# Patient Record
Sex: Female | Born: 2000
Health system: Southern US, Community
[De-identification: ages and names within clinical notes are randomized; demographics above are authoritative.]

## PROBLEM LIST (undated history)

## (undated) DIAGNOSIS — F419 Anxiety disorder, unspecified: Secondary | ICD-10-CM

## (undated) HISTORY — PX: FOOT SURGERY: SHX648

## (undated) HISTORY — PX: ADENOIDECTOMY: SHX5191

---
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
## 2021-01-30 ENCOUNTER — Ambulatory Visit (INDEPENDENT_AMBULATORY_CARE_PROVIDER_SITE_OTHER): Payer: BC Managed Care – PPO | Admitting: Psychology

## 2021-01-30 DIAGNOSIS — F4322 Adjustment disorder with anxiety: Secondary | ICD-10-CM | POA: Diagnosis not present

## 2021-02-24 ENCOUNTER — Ambulatory Visit: Payer: Self-pay | Admitting: Psychology

## 2021-03-08 ENCOUNTER — Ambulatory Visit (INDEPENDENT_AMBULATORY_CARE_PROVIDER_SITE_OTHER): Payer: BC Managed Care – PPO | Admitting: Psychology

## 2021-03-08 DIAGNOSIS — F4322 Adjustment disorder with anxiety: Secondary | ICD-10-CM | POA: Diagnosis not present

## 2021-04-05 ENCOUNTER — Ambulatory Visit (INDEPENDENT_AMBULATORY_CARE_PROVIDER_SITE_OTHER): Payer: BC Managed Care – PPO | Admitting: Psychology

## 2021-04-05 DIAGNOSIS — F4322 Adjustment disorder with anxiety: Secondary | ICD-10-CM | POA: Diagnosis not present

## 2021-05-03 ENCOUNTER — Ambulatory Visit (INDEPENDENT_AMBULATORY_CARE_PROVIDER_SITE_OTHER): Payer: BC Managed Care – PPO | Admitting: Psychology

## 2021-05-03 DIAGNOSIS — F4322 Adjustment disorder with anxiety: Secondary | ICD-10-CM | POA: Diagnosis not present

## 2021-05-31 ENCOUNTER — Ambulatory Visit (INDEPENDENT_AMBULATORY_CARE_PROVIDER_SITE_OTHER): Payer: BC Managed Care – PPO | Admitting: Psychology

## 2021-05-31 DIAGNOSIS — F4322 Adjustment disorder with anxiety: Secondary | ICD-10-CM

## 2021-05-31 NOTE — Progress Notes (Signed)
[facility] Counselor/Therapist Progress Note  Patient ID: Alyssa Lopez, MRN: [id number],    Date: 05/31/2021  Time Spent: 10:00am-10:45am   45 minutes   Treatment Type: Individual Therapy  Reported Symptoms: anxiety  Mental Status Exam: Appearance:  Casual     Behavior: Appropriate  Motor: Normal  Speech/Language:  Normal Rate  Affect: Appropriate  Mood: normal  Thought process: normal  Thought content:   WNL  Sensory/Perceptual disturbances:   WNL  Orientation: oriented to person, place, time/date, and situation  Attention: Good  Concentration: Good  Memory: WNL  Fund of knowledge:  Good  Insight:   Good  Judgment:  Good  Impulse Control: Good   Risk Assessment: Danger to Self:  No Self-injurious Behavior: No Danger to Others: No Duty to Warn:no Physical Aggression / Violence:No  Access to Firearms a concern: No  Gang Involvement:No   Subjective: Pt present for face-to-face individual therapy via Webex.   Pt consented to telehealth video session due to COVID 19 pandemic. Location of pt: home. Location of therapist: home office. Pt talked about school.  She is done for the semester.  Pt is happy with her grades which are all A's.  She has a month off for Christmas break.     Pt plans to relax and start going to the gym during break.   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   Pt talked about a social event where she had social anxiety.  She was with a different friends and went out to a club.   Addressed what pt experienced and helped with calming strategies.  Worked with pt on additional thought reframing and self care strategies.    Pt talked about being intentional about noticing the progress she is making.  She writes down her successes to remind herself of when she has times that she feels anxious.   Pt talked about her family's Christmas plans to visit family in [place].  Pt's father gets anxious about being with his parents.   It can be a stressful time.  Addressed how pt copes with the stress.     Provided supportive counseling.     Interventions: Cognitive Behavioral Therapy and Insight-Oriented  Diagnosis: F43.22  Plan: See pt's Treatment Plan for anxiety in Therapy Charts.  (Treatment Plan Target Date: 03/08/2022) Pt is progressing with treatment goals.   Plan to continue to see pt monthly.     , LCSW

## 2021-07-05 ENCOUNTER — Ambulatory Visit (INDEPENDENT_AMBULATORY_CARE_PROVIDER_SITE_OTHER): Payer: BC Managed Care – PPO | Admitting: Psychology

## 2021-07-05 DIAGNOSIS — F4322 Adjustment disorder with anxiety: Secondary | ICD-10-CM
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
## 2021-08-09 ENCOUNTER — Ambulatory Visit (INDEPENDENT_AMBULATORY_CARE_PROVIDER_SITE_OTHER): Payer: BC Managed Care – PPO | Admitting: Psychology

## 2021-08-09 DIAGNOSIS — F4322 Adjustment disorder with anxiety: Secondary | ICD-10-CM | POA: Diagnosis not present

## 2021-08-09 NOTE — Progress Notes (Signed)
[facility] Counselor/Therapist Progress Note  Patient ID: ACHSAH MCQUADE, MRN: [id number],    Date: 08/09/2021  Time Spent: 10:00am-10:45am   45 minutes   Treatment Type: Individual Therapy  Reported Symptoms: anxiety  Mental Status Exam: Appearance:  Casual     Behavior: Appropriate  Motor: Normal  Speech/Language:  Normal Rate  Affect: Appropriate  Mood: normal  Thought process: normal  Thought content:   WNL  Sensory/Perceptual disturbances:   WNL  Orientation: oriented to person, place, time/date, and situation  Attention: Good  Concentration: Good  Memory: WNL  Fund of knowledge:  Good  Insight:   Good  Judgment:  Good  Impulse Control: Good   Risk Assessment: Danger to Self:  No Self-injurious Behavior: No Danger to Others: No Duty to Warn:no Physical Aggression / Violence:No  Access to Firearms a concern: No  Gang Involvement:No   Subjective: Pt present for face-to-face individual therapy via Webex.   Pt consented to telehealth video session due to COVID 19 pandemic. Location of pt: home. Location of therapist: home office. Pt states she has been doing well.   School and work are going well.   Pt talked about feeling stressed bc she was chosen for jury duty.  She has felt anxious but is trying to not think about it to reduce the anticipatory anxiety.  Addressed pt's anxiety and worked on Regulatory affairs officer.   Pt talked about having her first doctor's appointment at her new doctor tomorrow and will go by herself so she is a little nervous about that.   Pt is use to her mother going with her when she went to the pediatrician and her mother would talk for her.  Addressed how pt can prepare for her appointment by writing things down ahead of time.  Pt talked about "adulting" and how she does not like it.   Worked with pt on thought reframing and self care strategies.    Provided supportive counseling.     Interventions: Cognitive Behavioral Therapy  and Insight-Oriented  Diagnosis: F43.22  Plan: See pt's Treatment Plan for anxiety in Therapy Charts.  (Treatment Plan Target Date: 03/08/2022) Pt is progressing toward treatment goals.   Plan to continue to see pt monthly.     , LCSW
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
## 2021-09-06 ENCOUNTER — Ambulatory Visit (INDEPENDENT_AMBULATORY_CARE_PROVIDER_SITE_OTHER): Payer: BC Managed Care – PPO | Admitting: Psychology

## 2021-09-06 DIAGNOSIS — F4322 Adjustment disorder with anxiety: Secondary | ICD-10-CM | POA: Diagnosis not present

## 2021-09-06 NOTE — Progress Notes (Signed)
[facility] Counselor/Therapist Progress Note ? ?Patient ID: Alyssa Lopez, MRN: [id number],   ? ?Date: 09/06/2021 ? ?Time Spent: 10:00am-10:45am   45 minutes  ? ?Treatment Type: Individual Therapy ? ?Reported Symptoms: anxiety ? ?Mental Status Exam: ?Appearance:  Casual     ?Behavior: Appropriate  ?Motor: Normal  ?Speech/Language:  Normal Rate  ?Affect: Appropriate  ?Mood: normal  ?Thought process: normal  ?Thought content:   WNL  ?Sensory/Perceptual disturbances:   WNL  ?Orientation: oriented to person, place, time/date, and situation  ?Attention: Good  ?Concentration: Good  ?Memory: WNL  ?Fund of knowledge:  Good  ?Insight:   Good  ?Judgment:  Good  ?Impulse Control: Good  ? ?Risk Assessment: ?Danger to Self:  No ?Self-injurious Behavior: No ?Danger to Others: No ?Duty to Warn:no ?Physical Aggression / Violence:No  ?Access to Firearms a concern: No  ?Gang Involvement:No  ? ?Subjective: Pt present for face-to-face individual therapy via Webex.   Pt consented to telehealth video session due to COVID 19 pandemic. ?Location of pt: home. ?Location of therapist: home office. ?Pt talked about going to the mountains with her friends.   She had a nice time and is being more social. ?Pt talked about work and school.   Both are going well.   ?Pt has felt anxious about trying to figure out her plans for her life.  She is not sure what she wants her major to be.  She knows she wants to work in the medical field but is not sure what area.   Pt may be interested in physical therapy or nursing.  Addressed pt's anxiety about figuring out what she wants to do.  Addressed that she is comparing herself to others and being too hard on herself.   ?Worked on calming strategies.   ?Worked with pt on thought reframing and self care strategies.    ?Provided supportive counseling.    ? ?Interventions: Cognitive Behavioral Therapy and Insight-Oriented ? ?Diagnosis: F43.22 ? ?Plan: See pt's Treatment Plan for anxiety in  Therapy Charts.  (Treatment Plan Target Date: 03/08/2022) ?Pt is progressing toward treatment goals.   ?Plan to continue to see pt monthly.   ? ? , LCSW ? ? ? ?

## 2021-10-04 ENCOUNTER — Ambulatory Visit (INDEPENDENT_AMBULATORY_CARE_PROVIDER_SITE_OTHER): Payer: BC Managed Care – PPO | Admitting: Psychology

## 2021-10-04 DIAGNOSIS — F4322 Adjustment disorder with anxiety: Secondary | ICD-10-CM | POA: Diagnosis not present

## 2021-10-04 NOTE — Progress Notes (Signed)
[facility] Counselor/Therapist Progress Note ? ?Patient ID: Alyssa Lopez, MRN: [id number],   ? ?Date: 10/04/2021 ? ?Time Spent: 10:00am-10:45am   45 minutes  ? ?Treatment Type: Individual Therapy ? ?Reported Symptoms: anxiety ? ?Mental Status Exam: ?Appearance:  Casual     ?Behavior: Appropriate  ?Motor: Normal  ?Speech/Language:  Normal Rate  ?Affect: Appropriate  ?Mood: normal  ?Thought process: normal  ?Thought content:   WNL  ?Sensory/Perceptual disturbances:   WNL  ?Orientation: oriented to person, place, time/date, and situation  ?Attention: Good  ?Concentration: Good  ?Memory: WNL  ?Fund of knowledge:  Good  ?Insight:   Good  ?Judgment:  Good  ?Impulse Control: Good  ? ?Risk Assessment: ?Danger to Self:  No ?Self-injurious Behavior: No ?Danger to Others: No ?Duty to Warn:no ?Physical Aggression / Violence:No  ?Access to Firearms a concern: No  ?Gang Involvement:No  ? ?Subjective: Pt present for face-to-face individual therapy via Webex.   Pt consented to telehealth video session due to COVID 19 pandemic. ?Location of pt: home. ?Location of therapist: home office. 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   They will work around her college schedule so she can still take classes.   ?Pt will work 7-3:30 4 days a week and then 7-12 one day a week.   Pt feels some anxiety about the new schedule.   Addressed pt's anxiety.    ?Worked on calming strategies.   ?Pt talked about having some periods of feeling "down".  It tends to happen when she has too much time on her own.  Addressed the triggers and how pt tends to be hard on herself.   ?Worked with pt on thought reframing and self care strategies.    ?Provided supportive counseling.    ? ?Interventions: Cognitive Behavioral Therapy and Insight-Oriented ? ?Diagnosis: F43.22 ? ?Plan: See pt's Treatment Plan for anxiety in Therapy Charts.  (Treatment Plan Target Date: 03/08/2022) ?Pt is progressing toward treatment  goals.   ?Plan to continue to see pt monthly.   ? ? , LCSW ? ? ? ?

## 2021-11-01 ENCOUNTER — Ambulatory Visit (INDEPENDENT_AMBULATORY_CARE_PROVIDER_SITE_OTHER): Payer: BC Managed Care – PPO | Admitting: Psychology

## 2021-11-01 DIAGNOSIS — F4322 Adjustment disorder with anxiety: Secondary | ICD-10-CM | POA: Diagnosis not present

## 2021-11-01 NOTE — Progress Notes (Signed)
[facility] Counselor/Therapist Progress Note ? ?Patient ID: Alyssa Lopez, MRN: [id number],   ? ?Date: 11/01/2021 ? ?Time Spent: 10:00am-10:45am   45 minutes  ? ?Treatment Type: Individual Therapy ? ?Reported Symptoms: anxiety ? ?Mental Status Exam: ?Appearance:  Casual     ?Behavior: Appropriate  ?Motor: Normal  ?Speech/Language:  Normal Rate  ?Affect: Appropriate  ?Mood: normal  ?Thought process: normal  ?Thought content:   WNL  ?Sensory/Perceptual disturbances:   WNL  ?Orientation: oriented to person, place, time/date, and situation  ?Attention: Good  ?Concentration: Good  ?Memory: WNL  ?Fund of knowledge:  Good  ?Insight:   Good  ?Judgment:  Good  ?Impulse Control: Good  ? ?Risk Assessment: ?Danger to Self:  No ?Self-injurious Behavior: No ?Danger to Others: No ?Duty to Warn:no ?Physical Aggression / Violence:No  ?Access to Firearms a concern: No  ?Gang Involvement:No  ? ?Subjective: Pt present for face-to-face individual therapy via Webex.   Pt consented to telehealth video session due to COVID 19 pandemic. ?Location of pt: home. ?Location of therapist: home office. ?Pt talked about her spring semester of college being over.  She did well and got all A's.   She starts summer classes next week.  She will take 2 classes.   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    Addressed pt's anxiety.   Worked on calming strategies.  Worked on thought reframing to address the anticipatory anxiety.   ?Pt is excited about summer.  She has a family trip to [place] in July.   Pt is excited to hang out with her friends at her pool this summer as well.   ?Pt talked about her relationship with her friends.  She hangs out with her sister and friends at times and pt feels like they just see her as "the little sister."   She is trying to establish her own friendships with them.    ?Pt talked about leaving her job at [employer].   She will miss the kids.  They are very attached to her and she has done a good job and is very valued there.  Addressed how pt will say goodbye to them.   ?Worked with pt on self care strategies.    ?Provided supportive counseling.    ? ?Interventions: Cognitive Behavioral Therapy and Insight-Oriented ? ?Diagnosis: F43.22 ? ?Plan: See pt's Treatment Plan for anxiety in Therapy Charts.  (Treatment Plan Target Date: 03/08/2022) ?Pt is progressing toward treatment goals.   ?Plan to continue to see pt monthly.   ? ? , LCSW ? ? ?

## 2021-11-29 ENCOUNTER — Ambulatory Visit: Payer: BC Managed Care – PPO | Admitting: Psychology

## 2021-12-11 ENCOUNTER — Ambulatory Visit (INDEPENDENT_AMBULATORY_CARE_PROVIDER_SITE_OTHER): Payer: BC Managed Care – PPO | Admitting: Psychology

## 2021-12-11 DIAGNOSIS — F4322 Adjustment disorder with anxiety: Secondary | ICD-10-CM | POA: Diagnosis not present

## 2022-01-08 ENCOUNTER — Ambulatory Visit (INDEPENDENT_AMBULATORY_CARE_PROVIDER_SITE_OTHER): Payer: BC Managed Care – PPO | Admitting: Psychology

## 2022-01-08 DIAGNOSIS — F4322 Adjustment disorder with anxiety: Secondary | ICD-10-CM

## 2022-01-08 NOTE — Progress Notes (Signed)
[facility] Counselor/Therapist Progress Note  Patient ID: Alyssa Lopez, MRN: [id number],    Date: 01/08/2022  Time Spent: 4:00pm-4:45pm     45 minutes   Treatment Type: Individual Therapy  Reported Symptoms: anxiety  Mental Status Exam: Appearance:  Casual     Behavior: Appropriate  Motor: Normal  Speech/Language:  Normal Rate  Affect: Appropriate  Mood: normal  Thought process: normal  Thought content:   WNL  Sensory/Perceptual disturbances:   WNL  Orientation: oriented to person, place, time/date, and situation  Attention: Good  Concentration: Good  Memory: WNL  Fund of knowledge:  Good  Insight:   Good  Judgment:  Good  Impulse Control: Good   Risk Assessment: Danger to Self:  No Self-injurious Behavior: No Danger to Others: No Duty to Warn:no Physical Aggression / Violence:No  Access to Firearms a concern: No  Gang Involvement:No   Subjective: Pt present for face-to-face individual therapy via Webex.   Pt consented to telehealth video session due to COVID 19 pandemic. Location of pt: home. Location of therapist: home office. Pt talked about work.  In general work has been going well. Pt has written her graduation speech and is glad that she did not avoid doing it bc of anxiety.   Pt is having a friend/coworker help with reviewing  editing her speech.  Pt still has some anticipatory anxiety about having to give the speech and get up in front of people and speak.   Worked on calming strategies for pt's anticipatory anxiety.   Pt has finished her summer classes and won't start fall classes until August.   Worked with pt on self care strategies.    Provided supportive counseling.     Interventions: Cognitive Behavioral Therapy and Insight-Oriented  Diagnosis: F43.22  Plan: See pt's Treatment Plan for anxiety in Therapy Charts.  (Treatment Plan Target Date: 03/08/2022) Pt is progressing toward treatment goals.   Plan to continue to see pt  monthly.     , LCSW

## 2022-01-15 ENCOUNTER — Encounter (HOSPITAL_BASED_OUTPATIENT_CLINIC_OR_DEPARTMENT_OTHER): Payer: Self-pay

## 2022-01-15 DIAGNOSIS — R1031 Right lower quadrant pain: Secondary | ICD-10-CM | POA: Insufficient documentation

## 2022-01-15 DIAGNOSIS — D72829 Elevated white blood cell count, unspecified: Secondary | ICD-10-CM | POA: Diagnosis not present

## 2022-01-15 LAB — COMPREHENSIVE METABOLIC PANEL
ALT: 18 U/L (ref 0–44)
AST: 17 U/L (ref 15–41)
Albumin: 4.4 g/dL (ref 3.5–5.0)
Alkaline Phosphatase: 100 U/L (ref 38–126)
Anion gap: 12 (ref 5–15)
BUN: 10 mg/dL (ref 6–20)
CO2: 24 mmol/L (ref 22–32)
Calcium: 9.5 mg/dL (ref 8.9–10.3)
Chloride: 101 mmol/L (ref 98–111)
Creatinine, Ser: 0.69 mg/dL (ref 0.44–1.00)
GFR, Estimated: >60 mL/min (ref >60)
Glucose, Bld: 93 mg/dL (ref 70–99)
Potassium: 3.6 mmol/L (ref 3.5–5.1)
Sodium: 137 mmol/L (ref 135–145)
Total Bilirubin: 0.8 mg/dL (ref 0.3–1.2)
Total Protein: 6.9 g/dL (ref 6.5–8.1)

## 2022-01-15 LAB — CBC
HCT: 35 % — ABNORMAL LOW (ref 36.0–46.0)
Hemoglobin: 11.4 g/dL — ABNORMAL LOW (ref 12.0–15.0)
MCH: 27.9 pg (ref 26.0–34.0)
MCHC: 32.6 g/dL (ref 30.0–36.0)
MCV: 85.6 fL (ref 80.0–100.0)
Platelets: 291 10*3/uL (ref 150–400)
RBC: 4.09 MIL/uL (ref 3.87–5.11)
RDW: 14.2 % (ref 11.5–15.5)
WBC: 14.7 10*3/uL — ABNORMAL HIGH (ref 4.0–10.5)
nRBC: 0 % (ref 0.0–0.2)

## 2022-01-15 LAB — LIPASE, BLOOD: Lipase: <10 U/L — ABNORMAL LOW (ref 11–51)

## 2022-01-15 NOTE — ED Triage Notes (Signed)
Pt reports RLQ pain x3 days. Reports developing a fever today. Denies diarrhea and emesis. Reports some nausea. Pt A&Ox4 at time of triage.

## 2022-01-16 ENCOUNTER — Emergency Department (HOSPITAL_BASED_OUTPATIENT_CLINIC_OR_DEPARTMENT_OTHER): Payer: BC Managed Care – PPO

## 2022-01-16 ENCOUNTER — Emergency Department (HOSPITAL_BASED_OUTPATIENT_CLINIC_OR_DEPARTMENT_OTHER)
Admission: EM | Admit: 2022-01-16 | Discharge: 2022-01-16 | Disposition: A | Discharge status: Home / Self Care | Payer: BC Managed Care – PPO | Attending: Emergency Medicine | Admitting: Emergency Medicine

## 2022-01-16 ENCOUNTER — Encounter (HOSPITAL_BASED_OUTPATIENT_CLINIC_OR_DEPARTMENT_OTHER): Payer: Self-pay | Admitting: Radiology

## 2022-01-16 DIAGNOSIS — N10 Acute tubulo-interstitial nephritis: Secondary | ICD-10-CM

## 2022-01-16 LAB — URINALYSIS, ROUTINE W REFLEX MICROSCOPIC
Bilirubin Urine: NEGATIVE
Glucose, UA: NEGATIVE mg/dL
Ketones, ur: 40 mg/dL — AB
Nitrite: POSITIVE — AB
Protein, ur: 100 mg/dL — AB
RBC / HPF: >50 RBC/hpf — ABNORMAL HIGH (ref 0–5)
Specific Gravity, Urine: 1.017 (ref 1.005–1.030)
WBC, UA: >50 WBC/hpf — ABNORMAL HIGH (ref 0–5)
pH: 5.5 (ref 5.0–8.0)

## 2022-01-16 LAB — PREGNANCY, URINE: Preg Test, Ur: NEGATIVE

## 2022-01-16 MED ORDER — ONDANSETRON HCL 4 MG/2ML IJ SOLN
4.0000 mg | Freq: Once | INTRAMUSCULAR | Status: AC
  Administered 2022-01-16: 4 mg via INTRAVENOUS
  Filled 2022-01-16: qty 2

## 2022-01-16 MED ORDER — CEPHALEXIN 500 MG PO CAPS: 500.0000 mg | ORAL_CAPSULE | Freq: Three times a day (TID) | ORAL | 0 refills | Status: DC

## 2022-01-16 MED ORDER — SODIUM CHLORIDE 0.9 % IV SOLN
1.0000 g | Freq: Once | INTRAVENOUS | Status: AC
  Administered 2022-01-16: 1 g via INTRAVENOUS
  Filled 2022-01-16: qty 10

## 2022-01-16 MED ORDER — SODIUM CHLORIDE 0.9 % IV BOLUS
1000.0000 mL | Freq: Once | INTRAVENOUS | Status: AC
  Administered 2022-01-16: 1000 mL via INTRAVENOUS

## 2022-01-16 MED ORDER — MORPHINE SULFATE (PF) 4 MG/ML IV SOLN
4.0000 mg | Freq: Once | INTRAVENOUS | Status: AC
  Administered 2022-01-16: 4 mg via INTRAVENOUS
  Filled 2022-01-16: qty 1

## 2022-01-16 MED ORDER — IOHEXOL 300 MG/ML  SOLN
100.0000 mL | Freq: Once | INTRAMUSCULAR | Status: AC | PRN
  Administered 2022-01-16: 80 mL via INTRAVENOUS

## 2022-01-16 NOTE — ED Provider Notes (Signed)
[facility] EMERGENCY DEPT Provider Note   CSN: [id number] Arrival date & time: 01/15/22  1926     History  Chief Complaint  Patient presents with   Abdominal Pain    Alyssa Lopez is a 21 y.o. female.  Patient is a 21 year old female with no significant past medical history.  Patient presenting today with complaints of right lower quadrant pain.  This started 2 days ago in the absence of any injury or trauma.  Pain has been constant and worsening.  It has improved somewhat with ibuprofen.  Earlier today, she started with a fever.  She denies any bowel or bladder complaints.  Last menstrual period was somewhat heavier than normal, but 2 weeks ago.  The history is provided by the patient.       Home Medications Prior to Admission medications   Medication Sig Start Date End Date Taking? Authorizing Provider  Clindamycin-Benzoyl Per, Refr, gel SMARTSIG:Sparingly Topical Every Morning 12/14/19   [provider]  escitalopram (LEXAPRO) 20 MG tablet Take 20 mg by mouth daily. 01/02/20   [provider]  hydrOXYzine (ATARAX/VISTARIL) 10 MG tablet Take 10 mg by mouth daily as needed. 10/14/19   [provider]  ibuprofen (ADVIL,MOTRIN) 100 MG chewable tablet Chew by mouth every 8 (eight) hours as needed.    [provider]  methocarbamol (ROBAXIN) 500 MG tablet Take 1 tablet (500 mg total) by mouth 2 (two) times daily. 04/12/20   Moshe Cipro, NP  SPRINTEC 28 0.25-35 MG-MCG tablet Take 1 tablet by mouth daily. 11/13/19   [provider]  tretinoin (RETIN-A) 0.05 % cream Apply 1 application topically at bedtime. 01/11/20   [provider]      Allergies    Patient has no known allergies.    Review of Systems   Review of Systems  All other systems reviewed and are negative.   Physical Exam Updated Vital Signs BP 113/78   Pulse 100   Temp 99.2 F (37.3 C)   Resp 18   Ht 5\' 2"  (1.575 m)   Wt 81.6 kg   LMP  01/09/2022   SpO2 100%   BMI 32.92 kg/m  Physical Exam Vitals and nursing note reviewed.  Constitutional:      General: She is not in acute distress.    Appearance: She is well-developed. She is not diaphoretic.  HENT:     Head: Normocephalic and atraumatic.  Cardiovascular:     Rate and Rhythm: Normal rate and regular rhythm.     Heart sounds: No murmur heard.    No friction rub. No gallop.  Pulmonary:     Effort: Pulmonary effort is normal. No respiratory distress.     Breath sounds: Normal breath sounds. No wheezing.  Abdominal:     General: Bowel sounds are normal. There is no distension.     Palpations: Abdomen is soft.     Tenderness: There is abdominal tenderness in the right lower quadrant. There is no right CVA tenderness, left CVA tenderness, guarding or rebound.  Musculoskeletal:        General: Normal range of motion.     Cervical back: Normal range of motion and neck supple.  Skin:    General: Skin is warm and dry.  Neurological:     General: No focal deficit present.     Mental Status: She is alert and oriented to person, place, and time.     ED Results / Procedures / Treatments   Labs (all  labs ordered are listed, but only abnormal results are displayed) Labs Reviewed  LIPASE, BLOOD - Abnormal; Notable for the following components:      Result Value   Lipase <10 (*)    All other components within normal limits  CBC - Abnormal; Notable for the following components:   WBC 14.7 (*)    Hemoglobin 11.4 (*)    HCT 35.0 (*)    All other components within normal limits  COMPREHENSIVE METABOLIC PANEL  URINALYSIS, ROUTINE W REFLEX MICROSCOPIC  PREGNANCY, URINE    EKG None  Radiology No results found.  Procedures Procedures    Medications Ordered in ED Medications  sodium chloride 0.9 % bolus 1,000 mL (has no administration in time range)  ondansetron (ZOFRAN) injection 4 mg (has no administration in time range)  morphine (PF) 4 MG/ML injection 4  mg (has no administration in time range)    ED Course/ Medical Decision Making/ A&P  This patient presents to the ED for concern of right lower quadrant pain, this involves an extensive number of treatment options, and is a complaint that carries with it a high risk of complications and morbidity.  The differential diagnosis includes acute appendicitis, tubo-ovarian abscess, ovarian torsion, urinary tract infection, ovarian cyst   Co morbidities that complicate the patient evaluation  None   Additional history obtained:  No additional history or external records needed   Lab Tests:  I Ordered, and personally interpreted labs.  The pertinent results include: Leukocytosis with white cell count of 15,000, but unremarkable metabolic panel.  Urinalysis is consistent with UTI.   Imaging Studies ordered:  I ordered imaging studies including CT scan of the abdomen and pelvis I independently visualized and interpreted imaging which showed no evidence for acute appendicitis, but does show evidence of inflammation around the right kidney suggestive of pyelonephritis I agree with the radiologist interpretation   Cardiac Monitoring: / EKG:  None performed   Consultations Obtained:  No consultations indicated   Problem List / ED Course / Critical interventions / Medication management  Patient presenting with right lower quadrant pain as described in the HPI.  Work-up reveals a leukocytosis, but CT is negative for appendicitis.  It does show evidence consistent with pyelonephritis and urinalysis consistent with UTI.  Patient to be treated with Rocephin and discharged with Keflex.  She is well-appearing and nontoxic and I believe can safely be discharged. I ordered medication including Rocephin for UTI and morphine and Zofran for pain and nausea Reevaluation of the patient after these medicines showed that the patient improved I have reviewed the patients home medicines and have made  adjustments as needed   Social Determinants of Health:  None   Test / Admission - Considered:  Patient to be discharged with Keflex and as needed return.  Final Clinical Impression(s) / ED Diagnoses Final diagnoses:  None    Rx / DC Orders ED Discharge Orders     None         Geoffery Lyons, MD 01/16/22 [phone]

## 2022-01-16 NOTE — ED Notes (Signed)
Pt verbalizes understanding of discharge instructions. Opportunity for questioning and answers were provided. Pt discharged from ED to home with mother.   ? ?

## 2022-01-16 NOTE — Discharge Instructions (Signed)
Begin taking Keflex as prescribed.  Drink plenty of fluids and get plenty of rest.  Return to the emergency department if you develop severe abdominal pain, high fevers, or for other new and concerning symptoms.

## 2022-02-05 ENCOUNTER — Ambulatory Visit (INDEPENDENT_AMBULATORY_CARE_PROVIDER_SITE_OTHER): Payer: BC Managed Care – PPO | Admitting: Psychology

## 2022-02-05 DIAGNOSIS — F4322 Adjustment disorder with anxiety: Secondary | ICD-10-CM

## 2022-02-05 NOTE — Progress Notes (Signed)
[facility] Counselor/Therapist Progress Note  Patient ID: REHMAT MURTAGH, MRN: [id number],    Date: 02/05/2022  Time Spent: 5:00pm-5:45pm     45 minutes   Treatment Type: Individual Therapy  Reported Symptoms: anxiety  Mental Status Exam: Appearance:  Casual     Behavior: Appropriate  Motor: Normal  Speech/Language:  Normal Rate  Affect: Appropriate  Mood: normal  Thought process: normal  Thought content:   WNL  Sensory/Perceptual disturbances:   WNL  Orientation: oriented to person, place, time/date, and situation  Attention: Good  Concentration: Good  Memory: WNL  Fund of knowledge:  Good  Insight:   Good  Judgment:  Good  Impulse Control: Good   Risk Assessment: Danger to Self:  No Self-injurious Behavior: No Danger to Others: No Duty to Warn:no Physical Aggression / Violence:No  Access to Firearms a concern: No  Gang Involvement:No   Subjective: Pt present for face-to-face individual therapy via Webex.   Pt consented to telehealth video session due to COVID 19 pandemic. Location of pt: home. Location of therapist: home office. Pt talked about work.  She had today off bc she worked the weekend.  Pt states work is going well.   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  Pt feels minimal anxiety about the test bc she tends to test well.   Pt started the fall semester.  Pt is taking English, Abnormal Psych, and Biology.  Pt feels like it will be a good semester.   Pt talked about giving her speech last week at graduation.   Pt did not have much anticipatory anxiety until the day of the speech.  This is good progress for pt.  She was very nervous the hour before her speech.  Pt did her speech and she got good feedback about it even though she did not feel like she did well.   Pt's parents were very proud of her.  Pt is proud of herself for doing the speech and not backing out.  Addressed how pt coped with the anxiety.  She coped by  doing deep breathing, adjusting her self talk, and reaching out to her mother for support.   Worked with pt on self care strategies.    Provided supportive counseling.     Interventions: Cognitive Behavioral Therapy and Insight-Oriented  Diagnosis: F43.22  Plan: See pt's Treatment Plan for anxiety in Therapy Charts.  (Treatment Plan Target Date: 03/08/2022) Pt is progressing toward treatment goals.   Plan to continue to see pt monthly.     , LCSW

## 2022-03-12 ENCOUNTER — Ambulatory Visit (INDEPENDENT_AMBULATORY_CARE_PROVIDER_SITE_OTHER): Payer: BC Managed Care – PPO | Admitting: Psychology

## 2022-03-12 DIAGNOSIS — F4322 Adjustment disorder with anxiety: Secondary | ICD-10-CM | POA: Diagnosis not present

## 2022-03-12 NOTE — Progress Notes (Signed)
[facility] Counselor Initial Adult Exam  Name: Alyssa Lopez Date: 03/12/2022 MRN: [id number] DOB: [birth year] PCP: Nelda Marseille, MD  Time spent: 5:00pm - 5:50pm   50 minutes  Guardian/Payee:  n/a    Paperwork requested: No   Reason for Visit /Presenting Problem: Pt present for face-to-face initial assessment update via video Webex.  Pt consents to telehealth video session due to COVID 19 pandemic. Location of pt: home Location of therapist: home office.  Pt has made progress managing her anxiety but still needs therapy to addressed issues as they come up and continue to learn additional coping skills.  Pt continues to have issues at times with anxiety and self esteem.   She is aware of her anxiety being triggered by doing things for the first time and doing new things. She experiences anticipatory anxiety.  Pt does not like being around new groups of people.   Reviewed pt's treatment plan for annual update.  Updated treatment plan and IA.  Plan to meet monthly.   Mental Status Exam: Appearance:   Casual     Behavior:  Appropriate  Motor:  Normal  Speech/Language:   Normal Rate  Affect:  Appropriate  Mood:  normal  Thought process:  normal  Thought content:    WNL  Sensory/Perceptual disturbances:    WNL  Orientation:  oriented to person, place, time/date, and situation  Attention:  Good  Concentration:  Good  Memory:  WNL  Fund of knowledge:   Good  Insight:    Good  Judgment:   Good  Impulse Control:  Good     Reported Symptoms:  stress  Risk Assessment: Danger to Self:  No Self-injurious Behavior: No Danger to Others: No Duty to Warn:no Physical Aggression / Violence:No  Access to Firearms a concern: No  Gang Involvement:No  Patient / guardian was educated about steps to take if suicide or homicide risk level increases between visits: n/a While future psychiatric events cannot be accurately predicted, the patient does not currently require  acute inpatient psychiatric care and does not currently meet [facility] involuntary commitment criteria.  Substance Abuse History: Current substance abuse: No     Past Psychiatric History:   No previous psychological problems have been observed Outpatient Providers:tpt has only worked with this provider.  History of Psych Hospitalization: No  Psychological Testing:  n/a    Abuse History:  Victim of: No.,  n/a    Report needed: No. Victim of Neglect:No. Perpetrator of  n/a   Witness / Exposure to Domestic Violence: No   Protective Services Involvement: No  Witness to [employer] Violence:  No   Family History:  Family History  Problem Relation Age of Onset   Cancer Maternal Grandmother    Cancer Maternal Grandfather     Living situation: the patient lives with their family  Family history of mental health issues: Pt's father's sister is bipolar.  Paternal grandparents have had anxiety.   No substance abuse or child abuse.    Sexual Orientation: Straight  Relationship Status: single  Name of spouse / other:n/a If a parent, number of children / ages:n/a  Support Systems: friends parents  Financial Stress:  No   Income/Employment/Disability: Employment and Supported by [employer] and [employer]  Financial planner: No   Educational History: Education: some college  Religion/Sprituality/World View: Protestant  Any cultural differences that may affect / interfere with treatment:  not applicable   Recreation/Hobbies: reading  Stressors: Occupational concerns    Strengths: Supportive Relationships,  Family, Friends, Hopefulness, Conservator, museum/gallery, and Able to Communicate Effectively  Barriers:  none   Legal History: Pending legal issue / charges: The patient has no significant history of legal issues. History of legal issue / charges:  n/a  Medical History/Surgical History: reviewed No past medical history on file.  Past Surgical History:  Procedure Laterality Date    ADENOIDECTOMY     FOOT SURGERY      Medications: Current Outpatient Medications  Medication Sig Dispense Refill   cephALEXin (KEFLEX) 500 MG capsule Take 1 capsule (500 mg total) by mouth 3 (three) times daily. 21 capsule 0   Clindamycin-Benzoyl Per, Refr, gel SMARTSIG:Sparingly Topical Every Morning     escitalopram (LEXAPRO) 20 MG tablet Take 20 mg by mouth daily.     hydrOXYzine (ATARAX/VISTARIL) 10 MG tablet Take 10 mg by mouth daily as needed.     ibuprofen (ADVIL,MOTRIN) 100 MG chewable tablet Chew by mouth every 8 (eight) hours as needed.     methocarbamol (ROBAXIN) 500 MG tablet Take 1 tablet (500 mg total) by mouth 2 (two) times daily. 20 tablet 0   SPRINTEC 28 0.25-35 MG-MCG tablet Take 1 tablet by mouth daily.     tretinoin (RETIN-A) 0.05 % cream Apply 1 application topically at bedtime.     No current facility-administered medications for this visit.    No Known Allergies  Diagnoses:  F43.22  Plan of Care: Recommend ongoing therapy.  Pt participated in setting treatment goals.  Plan to meet monthly.   Treatment Plan (treatment plan target date:  03/13/2023) Client Abilities/Strengths  Pt is bright, engaging and motivated for therapy.  Client Treatment Preferences  Individual therapy.  Client Statement of Needs  Improve coping skills.  Symptoms  Autonomic hyperactivity (e.g., palpitations, shortness of breath, dry mouth, trouble swallowing, nausea, diarrhea). Excessive and/or unrealistic worry that is difficult to control occurring more days than not for at least 6 months about a number of events or activities. Hypervigilance (e.g., feeling constantly on edge, experiencing concentration difficulties, having trouble falling or staying asleep, exhibiting a general state of irritability). Motor tension (e.g., restlessness, tiredness, shakiness, muscle tension). Problems Addressed  Anxiety Goals 1. Enhance ability to effectively cope with the full variety of life's  worries and anxieties. 2. Learn and implement coping skills that result in a reduction of anxiety and worry, and improved daily functioning. Objective Learn to accept limitations in life and commit to tolerating, rather than avoiding, unpleasant emotions while accomplishing meaningful goals. Target Date: 2023-03-13  Frequency: Monthly Progress: 50 Modality: individual Related Interventions 1. Use techniques from Acceptance and Commitment Therapy to help client accept uncomfortable realities such as lack of complete control, imperfections, and uncertainty and tolerate unpleasant emotions and thoughts in order to accomplish value-consistent goals. Objective Learn and implement problem-solving strategies for realistically addressing worries. Target Date: 2023-03-13  Frequency: Monthly Progress: 50 Modality: individual Related Interventions 1. Assign the client a homework exercise in which he/she problem-solves a current problem.  review, reinforce success, and provide corrective feedback toward improvement. 2. Teach the client problem-solving strategies involving specifically defining a problem, generating options for addressing it, evaluating the pros and cons of each option, selecting and implementing an optional action, and reevaluating and refining the action. Objective Learn and implement calming skills to reduce overall anxiety and manage anxiety symptoms. Target Date: 2023-03-13  Frequency: Monthly Progress: 50 Modality: individual Related Interventions 1. Assign the client to read about progressive muscle relaxation and other calming strategies in relevant books or treatment  manuals (e.g., Progressive Relaxation Training by Twana First; Mastery of Your Anxiety and Worry: Workbook by Earlie Counts). 2. Assign the client homework each session in which he/she practices relaxation exercises daily, gradually applying them progressively from non-anxiety-provoking to  anxiety-provoking situations; review and reinforce success while providing corrective feedback toward improvement. 3. Teach the client calming/relaxation skills (e.g., applied relaxation, progressive muscle relaxation, cue controlled relaxation; mindful breathing; biofeedback) and how to discriminate better between relaxation and tension; teach the client how to apply these skills to his/her daily life. 3. Reduce overall frequency, intensity, and duration of the anxiety so that daily functioning is not impaired. 4. Resolve the core conflict that is the source of anxiety. 5. Stabilize anxiety level while increasing ability to function on a daily basis. Diagnosis :    F43.22  Conditions For Discharge Achievement of treatment goals and objectives.       , LCSW

## 2022-03-13 ENCOUNTER — Ambulatory Visit: Payer: BC Managed Care – PPO | Admitting: Psychology

## 2022-03-13 DIAGNOSIS — F4322 Adjustment disorder with anxiety: Secondary | ICD-10-CM

## 2022-03-13 NOTE — Progress Notes (Signed)
[place] Counselor Initial Adult Exam  Name: Alyssa Lopez Date: 03/13/2022 MRN: [id number] DOB: 01-09-2001 PCP: Einar Gip, MD  Time spent: 5:00pm - 5:45pm   45 minutes  Guardian/Payee:  n/a    Paperwork requested: No   Reason for Visit /Presenting Problem: Pt present for face-to-face initial assessment update via video Webex.  Pt consents to telehealth video session due to COVID 19 pandemic. Location of pt: home Location of therapist: home office.  Pt continues to have issues with anxiety and self esteem.   She is aware of her anxiety being triggered by doing things for the first time and doing new things. She experiences anticipatory anxiety.  Pt does not like being around new groups of people.   Reviewed pt's treatment plan for annual update.   Updated treatment plan and IA.   Pt participated in setting treatment goals.   Plan to meet monthly.    Mental Status Exam: Appearance:   Casual     Behavior:  Appropriate  Motor:  Normal  Speech/Language:   Normal Rate  Affect:  Appropriate  Mood:  normal  Thought process:  normal  Thought content:    WNL  Sensory/Perceptual disturbances:    WNL  Orientation:  oriented to person, place, time/date, and situation  Attention:  Good  Concentration:  Good  Memory:  WNL  Fund of knowledge:   Good  Insight:    Good  Judgment:   Good  Impulse Control:  Good     Reported Symptoms:  anxiety  Risk Assessment: Danger to Self:  No Self-injurious Behavior: No Danger to Others: No Duty to Warn:no Physical Aggression / Violence:No  Access to Firearms a concern: No  Gang Involvement:No  Patient / guardian was educated about steps to take if suicide or homicide risk level increases between visits: n/a While future psychiatric events cannot be accurately predicted, the patient does not currently require acute inpatient psychiatric care and does not currently meet [facility] involuntary commitment  criteria.  Substance Abuse History: Current substance abuse: No     Past Psychiatric History:   Previous psychological history is significant for anxiety Outpatient Providers: , LCSW History of Psych Hospitalization: No  Psychological Testing:  n/a    Abuse History:  Victim of: No.,  n/a    Report needed: No. Victim of Neglect:No. Perpetrator of  n/a   Witness / Exposure to Domestic Violence: No   Protective Services Involvement: No  Witness to [employer] Violence:  No   Family History:  Family History  Problem Relation Age of Onset   Cancer Maternal Grandmother    Cancer Maternal Grandfather     Living situation: the patient lives with her family  Family history of mental health issues: Pt's father's sister is bipolar.  Paternal grandparents have had anxiety.   No substance abuse or child abuse.    Sexual Orientation: Straight  Relationship Status: single  Name of spouse / other:n/a If a parent, number of children / ages:n/a  Support Systems: friends parents  Financial Stress:  No   Income/Employment/Disability: Employment and Supported by [employer] and [employer]  Armed forces logistics/support/administrative officer: No   Educational History: Education: some college  Religion/Sprituality/World View: Protestant  Any cultural differences that may affect / interfere with treatment:  not applicable   Recreation/Hobbies: reading  Stressors: Other: anxiety    Strengths: Supportive Relationships, Family, Friends, Conservator, museum/gallery, and Able to Communicate Effectively  Barriers:  none   Legal History: Pending legal issue / charges:  The patient has no significant history of legal issues. History of legal issue / charges:  n/a  Medical History/Surgical History: reviewed No past medical history on file.  Past Surgical History:  Procedure Laterality Date   ADENOIDECTOMY     FOOT SURGERY      Medications: Current Outpatient Medications  Medication Sig Dispense Refill   cephALEXin  (KEFLEX) 500 MG capsule Take 1 capsule (500 mg total) by mouth 3 (three) times daily. 21 capsule 0   Clindamycin-Benzoyl Per, Refr, gel SMARTSIG:Sparingly Topical Every Morning     escitalopram (LEXAPRO) 20 MG tablet Take 20 mg by mouth daily.     hydrOXYzine (ATARAX/VISTARIL) 10 MG tablet Take 10 mg by mouth daily as needed.     ibuprofen (ADVIL,MOTRIN) 100 MG chewable tablet Chew by mouth every 8 (eight) hours as needed.     methocarbamol (ROBAXIN) 500 MG tablet Take 1 tablet (500 mg total) by mouth 2 (two) times daily. 20 tablet 0   SPRINTEC 28 0.25-35 MG-MCG tablet Take 1 tablet by mouth daily.     tretinoin (RETIN-A) 0.05 % cream Apply 1 application topically at bedtime.     No current facility-administered medications for this visit.    No Known Allergies  Diagnoses:  F43.22  Plan of Care: Recommended ongoing therapy.  Pt participated in setting treatment goals.   Plan to meet monthly.  Treatment Plan (Treatment Plan target date:  03/13/2023) Client Abilities/Strengths  Pt is bright, engaging and motivated for therapy.  Client Treatment Preferences  Individual therapy.  Client Statement of Needs  Improve coping skills.  Symptoms  Autonomic hyperactivity (e.g., palpitations, shortness of breath, dry mouth, trouble swallowing, nausea, diarrhea). Excessive and/or unrealistic worry that is difficult to control occurring more days than not for at least 6 months about a number of events or activities. Hypervigilance (e.g., feeling constantly on edge, experiencing concentration difficulties, having trouble falling or staying asleep, exhibiting a general state of irritability). Motor tension (e.g., restlessness, tiredness, shakiness, muscle tension). Problems Addressed  Anxiety Goals 1. Enhance ability to effectively cope with the full variety of life's worries and anxieties. 2. Learn and implement coping skills that result in a reduction of anxiety and worry, and improved daily  functioning. Objective Learn to accept limitations in life and commit to tolerating, rather than avoiding, unpleasant emotions while accomplishing meaningful goals. Target Date: 2023-03-13  Frequency: Monthly Progress: 50 Modality: individual Related Interventions 1. Use techniques from Acceptance and Commitment Therapy to help client accept uncomfortable realities such as lack of complete control, imperfections, and uncertainty and tolerate unpleasant emotions and thoughts in order to accomplish value-consistent goals. Objective Learn and implement problem-solving strategies for realistically addressing worries. Target Date: 2023-03-13  Frequency: Monthly Progress: 50 Modality: individual Related Interventions 1. Assign the client a homework exercise in which he/she problem-solves a current problem.  review, reinforce success, and provide corrective feedback toward improvement. 2. Teach the client problem-solving strategies involving specifically defining a problem, generating options for addressing it, evaluating the pros and cons of each option, selecting and implementing an optional action, and reevaluating and refining the action. Objective Learn and implement calming skills to reduce overall anxiety and manage anxiety symptoms. Target Date: 2023-03-13  Frequency: Monthly Progress: 50 Modality: individual Related Interventions 1. Assign the client to read about progressive muscle relaxation and other calming strategies in relevant books or treatment manuals (e.g., Progressive Relaxation Training by Gwynneth Aliment and Dani Gobble; Mastery of Your Anxiety and Worry: Workbook by Beckie Busing). 2. Assign the  client homework each session in which he/she practices relaxation exercises daily, gradually applying them progressively from non-anxiety-provoking to anxiety-provoking situations; review and reinforce success while providing corrective feedback toward improvement. 3. Teach the client  calming/relaxation skills (e.g., applied relaxation, progressive muscle relaxation, cue controlled relaxation; mindful breathing; biofeedback) and how to discriminate better between relaxation and tension; teach the client how to apply these skills to his/her daily life. 3. Reduce overall frequency, intensity, and duration of the anxiety so that daily functioning is not impaired. 4. Resolve the core conflict that is the source of anxiety. 5. Stabilize anxiety level while increasing ability to function on a daily basis. Diagnosis :    F43.22  Conditions For Discharge Achievement of treatment goals and objectives.       , LCSW

## 2022-04-11 ENCOUNTER — Ambulatory Visit (INDEPENDENT_AMBULATORY_CARE_PROVIDER_SITE_OTHER): Payer: BC Managed Care – PPO | Admitting: Psychology

## 2022-04-11 DIAGNOSIS — F4322 Adjustment disorder with anxiety: Secondary | ICD-10-CM | POA: Diagnosis not present

## 2022-04-11 NOTE — Progress Notes (Signed)
[facility] Counselor/Therapist Progress Note  Patient ID: Alyssa Lopez, MRN: [id number],    Date: 04/11/2022  Time Spent: 5:00pm - 5:45pm   45 minutes   Treatment Type: Individual Therapy  Reported Symptoms: stress  Mental Status Exam: Appearance:  Casual     Behavior: Appropriate  Motor: Normal  Speech/Language:  Normal Rate  Affect: Appropriate  Mood: normal  Thought process: normal  Thought content:   WNL  Sensory/Perceptual disturbances:   WNL  Orientation: oriented to person, place, time/date, and situation  Attention: Good  Concentration: Good  Memory: WNL  Fund of knowledge:  Good  Insight:   Good  Judgment:  Good  Impulse Control: Good   Risk Assessment: Danger to Self:  No Self-injurious Behavior: No Danger to Others: No Duty to Warn:no Physical Aggression / Violence:No  Access to Firearms a concern: No  Gang Involvement:No   Subjective: Pt present for face-to-face individual therapy via video Webex.  Pt consents to telehealth video session due to COVID 19 pandemic. Location of pt: home Location of therapist: home office.  Pt talked about being tired from work.   She has started to go to the gym and exercise so she can get healthier and relieve stress.   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  Pt is proud of herself and is relieved to have that stress off of her.   Pt was anxious anticipating he exam but she managed with using her coping skills.   Encouraged pt to acknowledge herself for the progress she is making.   Provided supportive therapy.      Interventions: Cognitive Behavioral Therapy and Insight-Oriented  Diagnosis:  F43.22  Plan of Care: Recommend ongoing therapy.  Pt participated in setting treatment goals.  Plan to meet monthly.   Treatment Plan (treatment plan target date:  03/13/2023) Client Abilities/Strengths  Pt is bright, engaging and motivated for therapy.  Client Treatment  Preferences  Individual therapy.  Client Statement of Needs  Improve coping skills.  Symptoms  Autonomic hyperactivity (e.g., palpitations, shortness of breath, dry mouth, trouble swallowing, nausea, diarrhea). Excessive and/or unrealistic worry that is difficult to control occurring more days than not for at least 6 months about a number of events or activities. Hypervigilance (e.g., feeling constantly on edge, experiencing concentration difficulties, having trouble falling or staying asleep, exhibiting a general state of irritability). Motor tension (e.g., restlessness, tiredness, shakiness, muscle tension). Problems Addressed  Anxiety Goals 1. Enhance ability to effectively cope with the full variety of life's worries and anxieties. 2. Learn and implement coping skills that result in a reduction of anxiety and worry, and improved daily functioning. Objective Learn to accept limitations in life and commit to tolerating, rather than avoiding, unpleasant emotions while accomplishing meaningful goals. Target Date: 2023-03-13  Frequency: Monthly Progress: 50 Modality: individual Related Interventions 1. Use techniques from Acceptance and Commitment Therapy to help client accept uncomfortable realities such as lack of complete control, imperfections, and uncertainty and tolerate unpleasant emotions and thoughts in order to accomplish value-consistent goals. Objective Learn and implement problem-solving strategies for realistically addressing worries. Target Date: 2023-03-13  Frequency: Monthly Progress: 50 Modality: individual Related Interventions 1. Assign the client a homework exercise in which he/she problem-solves a current problem.  review, reinforce success, and provide corrective feedback toward improvement. 2. Teach the client problem-solving strategies involving specifically defining a problem, generating options for addressing it, evaluating the pros and cons of each option, selecting  and implementing an optional action, and reevaluating and refining the action. Objective Learn and implement calming skills to reduce overall anxiety and manage anxiety symptoms. Target Date: 2023-03-13  Frequency: Monthly Progress: 50 Modality: individual Related Interventions 1. Assign the client to read about progressive muscle relaxation and other calming strategies in relevant books or treatment manuals (e.g., Progressive Relaxation Training by Gwynneth Aliment and Dani Gobble; Mastery of Your Anxiety and Worry: Workbook by Beckie Busing). 2. Assign the client homework each session in which he/she practices relaxation exercises daily, gradually applying them progressively from non-anxiety-provoking to anxiety-provoking situations; review and reinforce success while providing corrective feedback toward improvement. 3. Teach the client calming/relaxation skills (e.g., applied relaxation, progressive muscle relaxation, cue controlled relaxation; mindful breathing; biofeedback) and how to discriminate better between relaxation and tension; teach the client how to apply these skills to his/her daily life. 3. Reduce overall frequency, intensity, and duration of the anxiety so that daily functioning is not impaired. 4. Resolve the core conflict that is the source of anxiety. 5. Stabilize anxiety level while increasing ability to function on a daily basis. Diagnosis :    F43.22  Conditions For Discharge Achievement of treatment goals and objectives.   , LCSW

## 2022-05-08 ENCOUNTER — Ambulatory Visit (INDEPENDENT_AMBULATORY_CARE_PROVIDER_SITE_OTHER): Payer: BC Managed Care – PPO | Admitting: Psychology

## 2022-05-08 DIAGNOSIS — F4322 Adjustment disorder with anxiety: Secondary | ICD-10-CM | POA: Diagnosis not present

## 2022-05-08 NOTE — Progress Notes (Signed)
[place] Counselor/Therapist Progress Note  Patient ID: Alyssa Lopez, MRN: [id number],    Date: 05/08/2022  Time Spent: 5:00pm - 5:45pm   45 minutes   Treatment Type: Individual Therapy  Reported Symptoms: stress  Mental Status Exam: Appearance:  Casual     Behavior: Appropriate  Motor: Normal  Speech/Language:  Normal Rate  Affect: Appropriate  Mood: normal  Thought process: normal  Thought content:   WNL  Sensory/Perceptual disturbances:   WNL  Orientation: oriented to person, place, time/date, and situation  Attention: Good  Concentration: Good  Memory: WNL  Fund of knowledge:  Good  Insight:   Good  Judgment:  Good  Impulse Control: Good   Risk Assessment: Danger to Self:  No Self-injurious Behavior: No Danger to Others: No Duty to Warn:no Physical Aggression / Violence:No  Access to Firearms a concern: No  Gang Involvement:No   Subjective: Pt present for face-to-face individual therapy via video Webex.  Pt consents to telehealth video session due to COVID 19 pandemic. Location of pt: home Location of therapist: home office.  Pt talked about her plans for the holidays.   She will be with friends and family.   Pt states she is relieved that this semester of school is almost over.  She has been getting good grades.  Pt states work has been going well.  Pt is feeling very comfortable there and she is not holding herself back in situations.  Pt feels a lot more comfortable socially.   Pt has not had any major anxiety lately.   Pt attributes this partly to her efforts to not being as negative toward herself as she use to be.  Encouraged pt to acknowledge herself for the progress she is making.   Provided supportive therapy.      Interventions: Cognitive Behavioral Therapy and Insight-Oriented  Diagnosis:  F43.22  Plan of Care: Recommend ongoing therapy.  Pt participated in setting treatment goals.  Plan to meet monthly.   Treatment Plan  (treatment plan target date:  03/13/2023) Client Abilities/Strengths  Pt is bright, engaging and motivated for therapy.  Client Treatment Preferences  Individual therapy.  Client Statement of Needs  Improve coping skills.  Symptoms  Autonomic hyperactivity (e.g., palpitations, shortness of breath, dry mouth, trouble swallowing, nausea, diarrhea). Excessive and/or unrealistic worry that is difficult to control occurring more days than not for at least 6 months about a number of events or activities. Hypervigilance (e.g., feeling constantly on edge, experiencing concentration difficulties, having trouble falling or staying asleep, exhibiting a general state of irritability). Motor tension (e.g., restlessness, tiredness, shakiness, muscle tension). Problems Addressed  Anxiety Goals 1. Enhance ability to effectively cope with the full variety of life's worries and anxieties. 2. Learn and implement coping skills that result in a reduction of anxiety and worry, and improved daily functioning. Objective Learn to accept limitations in life and commit to tolerating, rather than avoiding, unpleasant emotions while accomplishing meaningful goals. Target Date: 2023-03-13  Frequency: Monthly Progress: 50 Modality: individual Related Interventions 1. Use techniques from Acceptance and Commitment Therapy to help client accept uncomfortable realities such as lack of complete control, imperfections, and uncertainty and tolerate unpleasant emotions and thoughts in order to accomplish value-consistent goals. Objective Learn and implement problem-solving strategies for realistically addressing worries. Target Date: 2023-03-13  Frequency: Monthly Progress: 50 Modality: individual Related Interventions 1. Assign the client a homework exercise in which he/she problem-solves a current problem.  review, reinforce success, and provide corrective feedback toward improvement.  2. Teach the client problem-solving  strategies involving specifically defining a problem, generating options for addressing it, evaluating the pros and cons of each option, selecting and implementing an optional action, and reevaluating and refining the action. Objective Learn and implement calming skills to reduce overall anxiety and manage anxiety symptoms. Target Date: 2023-03-13  Frequency: Monthly Progress: 50 Modality: individual Related Interventions 1. Assign the client to read about progressive muscle relaxation and other calming strategies in relevant books or treatment manuals (e.g., Progressive Relaxation Training by Robb Matar and Alen Blew; Mastery of Your Anxiety and Worry: Workbook by Earlie Counts). 2. Assign the client homework each session in which he/she practices relaxation exercises daily, gradually applying them progressively from non-anxiety-provoking to anxiety-provoking situations; review and reinforce success while providing corrective feedback toward improvement. 3. Teach the client calming/relaxation skills (e.g., applied relaxation, progressive muscle relaxation, cue controlled relaxation; mindful breathing; biofeedback) and how to discriminate better between relaxation and tension; teach the client how to apply these skills to his/her daily life. 3. Reduce overall frequency, intensity, and duration of the anxiety so that daily functioning is not impaired. 4. Resolve the core conflict that is the source of anxiety. 5. Stabilize anxiety level while increasing ability to function on a daily basis. Diagnosis :    F43.22  Conditions For Discharge Achievement of treatment goals and objectives.   , LCSW

## 2022-05-31 ENCOUNTER — Inpatient Hospital Stay (HOSPITAL_COMMUNITY)
Admission: EM | Admit: 2022-05-31 | Discharge: 2022-06-22 | DRG: 956 | Disposition: A | Discharge status: Rehabilitation Facility | Payer: BC Managed Care – PPO | Attending: Surgery | Admitting: Surgery

## 2022-05-31 ENCOUNTER — Inpatient Hospital Stay (HOSPITAL_COMMUNITY): Payer: BC Managed Care – PPO

## 2022-05-31 ENCOUNTER — Encounter (HOSPITAL_COMMUNITY): Admission: EM | Disposition: A | Discharge status: Rehabilitation Facility | Payer: Self-pay | Source: Home / Self Care

## 2022-05-31 ENCOUNTER — Inpatient Hospital Stay (HOSPITAL_COMMUNITY): Payer: BC Managed Care – PPO | Admitting: Certified Registered"

## 2022-05-31 ENCOUNTER — Inpatient Hospital Stay: Payer: Self-pay

## 2022-05-31 ENCOUNTER — Emergency Department (HOSPITAL_COMMUNITY): Payer: BC Managed Care – PPO | Admitting: Anesthesiology

## 2022-05-31 ENCOUNTER — Emergency Department (HOSPITAL_COMMUNITY): Payer: BC Managed Care – PPO

## 2022-05-31 DIAGNOSIS — M23301 Other meniscus derangements, unspecified lateral meniscus, left knee: Secondary | ICD-10-CM | POA: Diagnosis present

## 2022-05-31 DIAGNOSIS — Z79899 Other long term (current) drug therapy: Secondary | ICD-10-CM

## 2022-05-31 DIAGNOSIS — D62 Acute posthemorrhagic anemia: Secondary | ICD-10-CM | POA: Diagnosis present

## 2022-05-31 DIAGNOSIS — E872 Acidosis, unspecified: Secondary | ICD-10-CM | POA: Diagnosis present

## 2022-05-31 DIAGNOSIS — J9601 Acute respiratory failure with hypoxia: Secondary | ICD-10-CM | POA: Diagnosis present

## 2022-05-31 DIAGNOSIS — S32811A Multiple fractures of pelvis with unstable disruption of pelvic ring, initial encounter for closed fracture: Secondary | ICD-10-CM | POA: Diagnosis present

## 2022-05-31 DIAGNOSIS — S32059A Unspecified fracture of fifth lumbar vertebra, initial encounter for closed fracture: Secondary | ICD-10-CM | POA: Diagnosis present

## 2022-05-31 DIAGNOSIS — K5901 Slow transit constipation: Secondary | ICD-10-CM | POA: Diagnosis not present

## 2022-05-31 DIAGNOSIS — S32049A Unspecified fracture of fourth lumbar vertebra, initial encounter for closed fracture: Secondary | ICD-10-CM | POA: Diagnosis present

## 2022-05-31 DIAGNOSIS — I959 Hypotension, unspecified: Secondary | ICD-10-CM | POA: Diagnosis present

## 2022-05-31 DIAGNOSIS — I420 Dilated cardiomyopathy: Secondary | ICD-10-CM | POA: Diagnosis not present

## 2022-05-31 DIAGNOSIS — E870 Hyperosmolality and hypernatremia: Secondary | ICD-10-CM | POA: Diagnosis not present

## 2022-05-31 DIAGNOSIS — K449 Diaphragmatic hernia without obstruction or gangrene: Secondary | ICD-10-CM | POA: Diagnosis present

## 2022-05-31 DIAGNOSIS — S82001C Unspecified fracture of right patella, initial encounter for open fracture type IIIA, IIIB, or IIIC: Secondary | ICD-10-CM | POA: Diagnosis present

## 2022-05-31 DIAGNOSIS — S8002XA Contusion of left knee, initial encounter: Secondary | ICD-10-CM | POA: Diagnosis present

## 2022-05-31 DIAGNOSIS — T07XXXA Unspecified multiple injuries, initial encounter: Secondary | ICD-10-CM | POA: Diagnosis not present

## 2022-05-31 DIAGNOSIS — S3210XA Unspecified fracture of sacrum, initial encounter for closed fracture: Secondary | ICD-10-CM | POA: Diagnosis present

## 2022-05-31 DIAGNOSIS — S72352C Displaced comminuted fracture of shaft of left femur, initial encounter for open fracture type IIIA, IIIB, or IIIC: Secondary | ICD-10-CM | POA: Diagnosis present

## 2022-05-31 DIAGNOSIS — Z23 Encounter for immunization: Secondary | ICD-10-CM | POA: Diagnosis not present

## 2022-05-31 DIAGNOSIS — Z6841 Body Mass Index (BMI) 40.0 and over, adult: Secondary | ICD-10-CM | POA: Diagnosis not present

## 2022-05-31 DIAGNOSIS — S52611A Displaced fracture of right ulna styloid process, initial encounter for closed fracture: Secondary | ICD-10-CM | POA: Diagnosis present

## 2022-05-31 DIAGNOSIS — G8911 Acute pain due to trauma: Secondary | ICD-10-CM | POA: Diagnosis present

## 2022-05-31 DIAGNOSIS — M545 Low back pain, unspecified: Secondary | ICD-10-CM | POA: Diagnosis not present

## 2022-05-31 DIAGNOSIS — S32029A Unspecified fracture of second lumbar vertebra, initial encounter for closed fracture: Secondary | ICD-10-CM | POA: Diagnosis present

## 2022-05-31 DIAGNOSIS — S32039A Unspecified fracture of third lumbar vertebra, initial encounter for closed fracture: Secondary | ICD-10-CM | POA: Diagnosis present

## 2022-05-31 DIAGNOSIS — E876 Hypokalemia: Secondary | ICD-10-CM | POA: Diagnosis not present

## 2022-05-31 DIAGNOSIS — Y92410 Unspecified street and highway as the place of occurrence of the external cause: Secondary | ICD-10-CM

## 2022-05-31 DIAGNOSIS — S52022A Displaced fracture of olecranon process without intraarticular extension of left ulna, initial encounter for closed fracture: Secondary | ICD-10-CM | POA: Diagnosis present

## 2022-05-31 DIAGNOSIS — M79671 Pain in right foot: Secondary | ICD-10-CM | POA: Diagnosis not present

## 2022-05-31 DIAGNOSIS — R197 Diarrhea, unspecified: Secondary | ICD-10-CM | POA: Diagnosis not present

## 2022-05-31 DIAGNOSIS — S32019A Unspecified fracture of first lumbar vertebra, initial encounter for closed fracture: Secondary | ICD-10-CM | POA: Diagnosis present

## 2022-05-31 DIAGNOSIS — R52 Pain, unspecified: Secondary | ICD-10-CM | POA: Diagnosis not present

## 2022-05-31 DIAGNOSIS — F411 Generalized anxiety disorder: Secondary | ICD-10-CM | POA: Diagnosis not present

## 2022-05-31 DIAGNOSIS — S52571A Other intraarticular fracture of lower end of right radius, initial encounter for closed fracture: Secondary | ICD-10-CM | POA: Diagnosis present

## 2022-05-31 DIAGNOSIS — M79672 Pain in left foot: Secondary | ICD-10-CM | POA: Diagnosis not present

## 2022-05-31 DIAGNOSIS — S12600A Unspecified displaced fracture of seventh cervical vertebra, initial encounter for closed fracture: Secondary | ICD-10-CM | POA: Diagnosis present

## 2022-05-31 DIAGNOSIS — R7401 Elevation of levels of liver transaminase levels: Secondary | ICD-10-CM | POA: Diagnosis not present

## 2022-05-31 DIAGNOSIS — S27808A Other injury of diaphragm, initial encounter: Secondary | ICD-10-CM | POA: Diagnosis present

## 2022-05-31 DIAGNOSIS — R609 Edema, unspecified: Secondary | ICD-10-CM | POA: Diagnosis not present

## 2022-05-31 DIAGNOSIS — S92301A Fracture of unspecified metatarsal bone(s), right foot, initial encounter for closed fracture: Secondary | ICD-10-CM | POA: Diagnosis present

## 2022-05-31 DIAGNOSIS — M25562 Pain in left knee: Secondary | ICD-10-CM | POA: Diagnosis present

## 2022-05-31 DIAGNOSIS — Z975 Presence of (intrauterine) contraceptive device: Secondary | ICD-10-CM

## 2022-05-31 DIAGNOSIS — E669 Obesity, unspecified: Secondary | ICD-10-CM | POA: Diagnosis present

## 2022-05-31 DIAGNOSIS — J189 Pneumonia, unspecified organism: Secondary | ICD-10-CM | POA: Diagnosis not present

## 2022-05-31 DIAGNOSIS — S92301D Fracture of unspecified metatarsal bone(s), right foot, subsequent encounter for fracture with routine healing: Secondary | ICD-10-CM | POA: Diagnosis not present

## 2022-05-31 DIAGNOSIS — S7400XS Injury of sciatic nerve at hip and thigh level, unspecified leg, sequela: Secondary | ICD-10-CM | POA: Diagnosis not present

## 2022-05-31 HISTORY — PX: SACRO-ILIAC PINNING: SHX5050

## 2022-05-31 HISTORY — PX: LAPAROTOMY: SHX154

## 2022-05-31 HISTORY — PX: ORIF PATELLA: SHX5033

## 2022-05-31 HISTORY — PX: FEMUR IM NAIL: SHX1597

## 2022-05-31 LAB — CBC
HCT: 38.1 % (ref 36.0–46.0)
HCT: 35.7 % — ABNORMAL LOW (ref 36.0–46.0)
HCT: 32.6 % — ABNORMAL LOW (ref 36.0–46.0)
Hemoglobin: 12.1 g/dL (ref 12.0–15.0)
Hemoglobin: 12.1 g/dL (ref 12.0–15.0)
Hemoglobin: 10.6 g/dL — ABNORMAL LOW (ref 12.0–15.0)
MCH: 27.6 pg (ref 26.0–34.0)
MCH: 28.7 pg (ref 26.0–34.0)
MCH: 28 pg (ref 26.0–34.0)
MCHC: 31.8 g/dL (ref 30.0–36.0)
MCHC: 33.9 g/dL (ref 30.0–36.0)
MCHC: 32.5 g/dL (ref 30.0–36.0)
MCV: 87 fL (ref 80.0–100.0)
MCV: 84.8 fL (ref 80.0–100.0)
MCV: 86 fL (ref 80.0–100.0)
Platelets: 488 10*3/uL — ABNORMAL HIGH (ref 150–400)
Platelets: 277 10*3/uL (ref 150–400)
Platelets: 224 10*3/uL (ref 150–400)
RBC: 4.38 MIL/uL (ref 3.87–5.11)
RBC: 4.21 MIL/uL (ref 3.87–5.11)
RBC: 3.79 MIL/uL — ABNORMAL LOW (ref 3.87–5.11)
RDW: 13.7 % (ref 11.5–15.5)
RDW: 14.6 % (ref 11.5–15.5)
RDW: 14.8 % (ref 11.5–15.5)
WBC: 32.7 10*3/uL — ABNORMAL HIGH (ref 4.0–10.5)
WBC: 22.6 10*3/uL — ABNORMAL HIGH (ref 4.0–10.5)
WBC: 15.3 10*3/uL — ABNORMAL HIGH (ref 4.0–10.5)
nRBC: 0 % (ref 0.0–0.2)
nRBC: 0 % (ref 0.0–0.2)
nRBC: 0 % (ref 0.0–0.2)

## 2022-05-31 LAB — POCT I-STAT 7, (LYTES, BLD GAS, ICA,H+H)
Acid-base deficit: 8 mmol/L — ABNORMAL HIGH (ref 0.0–2.0)
Acid-base deficit: 5 mmol/L — ABNORMAL HIGH (ref 0.0–2.0)
Acid-base deficit: 6 mmol/L — ABNORMAL HIGH (ref 0.0–2.0)
Acid-base deficit: 5 mmol/L — ABNORMAL HIGH (ref 0.0–2.0)
Acid-base deficit: 5 mmol/L — ABNORMAL HIGH (ref 0.0–2.0)
Acid-base deficit: 4 mmol/L — ABNORMAL HIGH (ref 0.0–2.0)
Acid-base deficit: 4 mmol/L — ABNORMAL HIGH (ref 0.0–2.0)
Bicarbonate: 20.5 mmol/L (ref 20.0–28.0)
Bicarbonate: 18.7 mmol/L — ABNORMAL LOW (ref 20.0–28.0)
Bicarbonate: 21.2 mmol/L (ref 20.0–28.0)
Bicarbonate: 22 mmol/L (ref 20.0–28.0)
Bicarbonate: 22 mmol/L (ref 20.0–28.0)
Bicarbonate: 21.4 mmol/L (ref 20.0–28.0)
Bicarbonate: 21.1 mmol/L (ref 20.0–28.0)
Calcium, Ion: 1.18 mmol/L (ref 1.15–1.40)
Calcium, Ion: 1.03 mmol/L — ABNORMAL LOW (ref 1.15–1.40)
Calcium, Ion: 1.13 mmol/L — ABNORMAL LOW (ref 1.15–1.40)
Calcium, Ion: 1.14 mmol/L — ABNORMAL LOW (ref 1.15–1.40)
Calcium, Ion: 1.14 mmol/L — ABNORMAL LOW (ref 1.15–1.40)
Calcium, Ion: 1.11 mmol/L — ABNORMAL LOW (ref 1.15–1.40)
Calcium, Ion: 1.12 mmol/L — ABNORMAL LOW (ref 1.15–1.40)
HCT: 35 % — ABNORMAL LOW (ref 36.0–46.0)
HCT: 34 % — ABNORMAL LOW (ref 36.0–46.0)
HCT: 36 % (ref 36.0–46.0)
HCT: 32 % — ABNORMAL LOW (ref 36.0–46.0)
HCT: 32 % — ABNORMAL LOW (ref 36.0–46.0)
HCT: 27 % — ABNORMAL LOW (ref 36.0–46.0)
HCT: 24 % — ABNORMAL LOW (ref 36.0–46.0)
Hemoglobin: 11.9 g/dL — ABNORMAL LOW (ref 12.0–15.0)
Hemoglobin: 11.6 g/dL — ABNORMAL LOW (ref 12.0–15.0)
Hemoglobin: 12.2 g/dL (ref 12.0–15.0)
Hemoglobin: 10.9 g/dL — ABNORMAL LOW (ref 12.0–15.0)
Hemoglobin: 10.9 g/dL — ABNORMAL LOW (ref 12.0–15.0)
Hemoglobin: 9.2 g/dL — ABNORMAL LOW (ref 12.0–15.0)
Hemoglobin: 8.2 g/dL — ABNORMAL LOW (ref 12.0–15.0)
O2 Saturation: 97 %
O2 Saturation: 100 %
O2 Saturation: 100 %
O2 Saturation: 99 %
O2 Saturation: 99 %
O2 Saturation: 100 %
O2 Saturation: 100 %
Patient temperature: 36.3
Patient temperature: 37
Patient temperature: 36.8
Patient temperature: 36.8
Potassium: 3.1 mmol/L — ABNORMAL LOW (ref 3.5–5.1)
Potassium: 3.5 mmol/L (ref 3.5–5.1)
Potassium: 3.7 mmol/L (ref 3.5–5.1)
Potassium: 4.8 mmol/L (ref 3.5–5.1)
Potassium: 4.8 mmol/L (ref 3.5–5.1)
Potassium: 4.3 mmol/L (ref 3.5–5.1)
Potassium: 4.3 mmol/L (ref 3.5–5.1)
Sodium: 140 mmol/L (ref 135–145)
Sodium: 140 mmol/L (ref 135–145)
Sodium: 142 mmol/L (ref 135–145)
Sodium: 140 mmol/L (ref 135–145)
Sodium: 140 mmol/L (ref 135–145)
Sodium: 140 mmol/L (ref 135–145)
Sodium: 140 mmol/L (ref 135–145)
TCO2: 22 mmol/L (ref 22–32)
TCO2: 20 mmol/L — ABNORMAL LOW (ref 22–32)
TCO2: 23 mmol/L (ref 22–32)
TCO2: 23 mmol/L (ref 22–32)
TCO2: 23 mmol/L (ref 22–32)
TCO2: 23 mmol/L (ref 22–32)
TCO2: 22 mmol/L (ref 22–32)
pCO2 arterial: 52.2 mmHg — ABNORMAL HIGH (ref 32–48)
pCO2 arterial: 29.6 mmHg — ABNORMAL LOW (ref 32–48)
pCO2 arterial: 48.3 mmHg — ABNORMAL HIGH (ref 32–48)
pCO2 arterial: 46.5 mmHg (ref 32–48)
pCO2 arterial: 46.5 mmHg (ref 32–48)
pCO2 arterial: 40.1 mmHg (ref 32–48)
pCO2 arterial: 38.5 mmHg (ref 32–48)
pH, Arterial: 7.203 — ABNORMAL LOW (ref 7.35–7.45)
pH, Arterial: 7.405 (ref 7.35–7.45)
pH, Arterial: 7.249 — ABNORMAL LOW (ref 7.35–7.45)
pH, Arterial: 7.283 — ABNORMAL LOW (ref 7.35–7.45)
pH, Arterial: 7.283 — ABNORMAL LOW (ref 7.35–7.45)
pH, Arterial: 7.336 — ABNORMAL LOW (ref 7.35–7.45)
pH, Arterial: 7.346 — ABNORMAL LOW (ref 7.35–7.45)
pO2, Arterial: 108 mmHg (ref 83–108)
pO2, Arterial: 204 mmHg — ABNORMAL HIGH (ref 83–108)
pO2, Arterial: 246 mmHg — ABNORMAL HIGH (ref 83–108)
pO2, Arterial: 175 mmHg — ABNORMAL HIGH (ref 83–108)
pO2, Arterial: 175 mmHg — ABNORMAL HIGH (ref 83–108)
pO2, Arterial: 190 mmHg — ABNORMAL HIGH (ref 83–108)
pO2, Arterial: 177 mmHg — ABNORMAL HIGH (ref 83–108)

## 2022-05-31 LAB — URINALYSIS, ROUTINE W REFLEX MICROSCOPIC
Bacteria, UA: NONE SEEN
Bilirubin Urine: NEGATIVE
Glucose, UA: NEGATIVE mg/dL
Ketones, ur: NEGATIVE mg/dL
Leukocytes,Ua: NEGATIVE
Nitrite: NEGATIVE
Protein, ur: NEGATIVE mg/dL
Specific Gravity, Urine: >1.046 — ABNORMAL HIGH (ref 1.005–1.030)
pH: 5 (ref 5.0–8.0)

## 2022-05-31 LAB — GLUCOSE, CAPILLARY
Glucose-Capillary: 136 mg/dL — ABNORMAL HIGH (ref 70–99)
Glucose-Capillary: 136 mg/dL — ABNORMAL HIGH (ref 70–99)
Glucose-Capillary: 126 mg/dL — ABNORMAL HIGH (ref 70–99)

## 2022-05-31 LAB — COMPREHENSIVE METABOLIC PANEL
ALT: 41 U/L (ref 0–44)
AST: 69 U/L — ABNORMAL HIGH (ref 15–41)
Albumin: 3.3 g/dL — ABNORMAL LOW (ref 3.5–5.0)
Alkaline Phosphatase: 112 U/L (ref 38–126)
Anion gap: 12 (ref 5–15)
BUN: 15 mg/dL (ref 6–20)
CO2: 22 mmol/L (ref 22–32)
Calcium: 8.7 mg/dL — ABNORMAL LOW (ref 8.9–10.3)
Chloride: 106 mmol/L (ref 98–111)
Creatinine, Ser: 1.13 mg/dL — ABNORMAL HIGH (ref 0.44–1.00)
GFR, Estimated: >60 mL/min (ref >60)
Glucose, Bld: 149 mg/dL — ABNORMAL HIGH (ref 70–99)
Potassium: 3.7 mmol/L (ref 3.5–5.1)
Sodium: 140 mmol/L (ref 135–145)
Total Bilirubin: 0.4 mg/dL (ref 0.3–1.2)
Total Protein: 6.1 g/dL — ABNORMAL LOW (ref 6.5–8.1)

## 2022-05-31 LAB — I-STAT CHEM 8, ED
BUN: 17 mg/dL (ref 6–20)
Calcium, Ion: 1.15 mmol/L (ref 1.15–1.40)
Chloride: 103 mmol/L (ref 98–111)
Creatinine, Ser: 1.1 mg/dL — ABNORMAL HIGH (ref 0.44–1.00)
Glucose, Bld: 150 mg/dL — ABNORMAL HIGH (ref 70–99)
HCT: 38 % (ref 36.0–46.0)
Hemoglobin: 12.9 g/dL (ref 12.0–15.0)
Potassium: 3.4 mmol/L — ABNORMAL LOW (ref 3.5–5.1)
Sodium: 139 mmol/L (ref 135–145)
TCO2: 24 mmol/L (ref 22–32)

## 2022-05-31 LAB — PHOSPHORUS: Phosphorus: 3.1 mg/dL (ref 2.5–4.6)

## 2022-05-31 LAB — ETHANOL: Alcohol, Ethyl (B): <10 mg/dL (ref <10)

## 2022-05-31 LAB — BASIC METABOLIC PANEL
Anion gap: 10 (ref 5–15)
BUN: 15 mg/dL (ref 6–20)
CO2: 20 mmol/L — ABNORMAL LOW (ref 22–32)
Calcium: 7.5 mg/dL — ABNORMAL LOW (ref 8.9–10.3)
Chloride: 110 mmol/L (ref 98–111)
Creatinine, Ser: 0.78 mg/dL (ref 0.44–1.00)
GFR, Estimated: >60 mL/min (ref >60)
Glucose, Bld: 139 mg/dL — ABNORMAL HIGH (ref 70–99)
Potassium: 3.5 mmol/L (ref 3.5–5.1)
Sodium: 140 mmol/L (ref 135–145)

## 2022-05-31 LAB — I-STAT BETA HCG BLOOD, ED (MC, WL, AP ONLY): I-stat hCG, quantitative: <5 m[IU]/mL (ref <5)

## 2022-05-31 LAB — BLOOD PRODUCT ORDER (VERBAL) VERIFICATION

## 2022-05-31 LAB — PROTIME-INR
INR: 1.2 (ref 0.8–1.2)
Prothrombin Time: 14.8 seconds (ref 11.4–15.2)

## 2022-05-31 LAB — ABO/RH: ABO/RH(D): O POS

## 2022-05-31 LAB — HIV ANTIBODY (ROUTINE TESTING W REFLEX): HIV Screen 4th Generation wRfx: NONREACTIVE

## 2022-05-31 LAB — MAGNESIUM: Magnesium: 1.5 mg/dL — ABNORMAL LOW (ref 1.7–2.4)

## 2022-05-31 LAB — PREPARE RBC (CROSSMATCH)

## 2022-05-31 LAB — LACTIC ACID, PLASMA: Lactic Acid, Venous: 3.9 mmol/L (ref 0.5–1.9)

## 2022-05-31 LAB — MRSA NEXT GEN BY PCR, NASAL: MRSA by PCR Next Gen: NOT DETECTED

## 2022-05-31 SURGERY — LAPAROTOMY, EXPLORATORY
Anesthesia: General | Site: Abdomen

## 2022-05-31 SURGERY — INSERTION, INTRAMEDULLARY ROD, FEMUR
Anesthesia: General | Site: Pelvis | Laterality: Right

## 2022-05-31 MED ORDER — SODIUM CHLORIDE 0.9 % IV SOLN: 10.0000 mL/h | Freq: Once | INTRAVENOUS | Status: AC

## 2022-05-31 MED ORDER — SODIUM CHLORIDE 0.9 % IV SOLN: 250.0000 mL | INTRAVENOUS | Status: DC

## 2022-05-31 MED ORDER — 0.9 % SODIUM CHLORIDE (POUR BTL) OPTIME
TOPICAL | Status: DC | PRN
  Administered 2022-05-31: 1000 mL

## 2022-05-31 MED ORDER — FENTANYL BOLUS VIA INFUSION
50.0000 ug | INTRAVENOUS | Status: DC | PRN
  Administered 2022-05-31: 50 ug via INTRAVENOUS
  Administered 2022-06-06: 100 ug via INTRAVENOUS
  Administered 2022-06-07 – 2022-06-08 (×3): 50 ug via INTRAVENOUS

## 2022-05-31 MED ORDER — LACTATED RINGERS IV BOLUS
1000.0000 mL | Freq: Once | INTRAVENOUS | Status: AC
  Administered 2022-05-31: 1000 mL via INTRAVENOUS

## 2022-05-31 MED ORDER — POLYETHYLENE GLYCOL 3350 17 G PO PACK
17.0000 g | PACK | Freq: Every day | ORAL | Status: DC
  Administered 2022-06-01 – 2022-06-14 (×9): 17 g
  Filled 2022-05-31 (×12): qty 1

## 2022-05-31 MED ORDER — FENTANYL CITRATE (PF) 250 MCG/5ML IJ SOLN
INTRAMUSCULAR | Status: AC
  Filled 2022-05-31: qty 5

## 2022-05-31 MED ORDER — METHOCARBAMOL 500 MG PO TABS
1000.0000 mg | ORAL_TABLET | Freq: Three times a day (TID) | ORAL | Status: DC
  Administered 2022-05-31 – 2022-06-14 (×39): 1000 mg
  Filled 2022-05-31 (×39): qty 2

## 2022-05-31 MED ORDER — SODIUM CHLORIDE 0.9 % IV SOLN: INTRAVENOUS | Status: DC | PRN

## 2022-05-31 MED ORDER — MIDAZOLAM HCL 2 MG/2ML IJ SOLN
INTRAMUSCULAR | Status: AC
  Filled 2022-05-31: qty 2

## 2022-05-31 MED ORDER — TETANUS-DIPHTH-ACELL PERTUSSIS 5-2.5-18.5 LF-MCG/0.5 IM SUSY
0.5000 mL | PREFILLED_SYRINGE | Freq: Once | INTRAMUSCULAR | Status: AC
  Administered 2022-05-31: 0.5 mL via INTRAMUSCULAR
  Filled 2022-05-31: qty 0.5

## 2022-05-31 MED ORDER — FENTANYL 2500MCG IN NS 250ML (10MCG/ML) PREMIX INFUSION
0.0000 ug/h | INTRAVENOUS | Status: DC
  Administered 2022-05-31: 50 ug/h via INTRAVENOUS
  Administered 2022-06-01 – 2022-06-02 (×3): 150 ug/h via INTRAVENOUS
  Administered 2022-06-03: 300 ug/h via INTRAVENOUS
  Administered 2022-06-03: 400 ug/h via INTRAVENOUS
  Administered 2022-06-03 – 2022-06-04 (×2): 300 ug/h via INTRAVENOUS
  Administered 2022-06-05: 250 ug/h via INTRAVENOUS
  Administered 2022-06-05: 300 ug/h via INTRAVENOUS
  Administered 2022-06-06: 175 ug/h via INTRAVENOUS
  Filled 2022-05-31 (×14): qty 250

## 2022-05-31 MED ORDER — PHENYLEPHRINE HCL-NACL 20-0.9 MG/250ML-% IV SOLN
INTRAVENOUS | Status: DC | PRN
  Administered 2022-05-31: 50 ug/min via INTRAVENOUS

## 2022-05-31 MED ORDER — LACTATED RINGERS IV SOLN: INTRAVENOUS | Status: DC

## 2022-05-31 MED ORDER — NOREPINEPHRINE 4 MG/250ML-% IV SOLN: 0.0000 ug/min | INTRAVENOUS | Status: DC

## 2022-05-31 MED ORDER — PROPOFOL 1000 MG/100ML IV EMUL
0.0000 ug/kg/min | INTRAVENOUS | Status: DC
  Administered 2022-05-31: 30 ug/kg/min via INTRAVENOUS
  Administered 2022-05-31: 50 ug/kg/min via INTRAVENOUS
  Administered 2022-05-31: 20 ug/kg/min via INTRAVENOUS
  Administered 2022-06-01: 40 ug/kg/min via INTRAVENOUS
  Administered 2022-06-01: 50 ug/kg/min via INTRAVENOUS
  Administered 2022-06-01 (×2): 30 ug/kg/min via INTRAVENOUS
  Administered 2022-06-02: 45 ug/kg/min via INTRAVENOUS
  Administered 2022-06-02: 5 ug/kg/min via INTRAVENOUS
  Filled 2022-05-31 (×12): qty 100

## 2022-05-31 MED ORDER — ETOMIDATE 2 MG/ML IV SOLN
INTRAVENOUS | Status: DC | PRN
  Administered 2022-05-31: 20 mg via INTRAVENOUS

## 2022-05-31 MED ORDER — FENTANYL CITRATE (PF) 250 MCG/5ML IJ SOLN
INTRAMUSCULAR | Status: DC | PRN
  Administered 2022-05-31: 100 ug via INTRAVENOUS

## 2022-05-31 MED ORDER — PHENYLEPHRINE HCL-NACL 20-0.9 MG/250ML-% IV SOLN: 0.0000 ug/min | INTRAVENOUS | Status: DC

## 2022-05-31 MED ORDER — ORAL CARE MOUTH RINSE
15.0000 mL | OROMUCOSAL | Status: DC | PRN
  Administered 2022-05-31: 15 mL via OROMUCOSAL

## 2022-05-31 MED ORDER — LIDOCAINE HCL (CARDIAC) PF 100 MG/5ML IV SOSY
PREFILLED_SYRINGE | INTRAVENOUS | Status: DC | PRN
  Administered 2022-05-31: 40 mg via INTRAVENOUS

## 2022-05-31 MED ORDER — ALBUMIN HUMAN 5 % IV SOLN: INTRAVENOUS | Status: DC | PRN

## 2022-05-31 MED ORDER — VECURONIUM BROMIDE 10 MG IV SOLR
INTRAVENOUS | Status: DC | PRN
  Administered 2022-05-31 (×2): 5 mg via INTRAVENOUS

## 2022-05-31 MED ORDER — NOREPINEPHRINE 4 MG/250ML-% IV SOLN
2.0000 ug/min | INTRAVENOUS | Status: DC
  Administered 2022-05-31: 2 ug/min via INTRAVENOUS
  Filled 2022-05-31: qty 250

## 2022-05-31 MED ORDER — 0.9 % SODIUM CHLORIDE (POUR BTL) OPTIME
TOPICAL | Status: DC | PRN
  Administered 2022-05-31: 9000 mL
  Administered 2022-05-31: 1000 mL

## 2022-05-31 MED ORDER — PROPOFOL 1000 MG/100ML IV EMUL
INTRAVENOUS | Status: AC
  Filled 2022-05-31: qty 100

## 2022-05-31 MED ORDER — SODIUM CHLORIDE 0.9 % IV SOLN
2.0000 g | INTRAVENOUS | Status: DC
  Administered 2022-05-31 – 2022-06-01 (×2): 2 g via INTRAVENOUS
  Filled 2022-05-31 (×2): qty 20

## 2022-05-31 MED ORDER — FENTANYL CITRATE (PF) 250 MCG/5ML IJ SOLN
INTRAMUSCULAR | Status: DC | PRN
  Administered 2022-05-31: 50 ug via INTRAVENOUS
  Administered 2022-05-31: 150 ug via INTRAVENOUS
  Administered 2022-05-31: 100 ug via INTRAVENOUS
  Administered 2022-05-31: 50 ug via INTRAVENOUS
  Administered 2022-05-31: 100 ug via INTRAVENOUS
  Administered 2022-05-31: 50 ug via INTRAVENOUS

## 2022-05-31 MED ORDER — PROPOFOL 500 MG/50ML IV EMUL
INTRAVENOUS | Status: DC | PRN
  Administered 2022-05-31: 50 ug/kg/min via INTRAVENOUS

## 2022-05-31 MED ORDER — PHENYLEPHRINE 80 MCG/ML (10ML) SYRINGE FOR IV PUSH (FOR BLOOD PRESSURE SUPPORT)
PREFILLED_SYRINGE | INTRAVENOUS | Status: DC | PRN
  Administered 2022-05-31 (×3): 80 ug via INTRAVENOUS

## 2022-05-31 MED ORDER — PIVOT 1.5 CAL PO LIQD
1000.0000 mL | ORAL | Status: DC
  Administered 2022-05-31 – 2022-06-02 (×2): 1000 mL
  Filled 2022-05-31 (×2): qty 1000

## 2022-05-31 MED ORDER — PROSOURCE TF20 ENFIT COMPATIBL EN LIQD
60.0000 mL | Freq: Four times a day (QID) | ENTERAL | Status: DC
  Administered 2022-05-31 – 2022-06-01 (×3): 60 mL
  Filled 2022-05-31 (×3): qty 60

## 2022-05-31 MED ORDER — DOCUSATE SODIUM 50 MG/5ML PO LIQD
100.0000 mg | Freq: Two times a day (BID) | ORAL | Status: DC
  Administered 2022-05-31 – 2022-06-14 (×19): 100 mg
  Filled 2022-05-31 (×22): qty 10

## 2022-05-31 MED ORDER — OXYCODONE HCL 5 MG PO TABS
5.0000 mg | ORAL_TABLET | ORAL | Status: DC | PRN
  Administered 2022-06-01 – 2022-06-07 (×12): 10 mg
  Administered 2022-06-07 (×2): 5 mg
  Administered 2022-06-08 – 2022-06-09 (×6): 10 mg
  Filled 2022-05-31 (×2): qty 2
  Filled 2022-05-31 (×2): qty 1
  Filled 2022-05-31 (×16): qty 2

## 2022-05-31 MED ORDER — ORAL CARE MOUTH RINSE
15.0000 mL | OROMUCOSAL | Status: DC
  Administered 2022-05-31 – 2022-06-08 (×96): 15 mL via OROMUCOSAL

## 2022-05-31 MED ORDER — FENTANYL CITRATE (PF) 100 MCG/2ML IJ SOLN: 50.0000 ug | Freq: Once | INTRAMUSCULAR | Status: DC

## 2022-05-31 MED ORDER — PHENYLEPHRINE HCL (PRESSORS) 10 MG/ML IV SOLN
INTRAVENOUS | Status: DC | PRN
  Administered 2022-05-31: 240 ug via INTRAVENOUS

## 2022-05-31 MED ORDER — MAGNESIUM SULFATE 4 GM/100ML IV SOLN
4.0000 g | Freq: Once | INTRAVENOUS | Status: AC
  Administered 2022-06-01: 4 g via INTRAVENOUS
  Filled 2022-05-31: qty 100

## 2022-05-31 MED ORDER — 0.9 % SODIUM CHLORIDE (POUR BTL) OPTIME
TOPICAL | Status: DC | PRN
  Administered 2022-05-31: 4000 mL

## 2022-05-31 MED ORDER — MIDAZOLAM HCL 5 MG/5ML IJ SOLN
INTRAMUSCULAR | Status: DC | PRN
  Administered 2022-05-31: 2 mg via INTRAVENOUS

## 2022-05-31 MED ORDER — ROCURONIUM 10MG/ML (10ML) SYRINGE FOR MEDFUSION PUMP - OPTIME
INTRAVENOUS | Status: DC | PRN
  Administered 2022-05-31 (×2): 50 mg via INTRAVENOUS

## 2022-05-31 MED ORDER — DEXAMETHASONE SODIUM PHOSPHATE 4 MG/ML IJ SOLN
INTRAMUSCULAR | Status: DC | PRN
  Administered 2022-05-31: 10 mg via INTRAVENOUS

## 2022-05-31 MED ORDER — ROCURONIUM BROMIDE 100 MG/10ML IV SOLN
INTRAVENOUS | Status: DC | PRN
  Administered 2022-05-31: 60 mg via INTRAVENOUS
  Administered 2022-05-31: 40 mg via INTRAVENOUS

## 2022-05-31 MED ORDER — SUCCINYLCHOLINE 20MG/ML (10ML) SYRINGE FOR MEDFUSION PUMP - OPTIME
INTRAMUSCULAR | Status: DC | PRN
  Administered 2022-05-31: 140 mg via INTRAVENOUS

## 2022-05-31 MED ORDER — IOHEXOL 350 MG/ML SOLN
160.0000 mL | Freq: Once | INTRAVENOUS | Status: AC | PRN
  Administered 2022-05-31: 160 mL via INTRAVENOUS

## 2022-05-31 MED ORDER — LACTATED RINGERS IV SOLN: INTRAVENOUS | Status: DC | PRN

## 2022-05-31 MED ORDER — SODIUM ZIRCONIUM CYCLOSILICATE 10 G PO PACK: 10.0000 g | PACK | Freq: Once | ORAL | Status: DC

## 2022-05-31 MED ORDER — ACETAMINOPHEN 500 MG PO TABS
1000.0000 mg | ORAL_TABLET | Freq: Four times a day (QID) | ORAL | Status: DC
  Administered 2022-05-31 – 2022-06-09 (×35): 1000 mg
  Filled 2022-05-31 (×39): qty 2

## 2022-05-31 MED ORDER — ACETAMINOPHEN 10 MG/ML IV SOLN
1000.0000 mg | Freq: Four times a day (QID) | INTRAVENOUS | Status: DC
  Filled 2022-05-31: qty 100

## 2022-05-31 MED ORDER — SODIUM CHLORIDE 0.9% IV SOLUTION: Freq: Once | INTRAVENOUS | Status: DC

## 2022-05-31 MED ORDER — SODIUM BICARBONATE 8.4 % IV SOLN
INTRAVENOUS | Status: DC | PRN
  Administered 2022-05-31: 50 meq via INTRAVENOUS

## 2022-05-31 MED ORDER — CHLORHEXIDINE GLUCONATE CLOTH 2 % EX PADS
6.0000 | MEDICATED_PAD | Freq: Every day | CUTANEOUS | Status: DC
  Administered 2022-05-31 – 2022-06-11 (×18): 6 via TOPICAL

## 2022-05-31 SURGICAL SUPPLY — 71 items
BAG COUNTER SPONGE SURGICOUNT (BAG) ×4 IMPLANT
BAG SPNG CNTER NS LX DISP (BAG) ×3
BIT DRILL 110X30X2XCALB (BIT) IMPLANT
BIT DRILL 4.8X300 (BIT) IMPLANT
BIT DRILL QC 2.0X100 (BIT) ×3
BIT DRL 110X30X2XCALB (BIT) ×3
BNDG COHESIVE 6X5 TAN STRL LF (GAUZE/BANDAGES/DRESSINGS) IMPLANT
BNDG ELASTIC 4X5.8 VLCR STR LF (GAUZE/BANDAGES/DRESSINGS) IMPLANT
BRUSH SCRUB EZ PLAIN DRY (MISCELLANEOUS) ×8 IMPLANT
COVER PERINEAL POST (MISCELLANEOUS) ×4 IMPLANT
COVER SURGICAL LIGHT HANDLE (MISCELLANEOUS) ×8 IMPLANT
DRAPE C-ARMOR (DRAPES) ×4 IMPLANT
DRAPE HALF SHEET 40X57 (DRAPES) IMPLANT
DRAPE ORTHO SPLIT 77X108 STRL (DRAPES) ×6
DRAPE SURG ORHT 6 SPLT 77X108 (DRAPES) ×8 IMPLANT
DRAPE U-SHAPE 47X51 STRL (DRAPES) ×4 IMPLANT
DRESSING MEPILEX FLEX 4X4 (GAUZE/BANDAGES/DRESSINGS) IMPLANT
DRSG MEPILEX BORDER 4X4 (GAUZE/BANDAGES/DRESSINGS) ×4 IMPLANT
DRSG MEPILEX BORDER 4X8 (GAUZE/BANDAGES/DRESSINGS) ×4 IMPLANT
DRSG MEPILEX FLEX 4X4 (GAUZE/BANDAGES/DRESSINGS) ×12
ELECT REM PT RETURN 9FT ADLT (ELECTROSURGICAL) ×3
ELECTRODE REM PT RTRN 9FT ADLT (ELECTROSURGICAL) ×4 IMPLANT
GAUZE PAD ABD 7.5X8 STRL (GAUZE/BANDAGES/DRESSINGS) IMPLANT
GAUZE SPONGE 4X4 12PLY STRL (GAUZE/BANDAGES/DRESSINGS) IMPLANT
GLOVE BIO SURGEON STRL SZ7.5 (GLOVE) ×4 IMPLANT
GLOVE BIO SURGEON STRL SZ8 (GLOVE) ×4 IMPLANT
GLOVE BIOGEL PI IND STRL 7.5 (GLOVE) ×4 IMPLANT
GLOVE BIOGEL PI IND STRL 8 (GLOVE) ×4 IMPLANT
GLOVE SURG ORTHO LTX SZ7.5 (GLOVE) ×8 IMPLANT
GOWN STRL REUS W/ TWL LRG LVL3 (GOWN DISPOSABLE) ×8 IMPLANT
GOWN STRL REUS W/ TWL XL LVL3 (GOWN DISPOSABLE) ×4 IMPLANT
GOWN STRL REUS W/TWL LRG LVL3 (GOWN DISPOSABLE) ×6
GOWN STRL REUS W/TWL XL LVL3 (GOWN DISPOSABLE) ×3
KIT BASIN OR (CUSTOM PROCEDURE TRAY) ×4 IMPLANT
KIT TURNOVER KIT B (KITS) ×4 IMPLANT
MANIFOLD NEPTUNE II (INSTRUMENTS) ×4 IMPLANT
NS IRRIG 1000ML POUR BTL (IV SOLUTION) ×4 IMPLANT
PACK GENERAL/GYN (CUSTOM PROCEDURE TRAY) ×4 IMPLANT
PAD ARMBOARD 7.5X6 YLW CONV (MISCELLANEOUS) ×8 IMPLANT
PADDING CAST ABS COTTON 4X4 ST (CAST SUPPLIES) IMPLANT
PIN GUIDE DRIL TIP 2.8X300 STE (PIN) IMPLANT
PLATE LOCK VA 6H SM STER 2.7 (Plate) IMPLANT
SCREW CANN 8.0X150X40 (Screw) IMPLANT
SCREW CANN 8.0X70 16 THRD (Screw) IMPLANT
SCREW CANNULATED 8.0*85 HIP (Screw) IMPLANT
SCREW CANNULATED 8.0X70MM (Screw) ×3 IMPLANT
SCREW CORTEX 2.7X14 (Screw) IMPLANT
SCREW LOCK VA ST 2.7X12 (Screw) IMPLANT
SCREW LOCK VA ST 2.7X18 (Screw) IMPLANT
SCREW LOCKING VA 2.7X42 (Screw) IMPLANT
SCREW METAPHYSCAL 34MM (Screw) IMPLANT
STAPLER VISISTAT 35W (STAPLE) ×4 IMPLANT
STOCKINETTE IMPERVIOUS 9X36 MD (GAUZE/BANDAGES/DRESSINGS) IMPLANT
STOCKINETTE IMPERVIOUS LG (DRAPES) IMPLANT
SUCTION FRAZIER HANDLE 10FR (MISCELLANEOUS) ×3
SUCTION TUBE FRAZIER 10FR DISP (MISCELLANEOUS) IMPLANT
SUT ETHILON 2 0 FS 18 (SUTURE) ×4 IMPLANT
SUT ETHILON 2 0 PSLX (SUTURE) IMPLANT
SUT PDS AB 1 CT  36 (SUTURE) ×6
SUT PDS AB 1 CT 36 (SUTURE) IMPLANT
SUT PDS AB 2-0 CT1 27 (SUTURE) IMPLANT
SUT VIC AB 0 CT1 27 (SUTURE) ×3
SUT VIC AB 0 CT1 27XBRD ANBCTR (SUTURE) ×4 IMPLANT
SUT VIC AB 1 CT1 27 (SUTURE) ×3
SUT VIC AB 1 CT1 27XBRD ANBCTR (SUTURE) ×4 IMPLANT
SUT VIC AB 2-0 CT1 27 (SUTURE) ×3
SUT VIC AB 2-0 CT1 TAPERPNT 27 (SUTURE) ×4 IMPLANT
TOWEL GREEN STERILE (TOWEL DISPOSABLE) ×8 IMPLANT
TOWEL GREEN STERILE FF (TOWEL DISPOSABLE) ×4 IMPLANT
WASHER 8.0 (Washer) IMPLANT
WATER STERILE IRR 1000ML POUR (IV SOLUTION) ×4 IMPLANT

## 2022-05-31 SURGICAL SUPPLY — 51 items
APL PRP STRL LF DISP 70% ISPRP (MISCELLANEOUS)
BAG COUNTER SPONGE SURGICOUNT (BAG) ×2 IMPLANT
BAG SPNG CNTER NS LX DISP (BAG) ×1
BLADE CLIPPER SURG (BLADE) IMPLANT
BNDG CMPR MED 10X6 ELC LF (GAUZE/BANDAGES/DRESSINGS) ×1
BNDG ELASTIC 6X10 VLCR STRL LF (GAUZE/BANDAGES/DRESSINGS) IMPLANT
BNDG GAUZE DERMACEA FLUFF 4 (GAUZE/BANDAGES/DRESSINGS) IMPLANT
BNDG GZE DERMACEA 4 6PLY (GAUZE/BANDAGES/DRESSINGS) ×2
CANISTER SUCT 3000ML PPV (MISCELLANEOUS) ×2 IMPLANT
CHLORAPREP W/TINT 26 (MISCELLANEOUS) ×2 IMPLANT
COVER SURGICAL LIGHT HANDLE (MISCELLANEOUS) ×2 IMPLANT
DRAPE LAPAROSCOPIC ABDOMINAL (DRAPES) ×2 IMPLANT
DRAPE WARM FLUID 44X44 (DRAPES) ×2 IMPLANT
DRSG OPSITE POSTOP 4X10 (GAUZE/BANDAGES/DRESSINGS) IMPLANT
ELECT BLADE 6.5 EXT (BLADE) IMPLANT
ELECT REM PT RETURN 9FT ADLT (ELECTROSURGICAL) ×1
ELECTRODE REM PT RTRN 9FT ADLT (ELECTROSURGICAL) ×2 IMPLANT
GAUZE PAD ABD 8X10 STRL (GAUZE/BANDAGES/DRESSINGS) IMPLANT
GAUZE SPONGE 4X4 12PLY STRL (GAUZE/BANDAGES/DRESSINGS) IMPLANT
GAUZE XEROFORM 5X9 LF (GAUZE/BANDAGES/DRESSINGS) IMPLANT
GLOVE BIO SURGEON STRL SZ7.5 (GLOVE) ×2 IMPLANT
GLOVE INDICATOR 8.0 STRL GRN (GLOVE) ×2 IMPLANT
GOWN STRL REUS W/ TWL LRG LVL3 (GOWN DISPOSABLE) ×2 IMPLANT
GOWN STRL REUS W/ TWL XL LVL3 (GOWN DISPOSABLE) ×2 IMPLANT
GOWN STRL REUS W/TWL LRG LVL3 (GOWN DISPOSABLE) ×1
GOWN STRL REUS W/TWL XL LVL3 (GOWN DISPOSABLE) ×1
HANDLE SUCTION POOLE (INSTRUMENTS) ×2 IMPLANT
KIT BASIN OR (CUSTOM PROCEDURE TRAY) ×2 IMPLANT
KIT TURNOVER KIT B (KITS) ×2 IMPLANT
LIGASURE IMPACT 36 18CM CVD LR (INSTRUMENTS) IMPLANT
NS IRRIG 1000ML POUR BTL (IV SOLUTION) ×4 IMPLANT
PACK GENERAL/GYN (CUSTOM PROCEDURE TRAY) ×2 IMPLANT
PAD ARMBOARD 7.5X6 YLW CONV (MISCELLANEOUS) ×2 IMPLANT
PENCIL SMOKE EVACUATOR (MISCELLANEOUS) ×2 IMPLANT
SPECIMEN JAR LARGE (MISCELLANEOUS) IMPLANT
SPONGE T-LAP 18X18 ~~LOC~~+RFID (SPONGE) IMPLANT
STAPLER VISISTAT 35W (STAPLE) ×2 IMPLANT
SUCTION POOLE HANDLE (INSTRUMENTS) ×1
SUT ETHIBOND NAB CT1 #1 30IN (SUTURE) IMPLANT
SUT PDS AB 1 CTX 36 (SUTURE) IMPLANT
SUT PDS AB 1 TP1 96 (SUTURE) ×4 IMPLANT
SUT SILK 2 0 PERMA HAND 18 BK (SUTURE) IMPLANT
SUT SILK 2 0 SH CR/8 (SUTURE) ×2 IMPLANT
SUT SILK 2 0 TIES 10X30 (SUTURE) ×2 IMPLANT
SUT SILK 3 0 SH CR/8 (SUTURE) ×2 IMPLANT
SUT SILK 3 0 TIES 10X30 (SUTURE) ×2 IMPLANT
SYSTEM SAHARA CHEST DRAIN ATS (WOUND CARE) IMPLANT
TOWEL GREEN STERILE (TOWEL DISPOSABLE) ×2 IMPLANT
TRAY FOLEY MTR SLVR 16FR STAT (SET/KITS/TRAYS/PACK) ×2 IMPLANT
TRAY WAYNE PNEUMOTHORAX 14X18 (TRAY / TRAY PROCEDURE) IMPLANT
YANKAUER SUCT BULB TIP NO VENT (SUCTIONS) IMPLANT

## 2022-05-31 NOTE — ED Notes (Signed)
Critical findings in CT, Pt transported by [employer] and this RN to OR 1.

## 2022-05-31 NOTE — Progress Notes (Signed)
Patient discussed with EDP.  I was informed that there is concern for a L femur fx and a R knee laceration, but unfortunately the only imaging available is a chest XR and a single XR view of the pelvis.  No imaging of L femur or R knee is available, and I was told that such imaging could not be obtained at this time as patient headed emergently to OR with general surgery trauma team and any further imaging would not be obtained until after OR.  Unfortunately I do not feel comfortable making operative decisions in the absence of any imaging, and therefore stressed the importance of obtaining dedicated imaging of affected anatomy as soon as possible.  I personally placed orders for relevant imaging myself.  In the absence of any guiding imaging to support further orthopaedic recommendations, the following recommendations are made:  NWB BLE If femur fx is subsequently confirmed on XR, apply Bucks traction 20 lbs to affected extremity Keep NPO pending possible return to OR  Full consult to follow, with additional recommendations pending additional imaging.  Ernestina Columbia M.D. Orthopaedic Surgery [facility]

## 2022-05-31 NOTE — Transfer of Care (Signed)
Immediate Anesthesia Transfer of Care Note  Patient: Alyssa Lopez  Procedure(s) Performed: EXPLORATORY LAPAROTOMY repair of left diaphragmtic hernia, insertion of left chest tube (Abdomen)  Patient Location: SICU  Anesthesia Type:General  Level of Consciousness: Patient remains intubated per anesthesia plan  Airway & Oxygen Therapy: Patient placed on Ventilator (see vital sign flow sheet for setting)  Post-op Assessment: Report given to RN and Post -op Vital signs reviewed and stable  Post vital signs: Reviewed and stable  Last Vitals:  Vitals Value Taken Time  BP 127/56 (a-line) 05/31/22 0509  Temp    Pulse 101 05/31/22 0517  Resp 17 05/31/22 0517  SpO2 100 % 05/31/22 0517  Vitals shown include unvalidated device data.  Last Pain:  Vitals:   05/31/22 0138  PainSc: 7          Complications: No notable events documented.

## 2022-05-31 NOTE — Progress Notes (Signed)
Orthopedic Tech Progress Note Patient Details:  Alyssa Lopez 2000/07/09 [id number]  Musculoskeletal Traction Type of Traction: Bucks Skin Traction Traction Location: LLE Traction Weight: 20 lbs   Post Interventions Patient Tolerated: Well  Darleen Crocker 05/31/2022, 7:10 AM

## 2022-05-31 NOTE — Progress Notes (Signed)
Assessed both upper arms for PICC insertion.Unable to find any suitable veins.Unable to locate L cephalic and basilic vein; brachial vein PICC will occupy 65% of the vein.R upper is so swollen and unable to locate cephalic,basilic and brachial vein.May consider other type of cental line and interventional radiology.
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
## 2022-05-31 NOTE — Anesthesia Preprocedure Evaluation (Addendum)
Anesthesia Evaluation  Patient identified by MRN, date of birth, ID band Patient unresponsive    Reviewed: Allergy & Precautions, Patient's Chart, lab work & pertinent test results, Unable to perform ROS - Chart review onlyPreop documentation limited or incomplete due to emergent nature of procedure.  Airway Mallampati: Intubated       Dental  (+) Teeth Intact   Pulmonary neg pulmonary ROS    + decreased breath sounds      Cardiovascular negative cardio ROS  Rhythm:Regular Rate:Tachycardia     Neuro/Psych  negative psych ROS   GI/Hepatic negative GI ROS, Neg liver ROS,,,  Endo/Other  negative endocrine ROS    Renal/GU negative Renal ROS     Musculoskeletal negative musculoskeletal ROS (+)    Abdominal   Peds  Hematology negative hematology ROS (+)   Anesthesia Other Findings   Reproductive/Obstetrics                              Anesthesia Physical Anesthesia Plan  ASA: 3  Anesthesia Plan: General   Post-op Pain Management:    Induction: Inhalational  PONV Risk Score and Plan: 4 or greater and Ondansetron, Dexamethasone and Midazolam  Airway Management Planned: Oral ETT  Additional Equipment:   Intra-op Plan:   Post-operative Plan: Post-operative intubation/ventilation  Informed Consent: I have reviewed the patients History and Physical, chart, labs and discussed the procedure including the risks, benefits and alternatives for the proposed anesthesia with the patient or authorized representative who has indicated his/her understanding and acceptance.     History available from chart only and Only emergency history available  Plan Discussed with: CRNA  Anesthesia Plan Comments: (ETT and arterial line in place. )        Anesthesia Quick Evaluation
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
## 2022-05-31 NOTE — Progress Notes (Signed)
Orthopedic Tech Progress Note Patient Details:  Alyssa Lopez [birth year] [id number]  Patient ID: [name], female   DOB: 05-14-2001, 21 y.o.   MRN: [id number] Patient is currently in the OR with MD White, traction will be applied once she arrives to her room. Darleen Crocker 05/31/2022, 3:55 AM

## 2022-05-31 NOTE — ED Notes (Addendum)
Patient transported to CT, further xray studies to be obtained after CT.
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
## 2022-05-31 NOTE — Anesthesia Postprocedure Evaluation (Signed)
Anesthesia Post Note  Patient: ARFA LAMARCA  Procedure(s) Performed: IRRIGATION AND DEBRIDEMENT LEFT THIGH WOUND (Left) SACRO-ILIAC PINNING (Left: Pelvis) OPEN REDUCTION INTERNAL FIXATION (ORIF) PATELLA (Right: Knee)     Patient location during evaluation: SICU Anesthesia Type: General Level of consciousness: sedated Pain management: pain level controlled Vital Signs Assessment: post-procedure vital signs reviewed and stable Respiratory status: patient remains intubated per anesthesia plan Cardiovascular status: stable Postop Assessment: no apparent nausea or vomiting Anesthetic complications: no  No notable events documented.  Last Vitals:  Vitals:   05/31/22 1941 05/31/22 2020  BP:    Pulse:  90  Resp:  12  Temp:  (!) 36.2 C  SpO2: 100% 100%    Last Pain:  Vitals:   05/31/22 1851  TempSrc: Esophageal  PainSc:                  , DANIEL

## 2022-05-31 NOTE — Progress Notes (Signed)
Patient evaluated while in OR for ex lap with general surgery team.  Additionally, CT images now visible, including scout film which demonstrates segmental L femur fracture including subtrochanteric component.  R knee evaluated beneath drapes to the extent possible without compromising ex lap field.  10+ cm laceration over anterior knee.  Discussed possibility of exposing legs for washing/closing lac and placing traction pin in L proximal tibia, but Dr. Cliffton Asters preferred not to do this at this time as this may compromise the current operative field and setup.  Additionally given degree of trauma to LLE visible on CT scout, would prefer to verify with imaging that proximal tibia intact prior to placing a traction pin there; therefore, will plan for Bucks traction to LLE at the conclusion of general surgery intervention.  Order for 20 lbs Bucks traction placed.  Given complexity of L femur fracture on preliminary imaging anticipate return to OR with ortho trauma.  Keep NPO.
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
## 2022-05-31 NOTE — Anesthesia Procedure Notes (Addendum)
Procedure Name: Intubation Date/Time: 05/31/2022 2:34 AM  Performed by: Molli Hazard, CRNAPre-anesthesia Checklist: Patient identified, Emergency Drugs available, Suction available and Patient being monitored Patient Re-evaluated:Patient Re-evaluated prior to induction Oxygen Delivery Method: Circle system utilized Preoxygenation: Pre-oxygenation with 100% oxygen Induction Type: IV induction, Rapid sequence and Cricoid Pressure applied Laryngoscope Size: Glidescope and 3 Grade View: Grade I Tube type: Oral Tube size: 7.5 mm Number of attempts: 1 Airway Equipment and Method: Stylet Placement Confirmation: ETT inserted through vocal cords under direct vision, positive ETCO2 and breath sounds checked- equal and bilateral Secured at: 23 cm Tube secured with: Tape Dental Injury: Teeth and Oropharynx as per pre-operative assessment  Comments: Cervical collar remained on throughout induction/intubation
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
## 2022-05-31 NOTE — Progress Notes (Signed)
Trauma Event Note    Rounds with Dr. Bedelia Person this am- orders placed, primary RN [place] also at bedside. No questions. Spoke with parents at bedside.     Last imported Vital Signs BP (!) 110/58   Pulse (!) 126   Temp 98.6 F (37 C)   Resp 12   Ht 5\' 2"  (1.575 m)   Wt 211 lb 3.2 oz (95.8 kg)   LMP  (LMP Unknown) Comment: trauma  SpO2 100%   BMI 38.63 kg/m   Trending CBC Recent Labs    05/31/22 0140 05/31/22 0142 05/31/22 0511 05/31/22 0806 05/31/22 1142 05/31/22 1157  WBC 32.7*  --  22.6*  --  15.3*  --   HGB 12.1   < > 12.1 12.2 10.6* 10.9*  10.9*  HCT 38.1   < > 35.7* 36.0 32.6* 32.0*  32.0*  PLT 488*  --  277  --  224  --    < > = values in this interval not displayed.    Trending Coag's Recent Labs    05/31/22 0140  INR 1.2    Trending BMET Recent Labs    05/31/22 0140 05/31/22 0142 05/31/22 0302 05/31/22 0511 05/31/22 0806 05/31/22 1157  NA 140 139   < > 140 142 140  140  K 3.7 3.4*   < > 3.5 3.7 4.8  4.8  CL 106 103  --  110  --   --   CO2 22  --   --  20*  --   --   BUN 15 17  --  15  --   --   CREATININE 1.13* 1.10*  --  0.78  --   --   GLUCOSE 149* 150*  --  139*  --   --    < > = values in this interval not displayed.      06/02/22   Trauma Response RN  Please call TRN at [phone] for further assistance.

## 2022-05-31 NOTE — Progress Notes (Addendum)
Trauma Event Note    Dr. Sheliah Hatch notified of critical MAG 1.5.  I have attempted to reach on call sports med x2 since 2130 and have not heard back. I just paged ortho trauma office in attempt to clarify traction orders. Orders for 20LBS however pt returned from OR in 30LBS traction.       O   Trauma Response RN  Please call TRN at [phone] for further assistance.
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
## 2022-05-31 NOTE — Op Note (Signed)
05/31/2022  4:51 AM  PATIENT:  Alyssa Lopez  21 y.o. female  No care team member to display  PRE-OPERATIVE DIAGNOSIS:  Traumatic diaphragmatic hernia, left  POST-OPERATIVE DIAGNOSIS:  Same  PROCEDURE:   Exploratory laparotomy with repair of 15 cm traumatic diaphragmatic hernia Placement of left chest tube  SURGEON:  Stephanie Coup. , MD  ASSISTANT: OR staff  ANESTHESIA:   general  COUNTS:  Sponge, needle and instrument counts were reported correct x2 at the conclusion of the operation.  EBL: 20 mL  DRAINS: Left chest tube placed at conclusion  SPECIMEN: None  COMPLICATIONS: none  FINDINGS: Large traumatic hernia of the left hemidiaphragm containing liver stomach and colon.  Tear in the diaphragm measured approximately 15 cm in length.  There is no hollow viscus or solid organ injury evident.  The diaphragm was repaired primarily.  A left-sided 28 French chest tube was placed at completion.  DISPOSITION: ICU in critical but stable condition  DESCRIPTION: The patient was brought emergently to the OR from the CT scanner.  She was placed on the operating room table. SCDs were placed. General endotracheal anesthesia was induced without difficulty.  A Foley catheter was placed by nursing.  She was then prepped and draped in the usual sterile fashion. A surgical timeout was performed indicating the correct patient, procedure, positioning and need for preoperative antibiotics.   A midline laparotomy was created.  The subcutaneous tissue divided electrocautery.  The fascia incised the midline.  The peritoneal cavity is cautiously entered bluntly.  The abdomen is surveyed.  There is no significant volume of hemoperitoneum.  There is an evident left hemidiaphragm traumatic injury.  A Bookwalter retractor was placed.  The small bowel was eviscerated and run from the ligament of Treitz to the ileocecal valve and there is no evident injury.  There is no associated mesenteric injury or  contusion.  The colon is inspected from the cecum to the ascending, transverse, descending colon.  These were all normal in appearance without any evident injury.  The sigmoid is also normal in appearance.  There is a partial tear of Glisson's capsule on the left Hemi liver but the liver is otherwise normal and the small tear is hemostatic.  The gallbladder is normal.  The stomach is normal.  The spleen is without injury.  There is no blood in the left upper quadrant.  Small bowel and stomach as well as colon and liver are reduced from her hernia.  The hernia measures approximately 15 cm in length.  A Bookwalter retractor was then placed.  We then used Babcock clamps to grasp both edges of the hemidiaphragm that had been torn.  The long visible through this tear is contracted but without any apparent injury or lacerations.  The edge of the diaphragm were then brought together using a Babcock clamps and the hernia defect completely repaired using #1 Ethibond sutures.  The repair was inspected noted to be intact without any significant gaps or tearing.  The abdomen is irrigated.  Hemostasis is again verified.  There is no retroperitoneal hematomas including in zones 1 2 or 3.  The midline fascia is then closed using 2 running #1 looped PDS sutures, tied centrally.  The closure was palpated and noted to be complete without any gaps.  All sponge, needle, and instrument counts were reported correct.  The skin is closed using staples.  Attention is then directed at placing a left-sided chest tube.  The left chest is prepped and draped.  Given her diaphragmatic injury and habitus, we opted to proceed with a 28 French chest tube placement.  The skin incision is made in the anterior axillary line approximately over the fifth rib space which is in line with the inframammary fold.  Subcutaneous tissue was bluntly spread.  The fifth interspace is palpated.  A blunt tipped Tresa Endo is used to enter the left chest.  No then  able to palpate her left lung and diaphragm through this hole.  The 28 French chest tube was then placed directing it posteriorly and apically.  This is then placed to a Pleur-evac.  This is secured using a 0 silk suture.  The chest tube was then taped to secure it in place.  Her lower extremity injuries were washed and dried.  4 x 4's and Kerlix were placed over these.  She was then transferred to a stretcher for transport to the intensive care unit in stable but critical condition.  Of note, Dr. Sherilyn Dacosta came into the room during the beginning of the operation and had indicated that he would plan to have her other orthopedic injuries addressed by the orthopedic trauma service later this morning. He was able to assess her open fracture of the knee and asked that we rinse it with saline and dress with gauze.

## 2022-05-31 NOTE — Anesthesia Procedure Notes (Signed)
Arterial Line Insertion Start/End12/14/2023 2:44 AM, 05/31/2022 2:50 AM Performed by: Jairo Ben, MD, anesthesiologist  Patient location: Pre-op. Preanesthetic checklist: patient identified, IV checked, monitors and equipment checked, pre-op evaluation, timeout performed and anesthesia consent Emergency situation Lidocaine 1% used for infiltration Left, radial was placed Catheter size: 20 G Hand hygiene performed  and maximum sterile barriers used   Attempts: 2 Procedure performed using ultrasound guided technique. Ultrasound Notes:anatomy identified, needle tip was noted to be adjacent to the nerve/plexus identified, no ultrasound evidence of intravascular and/or intraneural injection and image(s) printed for medical record Following insertion, dressing applied. Post procedure assessment: normal and unchanged  Patient tolerated the procedure well with no immediate complications.

## 2022-05-31 NOTE — Anesthesia Procedure Notes (Signed)
Date/Time: 05/31/2022 1:16 PM  Performed by: Marena Chancy, CRNAPre-anesthesia Checklist: Patient identified, Emergency Drugs available, Suction available, Patient being monitored and Timeout performed Patient Re-evaluated:Patient Re-evaluated prior to induction Oxygen Delivery Method: Circle system utilized Preoxygenation: Pre-oxygenation with 100% oxygen Induction Type: Inhalational induction with existing ETT
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
## 2022-05-31 NOTE — Progress Notes (Signed)
Orthopedic Tech Progress Note Patient Details:  Alyssa Lopez [birth year] [id number]  Patient ID: [name], female   DOB: Dec 25, 2000, 21 y.o.   MRN: [id number] Level I; assisted with leg stabilization during CT. No splinting currently needed as the patient is going to the OR.  [place] A  05/31/2022, 2:30 AM

## 2022-05-31 NOTE — TOC CAGE-AID Note (Signed)
Transition of Care [facility]) - CAGE-AID Screening   Patient Details  Name: Alyssa Lopez MRN: [id number] Date of Birth: 12/04/2000     Hewitt Shorts, RN Trauma Response Nurse Phone Number: [phone] 05/31/2022, 4:58 PM       CAGE-AID Screening: Substance Abuse Screening unable to be completed due to: : Patient unable to participate (Pt is intubated at this time)  Have You Ever Felt You Ought to Cut Down on Your Drinking or Drug Use?: No Have People Annoyed You By Critizing Your Drinking Or Drug Use?: No Have You Felt Bad Or Guilty About Your Drinking Or Drug Use?: No Have You Ever Had a Drink or Used Drugs First Thing In The Morning to Steady Your Nerves or to Get Rid of a Hangover?: No CAGE-AID Score: 0

## 2022-05-31 NOTE — ED Notes (Addendum)
Right AC IV infiltrated during CT. Warm compress and compression applied, pt re-scanned using Left AC IV.

## 2022-05-31 NOTE — Progress Notes (Signed)
Orthopedic Tech Progress Note Patient Details:  Alyssa Lopez 2000-07-15 [id number]  Patient ID: [name], female   DOB: November 25, 2000, 21 y.o.   MRN: [id number] X-ray arrived at the same time I did to place traction and requested that I wait until she is finished before I place the traction, she said it would be about 15-20 minutes. I notified the RN and requested she call the ortho tech phone once the patient is ready to be placed in traction.  Darleen Crocker 05/31/2022, 5:54 AM

## 2022-05-31 NOTE — Transfer of Care (Signed)
Immediate Anesthesia Transfer of Care Note  Patient: Alyssa Lopez  Procedure(s) Performed: IRRIGATION AND DEBRIDEMENT LEFT THIGH WOUND (Left) SACRO-ILIAC PINNING (Left: Pelvis) OPEN REDUCTION INTERNAL FIXATION (ORIF) PATELLA (Right: Knee)  Patient Location: ICU  Anesthesia Type:General  Level of Consciousness: Patient remains intubated per anesthesia plan  Airway & Oxygen Therapy: Patient remains intubated per anesthesia plan and Patient placed on Ventilator (see vital sign flow sheet for setting)  Post-op Assessment: Report given to RN and Post -op Vital signs reviewed and stable  Post vital signs: Reviewed and stable  Last Vitals:  Vitals Value Taken Time  BP    Temp    Pulse    Resp    SpO2      Last Pain:  Vitals:   05/31/22 0800  TempSrc: Esophageal  PainSc:          Complications: No notable events documented.
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
## 2022-06-01 ENCOUNTER — Encounter (HOSPITAL_COMMUNITY): Admission: EM | Disposition: A | Discharge status: Rehabilitation Facility | Payer: Self-pay | Source: Home / Self Care

## 2022-06-01 ENCOUNTER — Inpatient Hospital Stay (HOSPITAL_COMMUNITY): Payer: BC Managed Care – PPO | Admitting: Certified Registered Nurse Anesthetist

## 2022-06-01 ENCOUNTER — Inpatient Hospital Stay (HOSPITAL_COMMUNITY): Payer: BC Managed Care – PPO

## 2022-06-01 ENCOUNTER — Encounter (HOSPITAL_COMMUNITY): Payer: Self-pay

## 2022-06-01 HISTORY — PX: FEMUR IM NAIL: SHX1597

## 2022-06-01 HISTORY — PX: OPEN REDUCTION INTERNAL FIXATION (ORIF) DISTAL RADIAL FRACTURE: SHX5989

## 2022-06-01 LAB — CBC
HCT: 28.2 % — ABNORMAL LOW (ref 36.0–46.0)
Hemoglobin: 9.6 g/dL — ABNORMAL LOW (ref 12.0–15.0)
MCH: 29.7 pg (ref 26.0–34.0)
MCHC: 34 g/dL (ref 30.0–36.0)
MCV: 87.3 fL (ref 80.0–100.0)
Platelets: 145 10*3/uL — ABNORMAL LOW (ref 150–400)
RBC: 3.23 MIL/uL — ABNORMAL LOW (ref 3.87–5.11)
RDW: 14.8 % (ref 11.5–15.5)
WBC: 12.3 10*3/uL — ABNORMAL HIGH (ref 4.0–10.5)
nRBC: 0 % (ref 0.0–0.2)

## 2022-06-01 LAB — POCT I-STAT 7, (LYTES, BLD GAS, ICA,H+H)
Acid-Base Excess: 0 mmol/L (ref 0.0–2.0)
Acid-Base Excess: 2 mmol/L (ref 0.0–2.0)
Acid-base deficit: 2 mmol/L (ref 0.0–2.0)
Acid-base deficit: 1 mmol/L (ref 0.0–2.0)
Bicarbonate: 23.7 mmol/L (ref 20.0–28.0)
Bicarbonate: 24.7 mmol/L (ref 20.0–28.0)
Bicarbonate: 24.3 mmol/L (ref 20.0–28.0)
Bicarbonate: 26.6 mmol/L (ref 20.0–28.0)
Calcium, Ion: 1.22 mmol/L (ref 1.15–1.40)
Calcium, Ion: 1.22 mmol/L (ref 1.15–1.40)
Calcium, Ion: 1.11 mmol/L — ABNORMAL LOW (ref 1.15–1.40)
Calcium, Ion: 1.15 mmol/L (ref 1.15–1.40)
HCT: 21 % — ABNORMAL LOW (ref 36.0–46.0)
HCT: 21 % — ABNORMAL LOW (ref 36.0–46.0)
HCT: 25 % — ABNORMAL LOW (ref 36.0–46.0)
HCT: 26 % — ABNORMAL LOW (ref 36.0–46.0)
Hemoglobin: 7.1 g/dL — ABNORMAL LOW (ref 12.0–15.0)
Hemoglobin: 7.1 g/dL — ABNORMAL LOW (ref 12.0–15.0)
Hemoglobin: 8.5 g/dL — ABNORMAL LOW (ref 12.0–15.0)
Hemoglobin: 8.8 g/dL — ABNORMAL LOW (ref 12.0–15.0)
O2 Saturation: 100 %
O2 Saturation: 99 %
O2 Saturation: 100 %
O2 Saturation: 99 %
Potassium: 4 mmol/L (ref 3.5–5.1)
Potassium: 4.1 mmol/L (ref 3.5–5.1)
Potassium: 4.1 mmol/L (ref 3.5–5.1)
Potassium: 4 mmol/L (ref 3.5–5.1)
Sodium: 139 mmol/L (ref 135–145)
Sodium: 141 mmol/L (ref 135–145)
Sodium: 140 mmol/L (ref 135–145)
Sodium: 141 mmol/L (ref 135–145)
TCO2: 25 mmol/L (ref 22–32)
TCO2: 26 mmol/L (ref 22–32)
TCO2: 26 mmol/L (ref 22–32)
TCO2: 28 mmol/L (ref 22–32)
pCO2 arterial: 40.5 mmHg (ref 32–48)
pCO2 arterial: 41 mmHg (ref 32–48)
pCO2 arterial: 41.7 mmHg (ref 32–48)
pCO2 arterial: 39.3 mmHg (ref 32–48)
pH, Arterial: 7.369 (ref 7.35–7.45)
pH, Arterial: 7.393 (ref 7.35–7.45)
pH, Arterial: 7.373 (ref 7.35–7.45)
pH, Arterial: 7.438 (ref 7.35–7.45)
pO2, Arterial: 135 mmHg — ABNORMAL HIGH (ref 83–108)
pO2, Arterial: 191 mmHg — ABNORMAL HIGH (ref 83–108)
pO2, Arterial: 172 mmHg — ABNORMAL HIGH (ref 83–108)
pO2, Arterial: 119 mmHg — ABNORMAL HIGH (ref 83–108)

## 2022-06-01 LAB — GLUCOSE, CAPILLARY
Glucose-Capillary: 102 mg/dL — ABNORMAL HIGH (ref 70–99)
Glucose-Capillary: 107 mg/dL — ABNORMAL HIGH (ref 70–99)
Glucose-Capillary: 108 mg/dL — ABNORMAL HIGH (ref 70–99)
Glucose-Capillary: 124 mg/dL — ABNORMAL HIGH (ref 70–99)
Glucose-Capillary: 136 mg/dL — ABNORMAL HIGH (ref 70–99)
Glucose-Capillary: 146 mg/dL — ABNORMAL HIGH (ref 70–99)

## 2022-06-01 LAB — PREPARE RBC (CROSSMATCH)

## 2022-06-01 LAB — TRIGLYCERIDES: Triglycerides: 124 mg/dL (ref <150)

## 2022-06-01 LAB — MAGNESIUM: Magnesium: 2.6 mg/dL — ABNORMAL HIGH (ref 1.7–2.4)

## 2022-06-01 LAB — PHOSPHORUS: Phosphorus: 2.9 mg/dL (ref 2.5–4.6)

## 2022-06-01 SURGERY — OPEN REDUCTION INTERNAL FIXATION (ORIF) DISTAL RADIUS FRACTURE
Anesthesia: General | Laterality: Right

## 2022-06-01 MED ORDER — DEXMEDETOMIDINE HCL IN NACL 400 MCG/100ML IV SOLN
INTRAVENOUS | Status: AC
  Administered 2022-06-01: 0.4 ug/kg/h via INTRAVENOUS
  Filled 2022-06-01: qty 100

## 2022-06-01 MED ORDER — ONDANSETRON HCL 4 MG/2ML IJ SOLN
INTRAMUSCULAR | Status: DC | PRN
  Administered 2022-06-01: 4 mg via INTRAVENOUS

## 2022-06-01 MED ORDER — FENTANYL CITRATE (PF) 250 MCG/5ML IJ SOLN
INTRAMUSCULAR | Status: AC
  Filled 2022-06-01: qty 5

## 2022-06-01 MED ORDER — ROCURONIUM BROMIDE 10 MG/ML (PF) SYRINGE
PREFILLED_SYRINGE | INTRAVENOUS | Status: DC | PRN
  Administered 2022-06-01 (×4): 50 mg via INTRAVENOUS

## 2022-06-01 MED ORDER — FENTANYL CITRATE (PF) 250 MCG/5ML IJ SOLN
INTRAMUSCULAR | Status: DC | PRN
  Administered 2022-06-01: 50 ug via INTRAVENOUS
  Administered 2022-06-01: 100 ug via INTRAVENOUS
  Administered 2022-06-01: 50 ug via INTRAVENOUS
  Administered 2022-06-01: 100 ug via INTRAVENOUS
  Administered 2022-06-01 (×6): 50 ug via INTRAVENOUS

## 2022-06-01 MED ORDER — VANCOMYCIN HCL 1000 MG IV SOLR
INTRAVENOUS | Status: AC
  Filled 2022-06-01: qty 20

## 2022-06-01 MED ORDER — SODIUM CHLORIDE 0.9 % IV SOLN
2.0000 g | INTRAVENOUS | Status: DC
  Administered 2022-06-02: 2 g via INTRAVENOUS
  Filled 2022-06-01: qty 20

## 2022-06-01 MED ORDER — PROSOURCE TF20 ENFIT COMPATIBL EN LIQD
60.0000 mL | ENTERAL | Status: DC
  Administered 2022-06-01 – 2022-06-03 (×10): 60 mL
  Filled 2022-06-01 (×11): qty 60

## 2022-06-01 MED ORDER — PROPOFOL 10 MG/ML IV BOLUS
INTRAVENOUS | Status: DC | PRN
  Administered 2022-06-01 (×2): 50 mg via INTRAVENOUS

## 2022-06-01 MED ORDER — LACTATED RINGERS IV SOLN: INTRAVENOUS | Status: DC | PRN

## 2022-06-01 MED ORDER — DEXMEDETOMIDINE HCL IN NACL 80 MCG/20ML IV SOLN
INTRAVENOUS | Status: DC | PRN
  Administered 2022-06-01: 12 ug via BUCCAL

## 2022-06-01 MED ORDER — MIDAZOLAM HCL 5 MG/5ML IJ SOLN
INTRAMUSCULAR | Status: DC | PRN
  Administered 2022-06-01: 2 mg via INTRAVENOUS

## 2022-06-01 MED ORDER — VANCOMYCIN HCL 1000 MG IV SOLR
INTRAVENOUS | Status: DC | PRN
  Administered 2022-06-01: 1000 mg

## 2022-06-01 MED ORDER — 0.9 % SODIUM CHLORIDE (POUR BTL) OPTIME
TOPICAL | Status: DC | PRN
  Administered 2022-06-01: 1000 mL

## 2022-06-01 MED ORDER — SODIUM CHLORIDE 0.9% IV SOLUTION: Freq: Once | INTRAVENOUS | Status: DC

## 2022-06-01 MED ORDER — MIDAZOLAM HCL 2 MG/2ML IJ SOLN
INTRAMUSCULAR | Status: AC
  Filled 2022-06-01: qty 2

## 2022-06-01 MED ORDER — ALBUMIN HUMAN 5 % IV SOLN: INTRAVENOUS | Status: DC | PRN

## 2022-06-01 MED ORDER — ROCURONIUM BROMIDE 10 MG/ML (PF) SYRINGE
PREFILLED_SYRINGE | INTRAVENOUS | Status: AC
  Filled 2022-06-01: qty 20

## 2022-06-01 MED ORDER — DEXMEDETOMIDINE HCL IN NACL 400 MCG/100ML IV SOLN
0.4000 ug/kg/h | INTRAVENOUS | Status: DC
  Administered 2022-06-02: 0.4 ug/kg/h via INTRAVENOUS
  Administered 2022-06-02: 1 ug/kg/h via INTRAVENOUS
  Administered 2022-06-02: 1.2 ug/kg/h via INTRAVENOUS
  Administered 2022-06-02 (×2): 1 ug/kg/h via INTRAVENOUS
  Administered 2022-06-03 (×2): 1.2 ug/kg/h via INTRAVENOUS
  Filled 2022-06-01 (×9): qty 100

## 2022-06-01 MED ORDER — SODIUM CHLORIDE 0.9 % IV SOLN: INTRAVENOUS | Status: DC | PRN

## 2022-06-01 MED ORDER — ADULT MULTIVITAMIN W/MINERALS CH
1.0000 | ORAL_TABLET | Freq: Every day | ORAL | Status: DC
  Administered 2022-06-01 – 2022-06-14 (×14): 1
  Filled 2022-06-01 (×14): qty 1

## 2022-06-01 SURGICAL SUPPLY — 98 items
BAG COUNTER SPONGE SURGICOUNT (BAG) ×3 IMPLANT
BAG SPNG CNTER NS LX DISP (BAG) ×2
BIT DRILL 2.2 SS TIBIAL (BIT) IMPLANT
BIT DRILL CANN FLEX 14 (BIT) IMPLANT
BIT DRILL SHORT 4.2 (BIT) IMPLANT
BIT DRILL STEP 4.5X6.5 (BIT) IMPLANT
BNDG CMPR 9X4 STRL LF SNTH (GAUZE/BANDAGES/DRESSINGS)
BNDG COHESIVE 6X5 TAN STRL LF (GAUZE/BANDAGES/DRESSINGS) IMPLANT
BNDG ELASTIC 3X5.8 VLCR STR LF (GAUZE/BANDAGES/DRESSINGS) IMPLANT
BNDG ELASTIC 4X5.8 VLCR STR LF (GAUZE/BANDAGES/DRESSINGS) ×3 IMPLANT
BNDG ESMARK 4X9 LF (GAUZE/BANDAGES/DRESSINGS) IMPLANT
BRUSH SCRUB EZ PLAIN DRY (MISCELLANEOUS) ×6 IMPLANT
COVER PERINEAL POST (MISCELLANEOUS) ×3 IMPLANT
COVER SURGICAL LIGHT HANDLE (MISCELLANEOUS) ×6 IMPLANT
DRAPE C-ARM 42X72 X-RAY (DRAPES) ×3 IMPLANT
DRAPE C-ARMOR (DRAPES) ×3 IMPLANT
DRAPE HALF SHEET 40X57 (DRAPES) IMPLANT
DRAPE ORTHO SPLIT 77X108 STRL (DRAPES) ×10
DRAPE SURG ORHT 6 SPLT 77X108 (DRAPES) ×6 IMPLANT
DRAPE U-SHAPE 47X51 STRL (DRAPES) ×3 IMPLANT
DRESSING MEPILEX FLEX 4X4 (GAUZE/BANDAGES/DRESSINGS) IMPLANT
DRILL BIT SHORT 4.2 (BIT) ×2
DRILL BIT STEP 4.5X6.5 (BIT) ×2
DRSG ADAPTIC 3X8 NADH LF (GAUZE/BANDAGES/DRESSINGS) IMPLANT
DRSG EMULSION OIL 3X3 NADH (GAUZE/BANDAGES/DRESSINGS) ×3 IMPLANT
DRSG MEPILEX BORDER 4X4 (GAUZE/BANDAGES/DRESSINGS) ×3 IMPLANT
DRSG MEPILEX BORDER 4X8 (GAUZE/BANDAGES/DRESSINGS) ×3 IMPLANT
DRSG MEPILEX FLEX 4X4 (GAUZE/BANDAGES/DRESSINGS) ×2
DRSG MEPILEX POST OP 4X8 (GAUZE/BANDAGES/DRESSINGS) IMPLANT
ELECT REM PT RETURN 9FT ADLT (ELECTROSURGICAL) ×2
ELECTRODE REM PT RTRN 9FT ADLT (ELECTROSURGICAL) ×3 IMPLANT
GAUZE SPONGE 4X4 12PLY STRL (GAUZE/BANDAGES/DRESSINGS) ×3 IMPLANT
GLOVE BIO SURGEON STRL SZ7.5 (GLOVE) ×3 IMPLANT
GLOVE BIO SURGEON STRL SZ8 (GLOVE) ×3 IMPLANT
GLOVE BIOGEL PI IND STRL 7.5 (GLOVE) ×3 IMPLANT
GLOVE BIOGEL PI IND STRL 8 (GLOVE) ×3 IMPLANT
GLOVE SURG ORTHO LTX SZ7.5 (GLOVE) ×6 IMPLANT
GOWN STRL REUS W/ TWL LRG LVL3 (GOWN DISPOSABLE) ×6 IMPLANT
GOWN STRL REUS W/ TWL XL LVL3 (GOWN DISPOSABLE) ×3 IMPLANT
GOWN STRL REUS W/TWL LRG LVL3 (GOWN DISPOSABLE) ×4
GOWN STRL REUS W/TWL XL LVL3 (GOWN DISPOSABLE) ×2
GUIDEWIRE 3.2X400 (WIRE) IMPLANT
K-WIRE 1.6 (WIRE) ×4
K-WIRE FX5X1.6XNS BN SS (WIRE) ×4
KIT BASIN OR (CUSTOM PROCEDURE TRAY) ×3 IMPLANT
KIT TURNOVER KIT B (KITS) ×3 IMPLANT
KWIRE FX5X1.6XNS BN SS (WIRE) IMPLANT
MANIFOLD NEPTUNE II (INSTRUMENTS) ×3 IMPLANT
NAIL CANN FRN TI 9X360 LT (Nail) IMPLANT
NDL HYPO 25GX1X1/2 BEV (NEEDLE) IMPLANT
NEEDLE HYPO 25GX1X1/2 BEV (NEEDLE) IMPLANT
NS IRRIG 1000ML POUR BTL (IV SOLUTION) ×3 IMPLANT
PACK GENERAL/GYN (CUSTOM PROCEDURE TRAY) ×3 IMPLANT
PACK ORTHO EXTREMITY (CUSTOM PROCEDURE TRAY) ×3 IMPLANT
PAD ARMBOARD 7.5X6 YLW CONV (MISCELLANEOUS) ×6 IMPLANT
PAD CAST 3X4 CTTN HI CHSV (CAST SUPPLIES) ×3 IMPLANT
PADDING CAST ABS COTTON 3X4 (CAST SUPPLIES) IMPLANT
PADDING CAST COTTON 3X4 STRL (CAST SUPPLIES) ×2
PEG LOCKING SMOOTH 2.2X15 (Peg) IMPLANT
PEG LOCKING SMOOTH 2.2X18 (Peg) IMPLANT
PEG LOCKING SMOOTH 2.2X20 (Screw) IMPLANT
PLATE NARROW DVR RIGHT (Plate) IMPLANT
REAMER ROD DEEP FLUTE 2.5X950 (INSTRUMENTS) IMPLANT
ROPE TRACTION 120 (SOFTGOODS) IMPLANT
SCREW HIP F/IM NL 6.5/L85/XL40 (Screw) IMPLANT
SCREW HIP IM  NAIL 6.5X90 (Screw) ×2 IMPLANT
SCREW HIP IM NAIL 6.5X80 (Screw) IMPLANT
SCREW HIP IM NAIL 6.5X90 (Screw) IMPLANT
SCREW LOCK 12X2.7X 3 LD (Screw) IMPLANT
SCREW LOCK 14X2.7X 3 LD TPR (Screw) IMPLANT
SCREW LOCK IM 5X36 (Screw) ×2 IMPLANT
SCREW LOCK IM NAIL 5X34 (Screw) IMPLANT
SCREW LOCK X25 36X5X IM NL (Screw) IMPLANT
SCREW LOCKING 2.7X12MM (Screw) ×4 IMPLANT
SCREW LOCKING 2.7X14 (Screw) ×6 IMPLANT
SCREW NONLOCK 2.7X20MM (Screw) IMPLANT
SCREW SHANZ 5X250MM (Screw) IMPLANT
SPIKE FLUID TRANSFER (MISCELLANEOUS) IMPLANT
SPLINT PLASTER GYPS XFAST 3X15 (CAST SUPPLIES) IMPLANT
STAPLER VISISTAT 35W (STAPLE) ×3 IMPLANT
STOCKINETTE IMPERVIOUS LG (DRAPES) IMPLANT
SUCTION FRAZIER HANDLE 10FR (MISCELLANEOUS) ×2
SUCTION TUBE FRAZIER 10FR DISP (MISCELLANEOUS) ×3 IMPLANT
SUT ETHILON 2 0 FS 18 (SUTURE) ×3 IMPLANT
SUT ETHILON 3 0 PS 1 (SUTURE) IMPLANT
SUT PDS AB 2-0 CT1 27 (SUTURE) IMPLANT
SUT VIC AB 0 CT1 27 (SUTURE) ×4
SUT VIC AB 0 CT1 27XBRD ANBCTR (SUTURE) ×3 IMPLANT
SUT VIC AB 1 CT1 27 (SUTURE) ×2
SUT VIC AB 1 CT1 27XBRD ANBCTR (SUTURE) ×3 IMPLANT
SUT VIC AB 2-0 CT1 27 (SUTURE) ×4
SUT VIC AB 2-0 CT1 TAPERPNT 27 (SUTURE) ×3 IMPLANT
SYR CONTROL 10ML LL (SYRINGE) IMPLANT
TOWEL GREEN STERILE (TOWEL DISPOSABLE) ×6 IMPLANT
TOWEL GREEN STERILE FF (TOWEL DISPOSABLE) ×3 IMPLANT
TUBE CONNECTING 12X1/4 (SUCTIONS) ×3 IMPLANT
UNDERPAD 30X36 HEAVY ABSORB (UNDERPADS AND DIAPERS) ×3 IMPLANT
WATER STERILE IRR 1000ML POUR (IV SOLUTION) ×3 IMPLANT

## 2022-06-01 NOTE — Progress Notes (Signed)
Trauma Event Note   Despite multiple attempts, I was unable to get in touch with anyone in ortho trauma or sports medicine regarding traction orders. Will have to be addressed in rounds.     O   Trauma Response RN  Please call TRN at [phone] for further assistance.

## 2022-06-01 NOTE — Procedures (Signed)
   Procedure Note  Date: 06/01/2022  Procedure: central venous catheter placement--left, subclavian vein, without ultrasound guidance  Pre-op diagnosis: hypotension, need for administration of vasopressors Post-op diagnosis: same  Surgeon: Diamantina Monks, MD  Anesthesia: local  EBL: <5cc Drains/Implants: triple lumen central venous catheter  Description of procedure: Time-out was performed verifying correct patient, procedure, site, laterality, and signature of informed consent. The left upper chest was prepped and draped in the usual sterile fashion. Five ccs of local anesthetic was infiltrated at the site of venous access. The left subclavian vein was accessed using an introducer needle and a guidewire passed through the needle. The needle was removed and a skin nick was made. The tract was dilated and the central venous catheter advanced over the guidewire followed by removal of the guidewire. All ports drew blood easily and all were flushed with saline. The catheter was secured to the skin with suture and a sterile dressing. The patient tolerated the procedure well. There were no immediate complications. Follow up chest x-ray was ordered to confirm positioning and the absence of a pneumothorax.   Diamantina Monks, MD General and Trauma Surgery [facility]

## 2022-06-01 NOTE — Transfer of Care (Signed)
Immediate Anesthesia Transfer of Care Note  Patient: HAEDYN BREAU  Procedure(s) Performed: OPEN REDUCTION INTERNAL FIXATION (ORIF) DISTAL RADIUS FRACTURE (Right) INTRAMEDULLARY (IM) NAIL FEMORAL (Left)  Patient Location: ICU  Anesthesia Type:General  Level of Consciousness: sedated and unresponsive  Airway & Oxygen Therapy: Patient remains intubated per anesthesia plan and Patient placed on Ventilator (see vital sign flow sheet for setting)  Post-op Assessment: Report given to RN and Post -op Vital signs reviewed and stable  Post vital signs: Reviewed and stable  Last Vitals:  Vitals Value Taken Time  BP    Temp    Pulse 72 06/01/22 1620  Resp 16 06/01/22 1620  SpO2 100 % 06/01/22 1620    Last Pain:  Vitals:   06/01/22 0907  TempSrc:   PainSc: 1          Complications: No notable events documented.
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
## 2022-06-01 NOTE — Progress Notes (Signed)
Brief Nutrition Follow-up:  12/14 Admitted, Ex Lap with repair of 15 cm traumatic diaphragmatic hernia, L chest tube placement, pelvic repair, right knee  repair  Back to OR this AM for further ortho repairs (R wrist and L femur). Pt remains on vent support  RN reports trickle TF started last night after pt returned from OR but only infused for a few hours before being held for procedure today  Good UOP, chest tube with 170 mL in 24 hours No BM, abdomen soft. No bowel injury present on admit  Interventions:  Recommend restarting trickle TF of Pivot 1.5 at 20 ml/hr after pt returns from OR Goal TF: Pivot 1.5 at 65 ml/hr which provides 146 g of protein, 2340 kcals and 1186 mL of free water  Increase Pro-Source TF20 60 mL to q 4 hours, each packet provides 20 g of protein and 80 kcals, to meet protein needs. Plan to d/c Pro-Source once pt tolerating TF titration to goal rate Add MVI with Minerals daily  Romelle Starcher MS, RDN, LDN, CNSC Registered Dietitian 3 Clinical Nutrition RD Pager and On-Call Pager Number Located in [place]

## 2022-06-01 NOTE — Progress Notes (Signed)
Patient stable overnight. Plan for eft femur IM nailing and right distal radius repair.  The risks and benefits of surgery were discussed with the patient's mother and father for left femur IM nailing and right distal radius repairs, including the possibility of infection, nerve injury, vessel injury, wound breakdown, arthritis, symptomatic hardware, DVT/ PE, loss of motion, malunion, nonunion, and need for further surgery among others. These risks were acknowledged and consent provided to proceed.   Myrene Galas, MD [facility], [facility] [phone]

## 2022-06-01 NOTE — Anesthesia Postprocedure Evaluation (Signed)
Anesthesia Post Note  Patient: Alyssa Lopez  Procedure(s) Performed: OPEN REDUCTION INTERNAL FIXATION (ORIF) DISTAL RADIUS FRACTURE (Right) INTRAMEDULLARY (IM) NAIL FEMORAL (Left)     Patient location during evaluation: PACU Anesthesia Type: General Level of consciousness: awake Pain management: pain level controlled Vital Signs Assessment: post-procedure vital signs reviewed and stable Respiratory status: spontaneous breathing Cardiovascular status: stable Postop Assessment: no apparent nausea or vomiting Anesthetic complications: no   No notable events documented.  Last Vitals:  Vitals:   06/01/22 1100 06/01/22 1620  BP:    Pulse: (!) 110 72  Resp: 19 16  Temp: (!) 36.4 C   SpO2: 100% 100%    Last Pain:  Vitals:   06/01/22 0907  TempSrc:   PainSc: 1                   

## 2022-06-01 NOTE — Anesthesia Postprocedure Evaluation (Signed)
Anesthesia Post Note  Patient: Alyssa Lopez  Procedure(s) Performed: EXPLORATORY LAPAROTOMY repair of left diaphragmtic hernia, insertion of left chest tube (Abdomen)     Anesthesia Type: General Anesthetic complications: no Comments: Unable to post op patient, she remains intubated and is back to the OR today for further care of her pelvic and femur fractures.    No notable events documented.  Last Vitals:  Vitals:   06/01/22 1000 06/01/22 1100  BP:    Pulse: 99 (!) 110  Resp: 15 19  Temp: (!) 35.9 C (!) 36.4 C  SpO2: 100% 100%    Last Pain:  Vitals:   06/01/22 0907  TempSrc:   PainSc: 1                  ,E. 

## 2022-06-01 NOTE — Anesthesia Procedure Notes (Signed)
Date/Time: 06/01/2022 11:16 AM  Performed by: Waynard Edwards, CRNAPre-anesthesia Checklist: Patient identified, Emergency Drugs available, Suction available and Patient being monitored Patient Re-evaluated:Patient Re-evaluated prior to induction Oxygen Delivery Method: Circle system utilized Preoxygenation: Pre-oxygenation with 100% oxygen Induction Type: Inhalational induction with existing ETT Placement Confirmation: positive ETCO2 and breath sounds checked- equal and bilateral Dental Injury: Teeth and Oropharynx as per pre-operative assessment

## 2022-06-01 NOTE — Anesthesia Preprocedure Evaluation (Signed)
Anesthesia Evaluation  Patient identified by MRN, date of birth, ID band Patient unresponsive  General Assessment Comment:History noted Dr. Zadie Cleverly documentation limited or incomplete due to emergent nature of procedure.  Airway Mallampati: Intubated       Dental   Pulmonary           Cardiovascular      Neuro/Psych  Neuromuscular disease    GI/Hepatic   Endo/Other    Renal/GU      Musculoskeletal   Abdominal   Peds  Hematology   Anesthesia Other Findings   Reproductive/Obstetrics                             Anesthesia Physical Anesthesia Plan  ASA: 3  Anesthesia Plan: General   Post-op Pain Management:    Induction: Intravenous  PONV Risk Score and Plan: Treatment may vary due to age or medical condition and Ondansetron  Airway Management Planned: Oral ETT  Additional Equipment:   Intra-op Plan:   Post-operative Plan: Post-operative intubation/ventilation  Informed Consent: I have reviewed the patients History and Physical, chart, labs and discussed the procedure including the risks, benefits and alternatives for the proposed anesthesia with the patient or authorized representative who has indicated his/her understanding and acceptance.       Plan Discussed with: Anesthesiologist and CRNA  Anesthesia Plan Comments:        Anesthesia Quick Evaluation

## 2022-06-01 NOTE — Anesthesia Postprocedure Evaluation (Signed)
Anesthesia Post Note  Patient: Alyssa Lopez  Procedure(s) Performed: OPEN REDUCTION INTERNAL FIXATION (ORIF) DISTAL RADIUS FRACTURE (Right) INTRAMEDULLARY (IM) NAIL FEMORAL (Left)     Patient location during evaluation: ICU Anesthesia Type: General Level of consciousness: patient remains intubated per anesthesia plan Pain management: pain level controlled Vital Signs Assessment: post-procedure vital signs reviewed and stable Respiratory status: patient remains intubated per anesthesia plan Cardiovascular status: stable Postop Assessment: adequate PO intake Anesthetic complications: no   No notable events documented.  Last Vitals:  Vitals:   06/01/22 1100 06/01/22 1620  BP:    Pulse: (!) 110 72  Resp: 19 16  Temp: (!) 36.4 C   SpO2: 100% 100%    Last Pain:  Vitals:   06/01/22 0907  TempSrc:   PainSc: 1                   

## 2022-06-02 ENCOUNTER — Inpatient Hospital Stay (HOSPITAL_COMMUNITY): Payer: BC Managed Care – PPO

## 2022-06-02 LAB — TYPE AND SCREEN
ABO/RH(D): O POS
Antibody Screen: NEGATIVE
Unit division: 0
Unit division: 0
Unit division: 0
Unit division: 0
Unit division: 0
Unit division: 0
Unit division: 0
Unit division: 0
Unit division: 0

## 2022-06-02 LAB — BPAM RBC
Blood Product Expiration Date: 202312172359
Blood Product Expiration Date: 202312182359
Blood Product Expiration Date: 202312182359
Blood Product Expiration Date: 202312192359
Blood Product Expiration Date: 202312232359
Blood Product Expiration Date: 202312272359
Blood Product Expiration Date: 202401092359
Blood Product Expiration Date: 202401122359
Blood Product Expiration Date: 202401122359
ISSUE DATE / TIME: 202312140138
ISSUE DATE / TIME: 202312140138
ISSUE DATE / TIME: 202312140241
ISSUE DATE / TIME: 202312140241
ISSUE DATE / TIME: 202312140241
ISSUE DATE / TIME: 202312140241
ISSUE DATE / TIME: 202312141821
ISSUE DATE / TIME: 202312151220
ISSUE DATE / TIME: 202312151220
Unit Type and Rh: 5100
Unit Type and Rh: 5100
Unit Type and Rh: 5100
Unit Type and Rh: 9500
Unit Type and Rh: 9500
Unit Type and Rh: 9500
Unit Type and Rh: 9500
Unit Type and Rh: 9500
Unit Type and Rh: 9500

## 2022-06-02 LAB — BASIC METABOLIC PANEL
Anion gap: 6 (ref 5–15)
BUN: 11 mg/dL (ref 6–20)
CO2: 27 mmol/L (ref 22–32)
Calcium: 8.3 mg/dL — ABNORMAL LOW (ref 8.9–10.3)
Chloride: 109 mmol/L (ref 98–111)
Creatinine, Ser: 0.66 mg/dL (ref 0.44–1.00)
GFR, Estimated: >60 mL/min (ref >60)
Glucose, Bld: 118 mg/dL — ABNORMAL HIGH (ref 70–99)
Potassium: 3.6 mmol/L (ref 3.5–5.1)
Sodium: 142 mmol/L (ref 135–145)

## 2022-06-02 LAB — CBC
HCT: 27 % — ABNORMAL LOW (ref 36.0–46.0)
Hemoglobin: 8.9 g/dL — ABNORMAL LOW (ref 12.0–15.0)
MCH: 29.7 pg (ref 26.0–34.0)
MCHC: 33 g/dL (ref 30.0–36.0)
MCV: 90 fL (ref 80.0–100.0)
Platelets: 109 10*3/uL — ABNORMAL LOW (ref 150–400)
RBC: 3 MIL/uL — ABNORMAL LOW (ref 3.87–5.11)
RDW: 15.5 % (ref 11.5–15.5)
WBC: 8.8 10*3/uL (ref 4.0–10.5)
nRBC: 0 % (ref 0.0–0.2)

## 2022-06-02 LAB — GLUCOSE, CAPILLARY
Glucose-Capillary: 111 mg/dL — ABNORMAL HIGH (ref 70–99)
Glucose-Capillary: 114 mg/dL — ABNORMAL HIGH (ref 70–99)
Glucose-Capillary: 119 mg/dL — ABNORMAL HIGH (ref 70–99)
Glucose-Capillary: 108 mg/dL — ABNORMAL HIGH (ref 70–99)

## 2022-06-02 LAB — MAGNESIUM: Magnesium: 2.3 mg/dL (ref 1.7–2.4)

## 2022-06-02 LAB — VITAMIN D 25 HYDROXY (VIT D DEFICIENCY, FRACTURES): Vit D, 25-Hydroxy: 10.16 ng/mL — ABNORMAL LOW (ref 30–100)

## 2022-06-02 MED ORDER — POTASSIUM & SODIUM PHOSPHATES 280-160-250 MG PO PACK
2.0000 | PACK | Freq: Three times a day (TID) | ORAL | Status: DC
  Administered 2022-06-02 – 2022-06-05 (×15): 2
  Filled 2022-06-02 (×18): qty 2

## 2022-06-02 MED ORDER — IPRATROPIUM-ALBUTEROL 0.5-2.5 (3) MG/3ML IN SOLN
3.0000 mL | Freq: Four times a day (QID) | RESPIRATORY_TRACT | Status: DC | PRN
  Administered 2022-06-02 – 2022-06-05 (×3): 3 mL via RESPIRATORY_TRACT
  Filled 2022-06-02 (×2): qty 3

## 2022-06-02 MED ORDER — SODIUM CHLORIDE 0.9 % IV SOLN: 2.0000 g | INTRAVENOUS | Status: DC

## 2022-06-02 MED ORDER — PANTOPRAZOLE SODIUM 40 MG IV SOLR
40.0000 mg | INTRAVENOUS | Status: DC
  Administered 2022-06-02 – 2022-06-11 (×10): 40 mg via INTRAVENOUS
  Filled 2022-06-02 (×10): qty 10

## 2022-06-02 NOTE — Progress Notes (Signed)
Orthopedic Tech Progress Note Patient Details:  Alyssa Lopez 13-Apr-2001 [id number] Padded posterior long arm splint was applied per PA request to this patient's left arm.  Ortho Devices Type of Ortho Device: Long arm splint Ortho Device/Splint Location: Left arm Ortho Device/Splint Interventions: Application   Post Interventions Patient Tolerated: Well   E  06/02/2022, 11:09 AM

## 2022-06-02 NOTE — Progress Notes (Signed)
RT and RN transported pt to MRI and back to [facility]. No complications.
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
## 2022-06-02 NOTE — Progress Notes (Signed)
Ventilator pt transported from 2H11 to CT2 and back without any complications.

## 2022-06-03 ENCOUNTER — Inpatient Hospital Stay (HOSPITAL_COMMUNITY): Payer: BC Managed Care – PPO

## 2022-06-03 DIAGNOSIS — S27808A Other injury of diaphragm, initial encounter: Secondary | ICD-10-CM

## 2022-06-03 DIAGNOSIS — R609 Edema, unspecified: Secondary | ICD-10-CM

## 2022-06-03 DIAGNOSIS — I420 Dilated cardiomyopathy: Secondary | ICD-10-CM

## 2022-06-03 LAB — GLUCOSE, CAPILLARY
Glucose-Capillary: 110 mg/dL — ABNORMAL HIGH (ref 70–99)
Glucose-Capillary: 113 mg/dL — ABNORMAL HIGH (ref 70–99)
Glucose-Capillary: 84 mg/dL (ref 70–99)
Glucose-Capillary: 88 mg/dL (ref 70–99)
Glucose-Capillary: 111 mg/dL — ABNORMAL HIGH (ref 70–99)

## 2022-06-03 LAB — POCT I-STAT 7, (LYTES, BLD GAS, ICA,H+H)
Acid-Base Excess: 0 mmol/L (ref 0.0–2.0)
Acid-Base Excess: 0 mmol/L (ref 0.0–2.0)
Acid-Base Excess: 2 mmol/L (ref 0.0–2.0)
Bicarbonate: 24.4 mmol/L (ref 20.0–28.0)
Bicarbonate: 26.3 mmol/L (ref 20.0–28.0)
Bicarbonate: 28.7 mmol/L — ABNORMAL HIGH (ref 20.0–28.0)
Calcium, Ion: 1.16 mmol/L (ref 1.15–1.40)
Calcium, Ion: 1.17 mmol/L (ref 1.15–1.40)
Calcium, Ion: 1.2 mmol/L (ref 1.15–1.40)
HCT: 21 % — ABNORMAL LOW (ref 36.0–46.0)
HCT: 24 % — ABNORMAL LOW (ref 36.0–46.0)
HCT: 24 % — ABNORMAL LOW (ref 36.0–46.0)
Hemoglobin: 7.1 g/dL — ABNORMAL LOW (ref 12.0–15.0)
Hemoglobin: 8.2 g/dL — ABNORMAL LOW (ref 12.0–15.0)
Hemoglobin: 8.2 g/dL — ABNORMAL LOW (ref 12.0–15.0)
O2 Saturation: 98 %
O2 Saturation: 99 %
O2 Saturation: 99 %
Patient temperature: 37.4
Patient temperature: 98
Potassium: 3.5 mmol/L (ref 3.5–5.1)
Potassium: 3.6 mmol/L (ref 3.5–5.1)
Potassium: 3.7 mmol/L (ref 3.5–5.1)
Sodium: 143 mmol/L (ref 135–145)
Sodium: 146 mmol/L — ABNORMAL HIGH (ref 135–145)
Sodium: 147 mmol/L — ABNORMAL HIGH (ref 135–145)
TCO2: 26 mmol/L (ref 22–32)
TCO2: 28 mmol/L (ref 22–32)
TCO2: 30 mmol/L (ref 22–32)
pCO2 arterial: 38 mmHg (ref 32–48)
pCO2 arterial: 49.6 mmHg — ABNORMAL HIGH (ref 32–48)
pCO2 arterial: 55 mmHg — ABNORMAL HIGH (ref 32–48)
pH, Arterial: 7.324 — ABNORMAL LOW (ref 7.35–7.45)
pH, Arterial: 7.335 — ABNORMAL LOW (ref 7.35–7.45)
pH, Arterial: 7.417 (ref 7.35–7.45)
pO2, Arterial: 118 mmHg — ABNORMAL HIGH (ref 83–108)
pO2, Arterial: 149 mmHg — ABNORMAL HIGH (ref 83–108)
pO2, Arterial: 163 mmHg — ABNORMAL HIGH (ref 83–108)

## 2022-06-03 LAB — BASIC METABOLIC PANEL
Anion gap: 6 (ref 5–15)
Anion gap: 6 (ref 5–15)
BUN: 14 mg/dL (ref 6–20)
BUN: 16 mg/dL (ref 6–20)
CO2: 27 mmol/L (ref 22–32)
CO2: 29 mmol/L (ref 22–32)
Calcium: 7.9 mg/dL — ABNORMAL LOW (ref 8.9–10.3)
Calcium: 7.9 mg/dL — ABNORMAL LOW (ref 8.9–10.3)
Chloride: 110 mmol/L (ref 98–111)
Chloride: 110 mmol/L (ref 98–111)
Creatinine, Ser: 0.48 mg/dL (ref 0.44–1.00)
Creatinine, Ser: 0.55 mg/dL (ref 0.44–1.00)
GFR, Estimated: >60 mL/min (ref >60)
GFR, Estimated: >60 mL/min (ref >60)
Glucose, Bld: 117 mg/dL — ABNORMAL HIGH (ref 70–99)
Glucose, Bld: 111 mg/dL — ABNORMAL HIGH (ref 70–99)
Potassium: 3.5 mmol/L (ref 3.5–5.1)
Potassium: 3.4 mmol/L — ABNORMAL LOW (ref 3.5–5.1)
Sodium: 143 mmol/L (ref 135–145)
Sodium: 145 mmol/L (ref 135–145)

## 2022-06-03 LAB — ECHOCARDIOGRAM COMPLETE
Area-P 1/2: 5.88 cm2
Height: 62 in
S' Lateral: 3.2 cm
Weight: 3739 oz

## 2022-06-03 LAB — CBC
HCT: 25.7 % — ABNORMAL LOW (ref 36.0–46.0)
Hemoglobin: 8.3 g/dL — ABNORMAL LOW (ref 12.0–15.0)
MCH: 29.6 pg (ref 26.0–34.0)
MCHC: 32.3 g/dL (ref 30.0–36.0)
MCV: 91.8 fL (ref 80.0–100.0)
Platelets: 123 10*3/uL — ABNORMAL LOW (ref 150–400)
RBC: 2.8 MIL/uL — ABNORMAL LOW (ref 3.87–5.11)
RDW: 15.4 % (ref 11.5–15.5)
WBC: 5.5 10*3/uL (ref 4.0–10.5)
nRBC: 0 % (ref 0.0–0.2)

## 2022-06-03 LAB — PROCALCITONIN: Procalcitonin: 1.62 ng/mL

## 2022-06-03 LAB — MAGNESIUM
Magnesium: 1.9 mg/dL (ref 1.7–2.4)
Magnesium: 1.9 mg/dL (ref 1.7–2.4)

## 2022-06-03 LAB — TRIGLYCERIDES: Triglycerides: 251 mg/dL — ABNORMAL HIGH (ref <150)

## 2022-06-03 MED ORDER — VANCOMYCIN HCL 750 MG/150ML IV SOLN
750.0000 mg | Freq: Three times a day (TID) | INTRAVENOUS | Status: DC
  Administered 2022-06-04 (×2): 750 mg via INTRAVENOUS
  Filled 2022-06-03 (×4): qty 150

## 2022-06-03 MED ORDER — MIDAZOLAM HCL 2 MG/2ML IJ SOLN
2.0000 mg | INTRAMUSCULAR | Status: DC | PRN
  Administered 2022-06-03: 2 mg via INTRAVENOUS
  Filled 2022-06-03: qty 2

## 2022-06-03 MED ORDER — FUROSEMIDE 10 MG/ML IJ SOLN
60.0000 mg | Freq: Two times a day (BID) | INTRAMUSCULAR | Status: AC
  Administered 2022-06-03 – 2022-06-04 (×3): 60 mg via INTRAVENOUS
  Filled 2022-06-03 (×4): qty 6

## 2022-06-03 MED ORDER — VANCOMYCIN HCL IN DEXTROSE 750-5 MG/150ML-% IV SOLN
750.0000 mg | Freq: Three times a day (TID) | INTRAVENOUS | Status: DC
  Filled 2022-06-03 (×2): qty 150

## 2022-06-03 MED ORDER — VANCOMYCIN HCL 1.5 G IV SOLR
1500.0000 mg | Freq: Once | INTRAVENOUS | Status: AC
  Administered 2022-06-03: 1500 mg via INTRAVENOUS
  Filled 2022-06-03: qty 30

## 2022-06-03 MED ORDER — ORAL CARE MOUTH RINSE: 15.0000 mL | OROMUCOSAL | Status: DC | PRN

## 2022-06-03 MED ORDER — VECURONIUM BROMIDE 10 MG IV SOLR
10.0000 mg | INTRAVENOUS | Status: DC | PRN
  Administered 2022-06-03 – 2022-06-04 (×4): 10 mg via INTRAVENOUS
  Filled 2022-06-03 (×4): qty 10

## 2022-06-03 MED ORDER — ARTIFICIAL TEARS OPHTHALMIC OINT
1.0000 | TOPICAL_OINTMENT | Freq: Three times a day (TID) | OPHTHALMIC | Status: DC
  Administered 2022-06-03 – 2022-06-08 (×13): 1 via OPHTHALMIC
  Filled 2022-06-03 (×2): qty 3.5

## 2022-06-03 MED ORDER — ORAL CARE MOUTH RINSE
15.0000 mL | OROMUCOSAL | Status: DC
  Administered 2022-06-03: 15 mL via OROMUCOSAL

## 2022-06-03 MED ORDER — PROSOURCE TF20 ENFIT COMPATIBL EN LIQD
60.0000 mL | Freq: Four times a day (QID) | ENTERAL | Status: DC
  Administered 2022-06-03 (×3): 60 mL
  Filled 2022-06-03 (×4): qty 60

## 2022-06-03 MED ORDER — IPRATROPIUM-ALBUTEROL 0.5-2.5 (3) MG/3ML IN SOLN
3.0000 mL | RESPIRATORY_TRACT | Status: DC | PRN
  Administered 2022-06-07: 3 mL via RESPIRATORY_TRACT
  Filled 2022-06-03 (×3): qty 3

## 2022-06-03 MED ORDER — MAGNESIUM SULFATE IN D5W 1-5 GM/100ML-% IV SOLN
1.0000 g | Freq: Once | INTRAVENOUS | Status: AC
  Administered 2022-06-03: 1 g via INTRAVENOUS
  Filled 2022-06-03: qty 100

## 2022-06-03 MED ORDER — PIVOT 1.5 CAL PO LIQD
1000.0000 mL | ORAL | Status: DC
  Administered 2022-06-03: 1000 mL
  Filled 2022-06-03 (×2): qty 1000

## 2022-06-03 MED ORDER — FUROSEMIDE 10 MG/ML IJ SOLN
20.0000 mg | Freq: Once | INTRAMUSCULAR | Status: DC
  Filled 2022-06-03: qty 2

## 2022-06-03 MED ORDER — SODIUM CHLORIDE 0.9 % IV SOLN
2.0000 g | Freq: Three times a day (TID) | INTRAVENOUS | Status: DC
  Administered 2022-06-03 – 2022-06-06 (×9): 2 g via INTRAVENOUS
  Filled 2022-06-03 (×10): qty 12.5

## 2022-06-03 MED ORDER — PROPOFOL 1000 MG/100ML IV EMUL
5.0000 ug/kg/min | INTRAVENOUS | Status: DC
  Administered 2022-06-03: 60 ug/kg/min via INTRAVENOUS
  Filled 2022-06-03: qty 100

## 2022-06-03 MED ORDER — POTASSIUM CHLORIDE 20 MEQ PO PACK
40.0000 meq | PACK | Freq: Once | ORAL | Status: AC
  Administered 2022-06-03: 40 meq via ORAL
  Filled 2022-06-03: qty 2

## 2022-06-03 MED ORDER — MIDAZOLAM-SODIUM CHLORIDE 100-0.9 MG/100ML-% IV SOLN
0.5000 mg/h | INTRAVENOUS | Status: DC
  Administered 2022-06-03: 8 mg/h via INTRAVENOUS
  Administered 2022-06-03: 0.5 mg/h via INTRAVENOUS
  Administered 2022-06-05: 5 mg/h via INTRAVENOUS
  Filled 2022-06-03 (×4): qty 100

## 2022-06-03 MED ORDER — POTASSIUM CHLORIDE 20 MEQ PO PACK
20.0000 meq | PACK | Freq: Once | ORAL | Status: AC
  Administered 2022-06-03: 20 meq
  Filled 2022-06-03: qty 1

## 2022-06-03 MED FILL — Sodium Chloride IV Soln 0.9%: INTRAVENOUS | Qty: 250 | Status: AC

## 2022-06-03 MED FILL — Fentanyl Citrate Preservative Free (PF) Inj 2500 MCG/50ML: INTRAMUSCULAR | Qty: 50 | Status: AC

## 2022-06-03 NOTE — Progress Notes (Signed)
eLink Physician-Brief Progress Note Patient Name: Alyssa Lopez DOB: Sep 25, 2000 MRN: [id number]   Date of Service  06/03/2022  HPI/Events of Note  K+ 3.4 on vent with OG and c-line Creatinine 0.55  eICU Interventions  Ordered potassium 40 meqs per OG     Intervention Category Intermediate Interventions: Electrolyte abnormality - evaluation and management  Darl Pikes 06/03/2022, 10:12 PM

## 2022-06-03 NOTE — Progress Notes (Signed)
Came bedside for stat echo, but patient is getting sterile procedure at this time.

## 2022-06-03 NOTE — Progress Notes (Signed)
  Echocardiogram 2D Echocardiogram has been performed.  Alyssa Lopez 06/03/2022, 10:39 AM

## 2022-06-03 NOTE — Progress Notes (Signed)
Sputum obtained and sent to lab. CPT held due to increased HR.
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
## 2022-06-03 NOTE — Procedures (Signed)
Arterial Catheter Insertion Procedure Note  Alyssa Lopez  [id number]  August 11, 2000  Date:06/03/22  Time:10:10 AM    Provider Performing: Cheri Fowler    Procedure: Insertion of Arterial Line (02111) with US guidance (55208)   Indication(s) Blood pressure monitoring and/or need for frequent ABGs  Consent Risks of the procedure as well as the alternatives and risks of each were explained to the patient and/or caregiver.  Consent for the procedure was obtained and is signed in the bedside chart  Anesthesia None   Time Out Verified patient identification, verified procedure, site/side was marked, verified correct patient position, special equipment/implants available, medications/allergies/relevant history reviewed, required imaging and test results available.   Sterile Technique Maximal sterile technique including full sterile barrier drape, hand hygiene, sterile gown, sterile gloves, mask, hair covering, sterile ultrasound probe cover (if used).   Procedure Description Area of catheter insertion was cleaned with chlorhexidine and draped in sterile fashion. With real-time ultrasound guidance an arterial catheter was placed into the right  Axillary  artery.  Appropriate arterial tracings confirmed on monitor.     Complications/Tolerance None; patient tolerated the procedure well.   EBL Minimal   Specimen(s) None

## 2022-06-03 NOTE — Progress Notes (Signed)
Orthopaedic Trauma Progress Note  SUBJECTIVE: Patient intubated.  Does open her eyes and is able to answer some questions by shaking her head, but quickly falls asleep.  Required increased vent support overnight, not cleared for surgery this AM.  OBJECTIVE:  Vitals:   06/03/22 0831 06/03/22 0845  BP: 118/88 124/63  Pulse: (!) 108 94  Resp: (!) 22 12  Temp: 98.4 F (36.9 C) (!) 97 F (36.1 C)  SpO2: (!) 85% (!) 83%    General: Intubated Respiratory: No increased work of breathing.  RLE: knee immobilizer in place, dressing CDI. 2+ DP pulse. Unable to obtain reliable motor/sensory exam RUE: splint in place, cap refill intact to all fingers, no significant edema. Unable to obtain reliable motor/sensory exam LLE: Dressing CDI. Thigh and calf compressible. Unable to obtain reliable motor/sensory exam. Foot warm and well perfused LUE: Splint in place. Mild swelling to fingers. Unable to obtain reliable motor/sensory exam. Fingers warm and well perfused  IMAGING: Stable post op imaging. Found to have left elbow/olecranon fracture 06/01/22  LABS:  Results for orders placed or performed during the hospital encounter of 05/31/22 (from the past 24 hour(s))  Glucose, capillary     Status: Abnormal   Collection Time: 06/02/22  3:46 PM  Result Value Ref Range   Glucose-Capillary 119 (H) 70 - 99 mg/dL  Glucose, capillary     Status: Abnormal   Collection Time: 06/02/22  7:53 PM  Result Value Ref Range   Glucose-Capillary 108 (H) 70 - 99 mg/dL  CBC     Status: Abnormal   Collection Time: 06/03/22  4:20 AM  Result Value Ref Range   WBC 5.5 4.0 - 10.5 K/uL   RBC 2.80 (L) 3.87 - 5.11 MIL/uL   Hemoglobin 8.3 (L) 12.0 - 15.0 g/dL   HCT 12.8 (L) 78.6 - 76.7 %   MCV 91.8 80.0 - 100.0 fL   MCH 29.6 26.0 - 34.0 pg   MCHC 32.3 30.0 - 36.0 g/dL   RDW 20.9 47.0 - 96.2 %   Platelets 123 (L) 150 - 400 K/uL   nRBC 0.0 0.0 - 0.2 %  Triglycerides     Status: Abnormal   Collection Time: 06/03/22  4:20  AM  Result Value Ref Range   Triglycerides 251 (H) <150 mg/dL  Basic metabolic panel     Status: Abnormal   Collection Time: 06/03/22  4:20 AM  Result Value Ref Range   Sodium 143 135 - 145 mmol/L   Potassium 3.5 3.5 - 5.1 mmol/L   Chloride 110 98 - 111 mmol/L   CO2 27 22 - 32 mmol/L   Glucose, Bld 117 (H) 70 - 99 mg/dL   BUN 14 6 - 20 mg/dL   Creatinine, Ser 8.36 0.44 - 1.00 mg/dL   Calcium 7.9 (L) 8.9 - 10.3 mg/dL   GFR, Estimated >62 >94 mL/min   Anion gap 6 5 - 15  Magnesium     Status: None   Collection Time: 06/03/22  4:20 AM  Result Value Ref Range   Magnesium 1.9 1.7 - 2.4 mg/dL  Glucose, capillary     Status: Abnormal   Collection Time: 06/03/22  4:24 AM  Result Value Ref Range   Glucose-Capillary 110 (H) 70 - 99 mg/dL  Glucose, capillary     Status: Abnormal   Collection Time: 06/03/22  7:56 AM  Result Value Ref Range   Glucose-Capillary 113 (H) 70 - 99 mg/dL  I-STAT 7, (LYTES, BLD GAS, ICA, H+H)  Status: Abnormal   Collection Time: 06/03/22  8:10 AM  Result Value Ref Range   pH, Arterial 7.417 7.35 - 7.45   pCO2 arterial 38.0 32 - 48 mmHg   pO2, Arterial 163 (H) 83 - 108 mmHg   Bicarbonate 24.4 20.0 - 28.0 mmol/L   TCO2 26 22 - 32 mmol/L   O2 Saturation 99 %   Acid-Base Excess 0.0 0.0 - 2.0 mmol/L   Sodium 143 135 - 145 mmol/L   Potassium 3.6 3.5 - 5.1 mmol/L   Calcium, Ion 1.16 1.15 - 1.40 mmol/L   HCT 24.0 (L) 36.0 - 46.0 %   Hemoglobin 8.2 (L) 12.0 - 15.0 g/dL   Sample type ARTERIAL     ASSESSMENT: AMBRIANA SELWAY is a 21 y.o. female, 2 Days Post-Op s/p ORIF RIGHT DISTAL RADIUS FRACTURE INTRAMEDULLARY NAIL LEFT FEMORAL ORIF RIGHT PATELLA FRACTURE ORIF UNSTABLE PELVIC RING INJURY LEFT OLECRANON FRACTURE S/P SPLINTING  CV/Blood loss: Acute blood loss anemia, Hgb 8.3 this AM. Remains stable  PLAN: Weightbearing: NWB RLE and LLE. NWB LUE. Ok to [employer] through R elbow ROM:  RUE - Elbow/shoulder ROM as tolerated. Maintain splint LUE - Maintain  splint RLE - Ankle motion as tolerated. Knee immobilizer to remain in place LLE - As tolerated  Incisional and dressing care: Dressing change PRN Showering: Hold off for now Orthopedic device(s): Splint RUE/LUE, KI RLE Pain management: per trauma service VTE prophylaxis: Lovenox, SCDs ID: [email or web address] post op   Foley/Lines: Foley. Continue IVFs per trauma Impediments to Fracture Healing: polytrauma. Open fractures. Vit D level 10, start supplementation Dispo: Continue care per trauma. Plan for return to OR once medially cleared to address L elbow. Patient will be bed to chair transfers only for 8 week   Follow - up plan:  TBD   Contact information:  Truitt Merle MD, Thyra Breed PA-C. After hours and holidays please check [email or web address] for group call information for [facility]   Thompson Caul, PA-C [phone] (office) [email or web address]

## 2022-06-03 NOTE — Progress Notes (Signed)
   06/03/22 0017  Adult Ventilator Settings  FiO2 (%) 100 %  PEEP 8 cmH20  Adult Ventilator Measurements  SpO2 96 %   Increased oxygen

## 2022-06-03 NOTE — Progress Notes (Signed)
This RN contacted on-call trauma MD multiple times regarding worsening resp status. Pt consistently satting in mid 80's. Pt w/ increased FiO2 (50>70) req, increased yellow, thick sputum, adventitious breath sounds (rhonci & wheezing) & decreased ventilator compliance. Of note, pt also febrile (tmax 38.5) & tachycardic.  Requests made for neb, sputum cx & increased sedation (fentanyl & precedex gtts maxed). Propofol gtt restarted, duoneb given, and pt lavaged with transient improvement in O2 sats. CXR w/ worsening LLL consolidation. Improvement did not last long--propofol maxed, RT increaed PEEP to 8 & FiO2 to 100. MD called again; this RN requested pulmonology consult & ABG. Orders placed for increased sedation & analgesia ceilings. Pt currently satting well on high vent settings. Sedation increased--pt no longer arousable, but ventilatory complicance still questionable. Breath stacking and intermittently low tidal volumes noted.   0430: Pt almost continuously having compliance issues with ventilator despite max sedation. Repeat CXR w/ new small apical pneumo & worsening opacities in R lung. Concern for development of ARDS. MD put in orders for prn paralytic for synchrony. Dose administered with immediate improvement in ventilator compliance & oxygen saturations. Sedation gtts titrated down given low BIS score (20s). Care ongoing.

## 2022-06-03 NOTE — Progress Notes (Signed)
Pt to unstable for CPT at this time

## 2022-06-03 NOTE — Progress Notes (Signed)
Called to bedside as the pt was requiring bagging to keep O2 sats up.  pCXR ordered Pt paralyzed Consulted CCM to assist with vent mgmt. 20mg  lasix ordered. Pt on abx  Appreciate CCM assistance.
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
## 2022-06-03 NOTE — Progress Notes (Signed)
VASCULAR LAB    Bilateral lower extremity venous duplex has been performed.  See CV proc for preliminary results.   , , RVT 06/03/2022, 12:25 PM

## 2022-06-04 ENCOUNTER — Inpatient Hospital Stay (HOSPITAL_COMMUNITY): Payer: BC Managed Care – PPO

## 2022-06-04 ENCOUNTER — Ambulatory Visit: Payer: BC Managed Care – PPO | Admitting: Psychology

## 2022-06-04 ENCOUNTER — Encounter (HOSPITAL_COMMUNITY): Payer: Self-pay

## 2022-06-04 DIAGNOSIS — F4322 Adjustment disorder with anxiety: Secondary | ICD-10-CM

## 2022-06-04 LAB — POCT I-STAT 7, (LYTES, BLD GAS, ICA,H+H)
Acid-Base Excess: 4 mmol/L — ABNORMAL HIGH (ref 0.0–2.0)
Bicarbonate: 30.1 mmol/L — ABNORMAL HIGH (ref 20.0–28.0)
Calcium, Ion: 1.21 mmol/L (ref 1.15–1.40)
HCT: 22 % — ABNORMAL LOW (ref 36.0–46.0)
Hemoglobin: 7.5 g/dL — ABNORMAL LOW (ref 12.0–15.0)
O2 Saturation: 100 %
Patient temperature: 36.5
Potassium: 3.5 mmol/L (ref 3.5–5.1)
Sodium: 147 mmol/L — ABNORMAL HIGH (ref 135–145)
TCO2: 32 mmol/L (ref 22–32)
pCO2 arterial: 54.7 mmHg — ABNORMAL HIGH (ref 32–48)
pH, Arterial: 7.347 — ABNORMAL LOW (ref 7.35–7.45)
pO2, Arterial: 308 mmHg — ABNORMAL HIGH (ref 83–108)

## 2022-06-04 LAB — GLUCOSE, CAPILLARY
Glucose-Capillary: 100 mg/dL — ABNORMAL HIGH (ref 70–99)
Glucose-Capillary: 101 mg/dL — ABNORMAL HIGH (ref 70–99)
Glucose-Capillary: 105 mg/dL — ABNORMAL HIGH (ref 70–99)
Glucose-Capillary: 106 mg/dL — ABNORMAL HIGH (ref 70–99)
Glucose-Capillary: 80 mg/dL (ref 70–99)
Glucose-Capillary: 96 mg/dL (ref 70–99)
Glucose-Capillary: 97 mg/dL (ref 70–99)

## 2022-06-04 LAB — CBC
HCT: 23.9 % — ABNORMAL LOW (ref 36.0–46.0)
Hemoglobin: 7.4 g/dL — ABNORMAL LOW (ref 12.0–15.0)
MCH: 29.4 pg (ref 26.0–34.0)
MCHC: 31 g/dL (ref 30.0–36.0)
MCV: 94.8 fL (ref 80.0–100.0)
Platelets: 128 10*3/uL — ABNORMAL LOW (ref 150–400)
RBC: 2.52 MIL/uL — ABNORMAL LOW (ref 3.87–5.11)
RDW: 15.9 % — ABNORMAL HIGH (ref 11.5–15.5)
WBC: 2.9 10*3/uL — ABNORMAL LOW (ref 4.0–10.5)
nRBC: 0 % (ref 0.0–0.2)

## 2022-06-04 LAB — BLOOD GAS, ARTERIAL
Acid-Base Excess: 6 mmol/L — ABNORMAL HIGH (ref 0.0–2.0)
Bicarbonate: 33 mmol/L — ABNORMAL HIGH (ref 20.0–28.0)
Drawn by: 164
O2 Saturation: 100 %
Patient temperature: 36.6
pCO2 arterial: 56 mmHg — ABNORMAL HIGH (ref 32–48)
pH, Arterial: 7.38 (ref 7.35–7.45)
pO2, Arterial: 202 mmHg — ABNORMAL HIGH (ref 83–108)

## 2022-06-04 LAB — BASIC METABOLIC PANEL
Anion gap: 5 (ref 5–15)
BUN: 17 mg/dL (ref 6–20)
CO2: 30 mmol/L (ref 22–32)
Calcium: 8 mg/dL — ABNORMAL LOW (ref 8.9–10.3)
Chloride: 111 mmol/L (ref 98–111)
Creatinine, Ser: 0.5 mg/dL (ref 0.44–1.00)
GFR, Estimated: >60 mL/min (ref >60)
Glucose, Bld: 123 mg/dL — ABNORMAL HIGH (ref 70–99)
Potassium: 3.7 mmol/L (ref 3.5–5.1)
Sodium: 146 mmol/L — ABNORMAL HIGH (ref 135–145)

## 2022-06-04 LAB — PHOSPHORUS: Phosphorus: 2.9 mg/dL (ref 2.5–4.6)

## 2022-06-04 LAB — MAGNESIUM: Magnesium: 2 mg/dL (ref 1.7–2.4)

## 2022-06-04 MED ORDER — POTASSIUM CHLORIDE 20 MEQ PO PACK
40.0000 meq | PACK | Freq: Once | ORAL | Status: AC
  Administered 2022-06-04: 40 meq
  Filled 2022-06-04: qty 2

## 2022-06-04 MED ORDER — QUETIAPINE FUMARATE 50 MG PO TABS
50.0000 mg | ORAL_TABLET | Freq: Two times a day (BID) | ORAL | Status: DC
  Administered 2022-06-04 (×2): 50 mg
  Filled 2022-06-04 (×2): qty 1

## 2022-06-04 MED ORDER — PIVOT 1.5 CAL PO LIQD
1000.0000 mL | ORAL | Status: DC
  Administered 2022-06-04 – 2022-06-14 (×16): 1000 mL
  Filled 2022-06-04 (×7): qty 1000

## 2022-06-04 MED ORDER — CLONAZEPAM 0.5 MG PO TABS
0.5000 mg | ORAL_TABLET | Freq: Two times a day (BID) | ORAL | Status: DC
  Administered 2022-06-04 – 2022-06-08 (×10): 0.5 mg
  Filled 2022-06-04 (×10): qty 1
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
## 2022-06-04 NOTE — Progress Notes (Signed)
Brief Nutrition Support Note  Discussed pt with Trauma MD. Molli Knock to advance tube feeding rate slowly to goal. Consult received for enteral nutrition initiation and management. Pt last seen by RD on 06/03/22. Pt currently with Pivot 1.5 tube feeds infusing at 30 ml/hr via OG tube and PROSource TF20 60 ml ordered QID. This regimen is providing 1400 kcal, 148 grams of protein, and 540 ml of H2O. No tolerance issues noted.  RD to adjust tube feeding orders: - Increase rate of Pivot 1.5 to 40 ml/hr and continue to advance rate by 10 ml q 6 hours to goal rate of 65 ml/hr (1560 ml/day) - d/c PROSource TF20  Tube feeding regimen at goal rate provides 2340 kcal, 146 grams of protein, and 1170 ml of H2O.   RD will continue to follow pt during admission.   Mertie Clause, MS, RD, LDN Inpatient Clinical Dietitian Please see AMiON for contact information.
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
## 2022-06-04 NOTE — Progress Notes (Signed)
eLink Physician-Brief Progress Note Patient Name: Alyssa Lopez DOB: 2001/04/08 MRN: [id number]   Date of Service  06/04/2022  HPI/Events of Note  ABG 7.32/55/118 (relatively unchanged from yesterday PRVC 20/400/100%/14 PEEP appears synchronized  eICU Interventions  Allowing for permissive hypercapnea with goal of lung protective vent setting     Intervention Category Intermediate Interventions: Diagnostic test evaluation  Darl Pikes 06/04/2022, 12:21 AM

## 2022-06-04 NOTE — Progress Notes (Deleted)
[facility] Counselor/Therapist Progress Note  Patient ID: Alyssa Lopez, MRN: [id number],    Date: 06/04/2022  Time Spent: 5:00pm - 5:45pm   45 minutes   Treatment Type: Individual Therapy  Reported Symptoms: stress  Mental Status Exam: Appearance:  Casual     Behavior: Appropriate  Motor: Normal  Speech/Language:  Normal Rate  Affect: Appropriate  Mood: normal  Thought process: normal  Thought content:   WNL  Sensory/Perceptual disturbances:   WNL  Orientation: oriented to person, place, time/date, and situation  Attention: Good  Concentration: Good  Memory: WNL  Fund of knowledge:  Good  Insight:   Good  Judgment:  Good  Impulse Control: Good   Risk Assessment: Danger to Self:  No Self-injurious Behavior: No Danger to Others: No Duty to Warn:no Physical Aggression / Violence:No  Access to Firearms a concern: No  Gang Involvement:No   Subjective: Pt present for face-to-face individual therapy via video Webex.  Pt consents to telehealth video session due to COVID 19 pandemic. Location of pt: home Location of therapist: home office.  Pt talked about  Pt has not had any major anxiety lately.   Pt attributes this partly to her efforts to not being as negative toward herself as she use to be.  Encouraged pt to acknowledge herself for the progress she is making.   Provided supportive therapy.      Interventions: Cognitive Behavioral Therapy and Insight-Oriented  Diagnosis:  F43.22  Plan of Care: Recommend ongoing therapy.  Pt participated in setting treatment goals.  Plan to meet monthly.   Treatment Plan (treatment plan target date:  03/13/2023) Client Abilities/Strengths  Pt is bright, engaging and motivated for therapy.  Client Treatment Preferences  Individual therapy.  Client Statement of Needs  Improve coping skills.  Symptoms  Autonomic hyperactivity (e.g., palpitations, shortness of breath, dry mouth, trouble swallowing, nausea,  diarrhea). Excessive and/or unrealistic worry that is difficult to control occurring more days than not for at least 6 months about a number of events or activities. Hypervigilance (e.g., feeling constantly on edge, experiencing concentration difficulties, having trouble falling or staying asleep, exhibiting a general state of irritability). Motor tension (e.g., restlessness, tiredness, shakiness, muscle tension). Problems Addressed  Anxiety Goals 1. Enhance ability to effectively cope with the full variety of life's worries and anxieties. 2. Learn and implement coping skills that result in a reduction of anxiety and worry, and improved daily functioning. Objective Learn to accept limitations in life and commit to tolerating, rather than avoiding, unpleasant emotions while accomplishing meaningful goals. Target Date: 2023-03-13  Frequency: Monthly Progress: 50 Modality: individual Related Interventions 1. Use techniques from Acceptance and Commitment Therapy to help client accept uncomfortable realities such as lack of complete control, imperfections, and uncertainty and tolerate unpleasant emotions and thoughts in order to accomplish value-consistent goals. Objective Learn and implement problem-solving strategies for realistically addressing worries. Target Date: 2023-03-13  Frequency: Monthly Progress: 50 Modality: individual Related Interventions 1. Assign the client a homework exercise in which he/she problem-solves a current problem.  review, reinforce success, and provide corrective feedback toward improvement. 2. Teach the client problem-solving strategies involving specifically defining a problem, generating options for addressing it, evaluating the pros and cons of each option, selecting and implementing an optional action, and reevaluating and refining the action. Objective Learn and implement calming skills to reduce overall anxiety and manage anxiety symptoms. Target Date: 2023-03-13   Frequency: Monthly Progress: 50 Modality: individual Related Interventions 1. Assign the client to read  about progressive muscle relaxation and other calming strategies in relevant books or treatment manuals (e.g., Progressive Relaxation Training by Twana First; Mastery of Your Anxiety and Worry: Workbook by Earlie Counts). 2. Assign the client homework each session in which he/she practices relaxation exercises daily, gradually applying them progressively from non-anxiety-provoking to anxiety-provoking situations; review and reinforce success while providing corrective feedback toward improvement. 3. Teach the client calming/relaxation skills (e.g., applied relaxation, progressive muscle relaxation, cue controlled relaxation; mindful breathing; biofeedback) and how to discriminate better between relaxation and tension; teach the client how to apply these skills to his/her daily life. 3. Reduce overall frequency, intensity, and duration of the anxiety so that daily functioning is not impaired. 4. Resolve the core conflict that is the source of anxiety. 5. Stabilize anxiety level while increasing ability to function on a daily basis. Diagnosis :    F43.22  Conditions For Discharge Achievement of treatment goals and objectives.   , LCSW

## 2022-06-05 ENCOUNTER — Encounter (HOSPITAL_COMMUNITY): Payer: Self-pay | Admitting: Orthopedic Surgery

## 2022-06-05 ENCOUNTER — Inpatient Hospital Stay (HOSPITAL_COMMUNITY): Payer: BC Managed Care – PPO

## 2022-06-05 LAB — BASIC METABOLIC PANEL
Anion gap: 6 (ref 5–15)
BUN: 19 mg/dL (ref 6–20)
CO2: 32 mmol/L (ref 22–32)
Calcium: 8.2 mg/dL — ABNORMAL LOW (ref 8.9–10.3)
Chloride: 111 mmol/L (ref 98–111)
Creatinine, Ser: 0.67 mg/dL (ref 0.44–1.00)
GFR, Estimated: >60 mL/min (ref >60)
Glucose, Bld: 121 mg/dL — ABNORMAL HIGH (ref 70–99)
Potassium: 3.3 mmol/L — ABNORMAL LOW (ref 3.5–5.1)
Sodium: 149 mmol/L — ABNORMAL HIGH (ref 135–145)

## 2022-06-05 LAB — GLUCOSE, CAPILLARY
Glucose-Capillary: 108 mg/dL — ABNORMAL HIGH (ref 70–99)
Glucose-Capillary: 118 mg/dL — ABNORMAL HIGH (ref 70–99)
Glucose-Capillary: 119 mg/dL — ABNORMAL HIGH (ref 70–99)
Glucose-Capillary: 121 mg/dL — ABNORMAL HIGH (ref 70–99)
Glucose-Capillary: 122 mg/dL — ABNORMAL HIGH (ref 70–99)
Glucose-Capillary: 98 mg/dL (ref 70–99)

## 2022-06-05 LAB — PHOSPHORUS: Phosphorus: 3.8 mg/dL (ref 2.5–4.6)

## 2022-06-05 LAB — MAGNESIUM: Magnesium: 2 mg/dL (ref 1.7–2.4)

## 2022-06-05 MED ORDER — METOPROLOL TARTRATE 25 MG/10 ML ORAL SUSPENSION
12.5000 mg | Freq: Two times a day (BID) | ORAL | Status: DC
  Administered 2022-06-05 – 2022-06-14 (×17): 12.5 mg
  Filled 2022-06-05 (×2): qty 5
  Filled 2022-06-05 (×5): qty 10
  Filled 2022-06-05 (×3): qty 5
  Filled 2022-06-05 (×2): qty 10
  Filled 2022-06-05: qty 5
  Filled 2022-06-05 (×2): qty 10
  Filled 2022-06-05: qty 5
  Filled 2022-06-05 (×3): qty 10

## 2022-06-05 MED ORDER — LACTATED RINGERS IV BOLUS
500.0000 mL | Freq: Once | INTRAVENOUS | Status: AC
  Administered 2022-06-05: 500 mL via INTRAVENOUS

## 2022-06-05 MED ORDER — POTASSIUM CHLORIDE 20 MEQ PO PACK
40.0000 meq | PACK | Freq: Once | ORAL | Status: AC
  Administered 2022-06-05: 40 meq
  Filled 2022-06-05: qty 2

## 2022-06-05 MED ORDER — SODIUM CHLORIDE 0.9 % IV BOLUS
1000.0000 mL | Freq: Once | INTRAVENOUS | Status: AC
  Administered 2022-06-05: 1000 mL via INTRAVENOUS

## 2022-06-05 MED ORDER — FREE WATER
200.0000 mL | Freq: Four times a day (QID) | Status: DC
  Administered 2022-06-05 – 2022-06-07 (×5): 200 mL

## 2022-06-05 MED ORDER — QUETIAPINE FUMARATE 100 MG PO TABS
100.0000 mg | ORAL_TABLET | Freq: Two times a day (BID) | ORAL | Status: DC
  Administered 2022-06-05 – 2022-06-14 (×19): 100 mg
  Filled 2022-06-05 (×19): qty 1

## 2022-06-05 MED ORDER — ALBUMIN HUMAN 25 % IV SOLN
12.5000 g | Freq: Once | INTRAVENOUS | Status: AC
  Administered 2022-06-05: 12.5 g via INTRAVENOUS
  Filled 2022-06-05: qty 50

## 2022-06-05 MED ORDER — ENOXAPARIN SODIUM 30 MG/0.3ML IJ SOSY
30.0000 mg | PREFILLED_SYRINGE | Freq: Two times a day (BID) | INTRAMUSCULAR | Status: DC
  Administered 2022-06-05 – 2022-06-22 (×35): 30 mg via SUBCUTANEOUS
  Filled 2022-06-05 (×34): qty 0.3
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
## 2022-06-06 ENCOUNTER — Inpatient Hospital Stay (HOSPITAL_COMMUNITY): Payer: BC Managed Care – PPO | Admitting: Certified Registered Nurse Anesthetist

## 2022-06-06 ENCOUNTER — Inpatient Hospital Stay (HOSPITAL_COMMUNITY): Payer: BC Managed Care – PPO

## 2022-06-06 ENCOUNTER — Other Ambulatory Visit: Payer: Self-pay

## 2022-06-06 ENCOUNTER — Other Ambulatory Visit (HOSPITAL_COMMUNITY): Payer: Self-pay

## 2022-06-06 ENCOUNTER — Encounter (HOSPITAL_COMMUNITY): Admission: EM | Disposition: A | Discharge status: Rehabilitation Facility | Payer: Self-pay | Source: Home / Self Care

## 2022-06-06 ENCOUNTER — Encounter (HOSPITAL_COMMUNITY): Payer: Self-pay | Admitting: Student

## 2022-06-06 HISTORY — PX: ORIF ELBOW FRACTURE: SHX5031

## 2022-06-06 LAB — POCT I-STAT 7, (LYTES, BLD GAS, ICA,H+H)
Acid-Base Excess: 5 mmol/L — ABNORMAL HIGH (ref 0.0–2.0)
Acid-Base Excess: 4 mmol/L — ABNORMAL HIGH (ref 0.0–2.0)
Acid-Base Excess: 7 mmol/L — ABNORMAL HIGH (ref 0.0–2.0)
Bicarbonate: 30.5 mmol/L — ABNORMAL HIGH (ref 20.0–28.0)
Bicarbonate: 28.7 mmol/L — ABNORMAL HIGH (ref 20.0–28.0)
Bicarbonate: 31.2 mmol/L — ABNORMAL HIGH (ref 20.0–28.0)
Calcium, Ion: 1.2 mmol/L (ref 1.15–1.40)
Calcium, Ion: 1.19 mmol/L (ref 1.15–1.40)
Calcium, Ion: 1.24 mmol/L (ref 1.15–1.40)
HCT: 23 % — ABNORMAL LOW (ref 36.0–46.0)
HCT: 23 % — ABNORMAL LOW (ref 36.0–46.0)
HCT: 23 % — ABNORMAL LOW (ref 36.0–46.0)
Hemoglobin: 7.8 g/dL — ABNORMAL LOW (ref 12.0–15.0)
Hemoglobin: 7.8 g/dL — ABNORMAL LOW (ref 12.0–15.0)
Hemoglobin: 7.8 g/dL — ABNORMAL LOW (ref 12.0–15.0)
O2 Saturation: 96 %
O2 Saturation: 98 %
O2 Saturation: 98 %
Patient temperature: 99.1
Patient temperature: 99
Patient temperature: 37.2
Potassium: 4.1 mmol/L (ref 3.5–5.1)
Potassium: 3.8 mmol/L (ref 3.5–5.1)
Potassium: 3.9 mmol/L (ref 3.5–5.1)
Sodium: 151 mmol/L — ABNORMAL HIGH (ref 135–145)
Sodium: 149 mmol/L — ABNORMAL HIGH (ref 135–145)
Sodium: 147 mmol/L — ABNORMAL HIGH (ref 135–145)
TCO2: 32 mmol/L (ref 22–32)
TCO2: 30 mmol/L (ref 22–32)
TCO2: 33 mmol/L — ABNORMAL HIGH (ref 22–32)
pCO2 arterial: 50.1 mmHg — ABNORMAL HIGH (ref 32–48)
pCO2 arterial: 43 mmHg (ref 32–48)
pCO2 arterial: 43.2 mmHg (ref 32–48)
pH, Arterial: 7.393 (ref 7.35–7.45)
pH, Arterial: 7.432 (ref 7.35–7.45)
pH, Arterial: 7.467 — ABNORMAL HIGH (ref 7.35–7.45)
pO2, Arterial: 85 mmHg (ref 83–108)
pO2, Arterial: 108 mmHg (ref 83–108)
pO2, Arterial: 109 mmHg — ABNORMAL HIGH (ref 83–108)

## 2022-06-06 LAB — BASIC METABOLIC PANEL
Anion gap: 7 (ref 5–15)
BUN: 19 mg/dL (ref 6–20)
CO2: 29 mmol/L (ref 22–32)
Calcium: 8.2 mg/dL — ABNORMAL LOW (ref 8.9–10.3)
Chloride: 113 mmol/L — ABNORMAL HIGH (ref 98–111)
Creatinine, Ser: 0.55 mg/dL (ref 0.44–1.00)
GFR, Estimated: >60 mL/min (ref >60)
Glucose, Bld: 101 mg/dL — ABNORMAL HIGH (ref 70–99)
Potassium: 4 mmol/L (ref 3.5–5.1)
Sodium: 149 mmol/L — ABNORMAL HIGH (ref 135–145)

## 2022-06-06 LAB — PHOSPHORUS: Phosphorus: 5 mg/dL — ABNORMAL HIGH (ref 2.5–4.6)

## 2022-06-06 LAB — GLUCOSE, CAPILLARY
Glucose-Capillary: 110 mg/dL — ABNORMAL HIGH (ref 70–99)
Glucose-Capillary: 121 mg/dL — ABNORMAL HIGH (ref 70–99)
Glucose-Capillary: 133 mg/dL — ABNORMAL HIGH (ref 70–99)
Glucose-Capillary: 97 mg/dL (ref 70–99)
Glucose-Capillary: 97 mg/dL (ref 70–99)
Glucose-Capillary: 99 mg/dL (ref 70–99)

## 2022-06-06 LAB — MAGNESIUM: Magnesium: 2 mg/dL (ref 1.7–2.4)

## 2022-06-06 LAB — CBC
HCT: 23.2 % — ABNORMAL LOW (ref 36.0–46.0)
Hemoglobin: 7.3 g/dL — ABNORMAL LOW (ref 12.0–15.0)
MCH: 29.1 pg (ref 26.0–34.0)
MCHC: 31.5 g/dL (ref 30.0–36.0)
MCV: 92.4 fL (ref 80.0–100.0)
Platelets: 195 10*3/uL (ref 150–400)
RBC: 2.51 MIL/uL — ABNORMAL LOW (ref 3.87–5.11)
RDW: 16.4 % — ABNORMAL HIGH (ref 11.5–15.5)
WBC: 7.1 10*3/uL (ref 4.0–10.5)
nRBC: 0.6 % — ABNORMAL HIGH (ref 0.0–0.2)

## 2022-06-06 LAB — PREPARE RBC (CROSSMATCH)

## 2022-06-06 LAB — CULTURE, RESPIRATORY W GRAM STAIN: Culture: NORMAL

## 2022-06-06 SURGERY — OPEN REDUCTION INTERNAL FIXATION (ORIF) ELBOW/OLECRANON FRACTURE
Anesthesia: General | Site: Elbow | Laterality: Left

## 2022-06-06 MED ORDER — PROPOFOL 10 MG/ML IV BOLUS
INTRAVENOUS | Status: AC
  Filled 2022-06-06: qty 20

## 2022-06-06 MED ORDER — MIDAZOLAM HCL 2 MG/2ML IJ SOLN
INTRAMUSCULAR | Status: AC
  Filled 2022-06-06: qty 2

## 2022-06-06 MED ORDER — FENTANYL CITRATE (PF) 250 MCG/5ML IJ SOLN
INTRAMUSCULAR | Status: AC
  Filled 2022-06-06: qty 5

## 2022-06-06 MED ORDER — FENTANYL CITRATE (PF) 2500 MCG/50ML IJ SOLN
0.0000 ug/h | Status: DC
  Administered 2022-06-06: 50 ug/h via INTRAVENOUS
  Filled 2022-06-06: qty 50

## 2022-06-06 MED ORDER — PROPOFOL 1000 MG/100ML IV EMUL
5.0000 ug/kg/min | INTRAVENOUS | Status: DC
  Administered 2022-06-06: 5 ug/kg/min via INTRAVENOUS
  Administered 2022-06-07: 10 ug/kg/min via INTRAVENOUS
  Administered 2022-06-07: 5 ug/kg/min via INTRAVENOUS
  Administered 2022-06-08: 35 ug/kg/min via INTRAVENOUS
  Administered 2022-06-08: 30 ug/kg/min via INTRAVENOUS
  Filled 2022-06-06 (×5): qty 100

## 2022-06-06 MED ORDER — PHENYLEPHRINE HCL-NACL 20-0.9 MG/250ML-% IV SOLN
INTRAVENOUS | Status: DC | PRN
  Administered 2022-06-06: 30 ug/min via INTRAVENOUS

## 2022-06-06 MED ORDER — MIDAZOLAM HCL 2 MG/2ML IJ SOLN
INTRAMUSCULAR | Status: DC | PRN
  Administered 2022-06-06: 2 mg via INTRAVENOUS

## 2022-06-06 MED ORDER — 0.9 % SODIUM CHLORIDE (POUR BTL) OPTIME
TOPICAL | Status: DC | PRN
  Administered 2022-06-06: 1000 mL

## 2022-06-06 MED ORDER — VANCOMYCIN HCL 1000 MG IV SOLR
INTRAVENOUS | Status: DC | PRN
  Administered 2022-06-06: 1000 mg

## 2022-06-06 MED ORDER — FENTANYL CITRATE (PF) 250 MCG/5ML IJ SOLN
INTRAMUSCULAR | Status: DC | PRN
  Administered 2022-06-06 (×2): 50 ug via INTRAVENOUS

## 2022-06-06 MED ORDER — FUROSEMIDE 10 MG/ML IJ SOLN
40.0000 mg | Freq: Once | INTRAMUSCULAR | Status: AC
  Administered 2022-06-06: 40 mg via INTRAVENOUS
  Filled 2022-06-06: qty 4

## 2022-06-06 MED ORDER — ROCURONIUM BROMIDE 10 MG/ML (PF) SYRINGE
PREFILLED_SYRINGE | INTRAVENOUS | Status: AC
  Filled 2022-06-06: qty 10

## 2022-06-06 MED ORDER — ONDANSETRON HCL 4 MG/2ML IJ SOLN
INTRAMUSCULAR | Status: AC
  Filled 2022-06-06: qty 2

## 2022-06-06 MED ORDER — SODIUM CHLORIDE 0.9% IV SOLUTION: Freq: Once | INTRAVENOUS | Status: DC

## 2022-06-06 MED ORDER — VANCOMYCIN HCL 1000 MG IV SOLR
INTRAVENOUS | Status: AC
  Filled 2022-06-06: qty 20

## 2022-06-06 MED ORDER — ROCURONIUM BROMIDE 100 MG/10ML IV SOLN
INTRAVENOUS | Status: DC | PRN
  Administered 2022-06-06: 70 mg via INTRAVENOUS

## 2022-06-06 MED ORDER — METOLAZONE 5 MG PO TABS
10.0000 mg | ORAL_TABLET | Freq: Once | ORAL | Status: AC
  Administered 2022-06-06: 10 mg
  Filled 2022-06-06: qty 2

## 2022-06-06 MED ORDER — SODIUM CHLORIDE 0.9 % IV SOLN: INTRAVENOUS | Status: DC | PRN

## 2022-06-06 MED ORDER — ONDANSETRON HCL 4 MG/2ML IJ SOLN
INTRAMUSCULAR | Status: DC | PRN
  Administered 2022-06-06: 4 mg via INTRAVENOUS

## 2022-06-06 MED ORDER — DEXAMETHASONE SODIUM PHOSPHATE 10 MG/ML IJ SOLN
INTRAMUSCULAR | Status: AC
  Filled 2022-06-06: qty 1

## 2022-06-06 MED ORDER — DEXAMETHASONE SODIUM PHOSPHATE 10 MG/ML IJ SOLN
INTRAMUSCULAR | Status: DC | PRN
  Administered 2022-06-06: 8 mg via INTRAVENOUS

## 2022-06-06 SURGICAL SUPPLY — 70 items
APL PRP STRL LF DISP 70% ISPRP (MISCELLANEOUS) ×2
BAG COUNTER SPONGE SURGICOUNT (BAG) ×2 IMPLANT
BAG SPNG CNTER NS LX DISP (BAG) ×1
BIT DRILL QC 2.0 SHORT EVOS SM (DRILL) IMPLANT
BIT DRILL QC 2.5MM SHRT EVO SM (DRILL) IMPLANT
BNDG ELASTIC 3X5.8 VLCR STR LF (GAUZE/BANDAGES/DRESSINGS) IMPLANT
BNDG ELASTIC 4X5.8 VLCR STR LF (GAUZE/BANDAGES/DRESSINGS) IMPLANT
BNDG ELASTIC 6X5.8 VLCR STR LF (GAUZE/BANDAGES/DRESSINGS) ×2 IMPLANT
BRUSH SCRUB EZ PLAIN DRY (MISCELLANEOUS) ×4 IMPLANT
CHLORAPREP W/TINT 26 (MISCELLANEOUS) ×2 IMPLANT
CLEANER TIP ELECTROSURG 2X2 (MISCELLANEOUS) ×2 IMPLANT
COVER SURGICAL LIGHT HANDLE (MISCELLANEOUS) ×4 IMPLANT
DRAPE C-ARM 42X72 X-RAY (DRAPES) ×2 IMPLANT
DRAPE C-ARMOR (DRAPES) ×2 IMPLANT
DRAPE INCISE IOBAN 66X45 STRL (DRAPES) IMPLANT
DRAPE ORTHO SPLIT 77X108 STRL (DRAPES) ×2
DRAPE SURG ORHT 6 SPLT 77X108 (DRAPES) ×4 IMPLANT
DRAPE U-SHAPE 47X51 STRL (DRAPES) ×2 IMPLANT
DRILL QC 2.0 SHORT EVOS SM (DRILL) ×1
DRILL QC 2.5MM SHORT EVOS SM (DRILL) ×1
DRSG MEPITEL 4X7.2 (GAUZE/BANDAGES/DRESSINGS) IMPLANT
ELECT REM PT RETURN 9FT ADLT (ELECTROSURGICAL) ×1
ELECTRODE REM PT RTRN 9FT ADLT (ELECTROSURGICAL) ×2 IMPLANT
GAUZE SPONGE 4X4 12PLY STRL (GAUZE/BANDAGES/DRESSINGS) IMPLANT
GLOVE BIO SURGEON STRL SZ 6.5 (GLOVE) ×6 IMPLANT
GLOVE BIO SURGEON STRL SZ7.5 (GLOVE) ×8 IMPLANT
GLOVE BIOGEL PI IND STRL 6.5 (GLOVE) ×2 IMPLANT
GLOVE BIOGEL PI IND STRL 7.5 (GLOVE) ×2 IMPLANT
GOWN STRL REUS W/ TWL LRG LVL3 (GOWN DISPOSABLE) ×4 IMPLANT
GOWN STRL REUS W/TWL LRG LVL3 (GOWN DISPOSABLE) ×2
K-WIRE 1.6 (WIRE) ×2
K-WIRE FX150X1.6XTROC PNT (WIRE) ×2
KIT BASIN OR (CUSTOM PROCEDURE TRAY) ×2 IMPLANT
KIT TURNOVER KIT B (KITS) ×2 IMPLANT
KWIRE FX150X1.6XTROC PNT (WIRE) IMPLANT
MANIFOLD NEPTUNE II (INSTRUMENTS) ×2 IMPLANT
NS IRRIG 1000ML POUR BTL (IV SOLUTION) ×2 IMPLANT
PACK ORTHO EXTREMITY (CUSTOM PROCEDURE TRAY) ×2 IMPLANT
PAD ARMBOARD 7.5X6 YLW CONV (MISCELLANEOUS) ×4 IMPLANT
PAD CAST 3X4 CTTN HI CHSV (CAST SUPPLIES) IMPLANT
PAD CAST 4YDX4 CTTN HI CHSV (CAST SUPPLIES) IMPLANT
PADDING CAST COTTON 3X4 STRL (CAST SUPPLIES) ×1
PADDING CAST COTTON 4X4 STRL (CAST SUPPLIES) ×1
PADDING CAST COTTON 6X4 STRL (CAST SUPPLIES) IMPLANT
PLATE OLEC EVOS 2H L 2.7X61 (Plate) IMPLANT
SCREW CORT 2.7X15 T8 ST EVOS (Screw) IMPLANT
SCREW CORT 2.7X40 STAR T8 EVOS (Screw) IMPLANT
SCREW CORTEX 2.7X15 (Screw) IMPLANT
SCREW CORTEX 3.5X24MM (Screw) IMPLANT
SCREW LOCK 2.7X30 (Screw) IMPLANT
SCREW LOCK EVOS ST 3.5X18 (Screw) IMPLANT
SPONGE T-LAP 18X18 ~~LOC~~+RFID (SPONGE) ×4 IMPLANT
STAPLER VISISTAT 35W (STAPLE) ×2 IMPLANT
STRIP CLOSURE SKIN 1/2X4 (GAUZE/BANDAGES/DRESSINGS) IMPLANT
SUCTION FRAZIER HANDLE 10FR (MISCELLANEOUS) ×1
SUCTION TUBE FRAZIER 10FR DISP (MISCELLANEOUS) ×2 IMPLANT
SUT ETHILON 3 0 PS 1 (SUTURE) IMPLANT
SUT FIBERWIRE #2 38 T-5 BLUE (SUTURE) ×1
SUT VIC AB 0 CT1 27 (SUTURE) ×1
SUT VIC AB 0 CT1 27XBRD ANBCTR (SUTURE) IMPLANT
SUT VIC AB 2-0 CT1 27 (SUTURE) ×1
SUT VIC AB 2-0 CT1 TAPERPNT 27 (SUTURE) IMPLANT
SUTURE FIBERWR #2 38 T-5 BLUE (SUTURE) IMPLANT
SYR CONTROL 10ML LL (SYRINGE) ×2 IMPLANT
TOWEL GREEN STERILE (TOWEL DISPOSABLE) ×4 IMPLANT
TOWEL GREEN STERILE FF (TOWEL DISPOSABLE) IMPLANT
TUBE CONNECTING 12X1/4 (SUCTIONS) ×2 IMPLANT
UNDERPAD 30X36 HEAVY ABSORB (UNDERPADS AND DIAPERS) ×2 IMPLANT
WATER STERILE IRR 1000ML POUR (IV SOLUTION) ×2 IMPLANT
YANKAUER SUCT BULB TIP NO VENT (SUCTIONS) ×2 IMPLANT

## 2022-06-06 NOTE — TOC Benefit Eligibility Note (Signed)
Patient Product/process development scientist completed.    The patient is currently admitted and upon discharge could be taking Eliquis 5 mg.  The current 30 day co-pay is $0.00.   The patient is currently admitted and upon discharge could be taking Xarelto 20 mg.  The current 30 day co-pay is $0.00.   The patient is insured through [employer]   Alyssa Lopez, CPHT Pharmacy Patient Advocate Specialist [facility] Patient Advocate Team Direct Number: [phone]  Fax: [phone]

## 2022-06-06 NOTE — Interval H&P Note (Signed)
History and Physical Interval Note:  06/06/2022 11:59 AM  [name]  has presented today for surgery, with the diagnosis of Left open olecranon fracture.  The various methods of treatment have been discussed with the patient and family. After consideration of risks, benefits and other options for treatment, the patient has consented to  Procedure(s): OPEN REDUCTION INTERNAL FIXATION (ORIF) ELBOW/OLECRANON FRACTURE (Left) as a surgical intervention.  The patient's history has been reviewed, patient examined, no change in status, stable for surgery.  I have reviewed the patient's chart and labs.  Questions were answered to the patient's satisfaction.     Alyssa Lopez 
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
## 2022-06-06 NOTE — Transfer of Care (Signed)
Immediate Anesthesia Transfer of Care Note  Patient: Alyssa Lopez  Procedure(s) Performed: OPEN REDUCTION INTERNAL FIXATION (ORIF) ELBOW/OLECRANON FRACTURE (Left: Elbow)  Patient Location: PACU  Anesthesia Type:General  Level of Consciousness: Patient remains intubated per anesthesia plan  Airway & Oxygen Therapy: Patient remains intubated per anesthesia plan and Patient placed on Ventilator (see vital sign flow sheet for setting)  Post-op Assessment: Report given to RN and Post -op Vital signs reviewed and stable  Post vital signs: Reviewed and stable  Last Vitals:  Vitals Value Taken Time  BP    Temp    Pulse    Resp    SpO2      Last Pain:  Vitals:   06/06/22 0446  TempSrc: Axillary  PainSc:          Complications: No notable events documented.

## 2022-06-07 ENCOUNTER — Inpatient Hospital Stay (HOSPITAL_COMMUNITY): Payer: BC Managed Care – PPO

## 2022-06-07 LAB — BASIC METABOLIC PANEL
Anion gap: 11 (ref 5–15)
BUN: 27 mg/dL — ABNORMAL HIGH (ref 6–20)
CO2: 29 mmol/L (ref 22–32)
Calcium: 9.1 mg/dL (ref 8.9–10.3)
Chloride: 100 mmol/L (ref 98–111)
Creatinine, Ser: 0.79 mg/dL (ref 0.44–1.00)
GFR, Estimated: >60 mL/min (ref >60)
Glucose, Bld: 141 mg/dL — ABNORMAL HIGH (ref 70–99)
Potassium: 4.2 mmol/L (ref 3.5–5.1)
Sodium: 140 mmol/L (ref 135–145)

## 2022-06-07 LAB — GLUCOSE, CAPILLARY
Glucose-Capillary: 117 mg/dL — ABNORMAL HIGH (ref 70–99)
Glucose-Capillary: 123 mg/dL — ABNORMAL HIGH (ref 70–99)
Glucose-Capillary: 141 mg/dL — ABNORMAL HIGH (ref 70–99)
Glucose-Capillary: 144 mg/dL — ABNORMAL HIGH (ref 70–99)
Glucose-Capillary: 148 mg/dL — ABNORMAL HIGH (ref 70–99)
Glucose-Capillary: 150 mg/dL — ABNORMAL HIGH (ref 70–99)
Glucose-Capillary: 150 mg/dL — ABNORMAL HIGH (ref 70–99)

## 2022-06-07 LAB — CBC
HCT: 25.7 % — ABNORMAL LOW (ref 36.0–46.0)
Hemoglobin: 8.2 g/dL — ABNORMAL LOW (ref 12.0–15.0)
MCH: 28.9 pg (ref 26.0–34.0)
MCHC: 31.9 g/dL (ref 30.0–36.0)
MCV: 90.5 fL (ref 80.0–100.0)
Platelets: 265 10*3/uL (ref 150–400)
RBC: 2.84 MIL/uL — ABNORMAL LOW (ref 3.87–5.11)
RDW: 16 % — ABNORMAL HIGH (ref 11.5–15.5)
WBC: 7.3 10*3/uL (ref 4.0–10.5)
nRBC: 0.6 % — ABNORMAL HIGH (ref 0.0–0.2)

## 2022-06-07 LAB — TRIGLYCERIDES: Triglycerides: 356 mg/dL — ABNORMAL HIGH (ref <150)

## 2022-06-07 LAB — MAGNESIUM: Magnesium: 2.2 mg/dL (ref 1.7–2.4)

## 2022-06-07 LAB — PHOSPHORUS: Phosphorus: 5.4 mg/dL — ABNORMAL HIGH (ref 2.5–4.6)

## 2022-06-07 MED ORDER — VITAMIN D 25 MCG (1000 UNIT) PO TABS: 1000.0000 [IU] | ORAL_TABLET | Freq: Every day | ORAL | Status: DC

## 2022-06-07 MED ORDER — ERGOCALCIFEROL 200 MCG/ML PO SOLN
50000.0000 [IU] | ORAL | Status: DC
  Administered 2022-06-07 – 2022-06-14 (×2): 50000 [IU]
  Filled 2022-06-07 (×2): qty 6.25

## 2022-06-07 MED ORDER — FREE WATER
100.0000 mL | Freq: Three times a day (TID) | Status: DC
  Administered 2022-06-07 – 2022-06-09 (×5): 100 mL

## 2022-06-07 MED ORDER — VITAMIN D (ERGOCALCIFEROL) 1.25 MG (50000 UNIT) PO CAPS
50000.0000 [IU] | ORAL_CAPSULE | ORAL | Status: DC
  Filled 2022-06-07: qty 1

## 2022-06-07 NOTE — Anesthesia Postprocedure Evaluation (Signed)
Anesthesia Post Note  Patient: Alyssa Lopez  Procedure(s) Performed: OPEN REDUCTION INTERNAL FIXATION (ORIF) ELBOW/OLECRANON FRACTURE (Left: Elbow)     Patient location during evaluation: SICU Anesthesia Type: General Level of consciousness: sedated Pain management: pain level controlled Vital Signs Assessment: post-procedure vital signs reviewed and stable Respiratory status: patient remains intubated per anesthesia plan Cardiovascular status: stable Postop Assessment: no apparent nausea or vomiting Anesthetic complications: no   No notable events documented.  Last Vitals:  Vitals:   06/07/22 1600 06/07/22 1802  BP:    Pulse: (!) 106   Resp: 13   Temp: 37.4 C   SpO2: 95% 94%    Last Pain:  Vitals:   06/07/22 0800  TempSrc: Esophageal  PainSc:    Pain Goal:                   , S
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
## 2022-06-07 NOTE — Progress Notes (Signed)
RT placed pt back in full ventilator support per MD. Pt tolerating well at this time. RN made aware.

## 2022-06-08 ENCOUNTER — Other Ambulatory Visit: Payer: Self-pay

## 2022-06-08 ENCOUNTER — Inpatient Hospital Stay (HOSPITAL_COMMUNITY): Payer: BC Managed Care – PPO

## 2022-06-08 LAB — CBC
HCT: 25.3 % — ABNORMAL LOW (ref 36.0–46.0)
Hemoglobin: 8.2 g/dL — ABNORMAL LOW (ref 12.0–15.0)
MCH: 29.2 pg (ref 26.0–34.0)
MCHC: 32.4 g/dL (ref 30.0–36.0)
MCV: 90 fL (ref 80.0–100.0)
Platelets: 344 10*3/uL (ref 150–400)
RBC: 2.81 MIL/uL — ABNORMAL LOW (ref 3.87–5.11)
RDW: 15.9 % — ABNORMAL HIGH (ref 11.5–15.5)
WBC: 15.5 10*3/uL — ABNORMAL HIGH (ref 4.0–10.5)
nRBC: 0.3 % — ABNORMAL HIGH (ref 0.0–0.2)

## 2022-06-08 LAB — TYPE AND SCREEN
ABO/RH(D): O POS
Antibody Screen: NEGATIVE
Unit division: 0
Unit division: 0

## 2022-06-08 LAB — BASIC METABOLIC PANEL
Anion gap: 10 (ref 5–15)
BUN: 30 mg/dL — ABNORMAL HIGH (ref 6–20)
CO2: 30 mmol/L (ref 22–32)
Calcium: 8.9 mg/dL (ref 8.9–10.3)
Chloride: 98 mmol/L (ref 98–111)
Creatinine, Ser: 0.6 mg/dL (ref 0.44–1.00)
GFR, Estimated: >60 mL/min (ref >60)
Glucose, Bld: 144 mg/dL — ABNORMAL HIGH (ref 70–99)
Potassium: 3.8 mmol/L (ref 3.5–5.1)
Sodium: 138 mmol/L (ref 135–145)

## 2022-06-08 LAB — BPAM RBC
Blood Product Expiration Date: 202312272359
Blood Product Expiration Date: 202312282359
ISSUE DATE / TIME: 202312201344
ISSUE DATE / TIME: 202312201344
Unit Type and Rh: 5100
Unit Type and Rh: 5100

## 2022-06-08 LAB — GLUCOSE, CAPILLARY
Glucose-Capillary: 154 mg/dL — ABNORMAL HIGH (ref 70–99)
Glucose-Capillary: 105 mg/dL — ABNORMAL HIGH (ref 70–99)
Glucose-Capillary: 90 mg/dL (ref 70–99)

## 2022-06-08 LAB — MAGNESIUM: Magnesium: 2.2 mg/dL (ref 1.7–2.4)

## 2022-06-08 LAB — PHOSPHORUS: Phosphorus: 2.5 mg/dL (ref 2.5–4.6)

## 2022-06-08 LAB — TRIGLYCERIDES: Triglycerides: 487 mg/dL — ABNORMAL HIGH (ref <150)

## 2022-06-08 MED ORDER — FUROSEMIDE 10 MG/ML IJ SOLN
60.0000 mg | Freq: Once | INTRAMUSCULAR | Status: AC
  Administered 2022-06-08: 60 mg via INTRAVENOUS
  Filled 2022-06-08: qty 6

## 2022-06-08 MED ORDER — HYDROMORPHONE HCL 1 MG/ML IJ SOLN
1.0000 mg | INTRAMUSCULAR | Status: DC | PRN
  Administered 2022-06-08 – 2022-06-09 (×6): 1 mg via INTRAVENOUS
  Filled 2022-06-08 (×6): qty 1

## 2022-06-08 MED ORDER — ORAL CARE MOUTH RINSE: 15.0000 mL | OROMUCOSAL | Status: DC | PRN
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
## 2022-06-08 NOTE — Procedures (Signed)
Extubation Procedure Note  Patient Details:   Name: Alyssa Lopez DOB: [birth year] MRN: [id number]   Airway Documentation:    Vent end date: 06/08/22 Vent end time: 1045   Evaluation  O2 sats: transiently fell during during procedure Complications: Complications of Pt able to pass air insp/exp but unable to speak other than a whisper at this time. Patient did tolerate procedure well. Bilateral Breath Sounds: Clear, Diminished   No  MD at bedside. Positive cuff leak.  Dewain Penning T 06/08/2022, 10:55 AM

## 2022-06-08 NOTE — Procedures (Signed)
Cortrak  Person Inserting Tube:  ,  C, RD Tube Type:  Cortrak - 43 inches Tube Size:  10 Tube Location:  Left nare Secured by: Bridle Technique Used to Measure Tube Placement:  Marking at nare/corner of mouth Cortrak Secured At:  67 cm   Cortrak Tube Team Note:  Consult received to place a Cortrak feeding tube.   X-ray is required, abdominal x-ray has been ordered by the Cortrak team. Please confirm tube placement before using the Cortrak tube.   If the tube becomes dislodged please keep the tube and contact the Cortrak team at [email or web address] for replacement.  If after hours and replacement cannot be delayed, place a NG tube and confirm placement with an abdominal x-ray.     P., RD, LDN, CNSC See AMiON for contact information    

## 2022-06-09 LAB — CBC
HCT: 27.3 % — ABNORMAL LOW (ref 36.0–46.0)
Hemoglobin: 8.9 g/dL — ABNORMAL LOW (ref 12.0–15.0)
MCH: 29.6 pg (ref 26.0–34.0)
MCHC: 32.6 g/dL (ref 30.0–36.0)
MCV: 90.7 fL (ref 80.0–100.0)
Platelets: 471 10*3/uL — ABNORMAL HIGH (ref 150–400)
RBC: 3.01 MIL/uL — ABNORMAL LOW (ref 3.87–5.11)
RDW: 16 % — ABNORMAL HIGH (ref 11.5–15.5)
WBC: 19.7 10*3/uL — ABNORMAL HIGH (ref 4.0–10.5)
nRBC: 0.3 % — ABNORMAL HIGH (ref 0.0–0.2)

## 2022-06-09 LAB — GLUCOSE, CAPILLARY
Glucose-Capillary: 154 mg/dL — ABNORMAL HIGH (ref 70–99)
Glucose-Capillary: 140 mg/dL — ABNORMAL HIGH (ref 70–99)
Glucose-Capillary: 122 mg/dL — ABNORMAL HIGH (ref 70–99)
Glucose-Capillary: 127 mg/dL — ABNORMAL HIGH (ref 70–99)
Glucose-Capillary: 57 mg/dL — ABNORMAL LOW (ref 70–99)
Glucose-Capillary: 142 mg/dL — ABNORMAL HIGH (ref 70–99)
Glucose-Capillary: 136 mg/dL — ABNORMAL HIGH (ref 70–99)

## 2022-06-09 LAB — BASIC METABOLIC PANEL
Anion gap: 9 (ref 5–15)
BUN: 33 mg/dL — ABNORMAL HIGH (ref 6–20)
CO2: 32 mmol/L (ref 22–32)
Calcium: 8.8 mg/dL — ABNORMAL LOW (ref 8.9–10.3)
Chloride: 96 mmol/L — ABNORMAL LOW (ref 98–111)
Creatinine, Ser: 0.75 mg/dL (ref 0.44–1.00)
GFR, Estimated: >60 mL/min (ref >60)
Glucose, Bld: 125 mg/dL — ABNORMAL HIGH (ref 70–99)
Potassium: 3.2 mmol/L — ABNORMAL LOW (ref 3.5–5.1)
Sodium: 137 mmol/L (ref 135–145)

## 2022-06-09 MED ORDER — OXYCODONE HCL 5 MG PO TABS
10.0000 mg | ORAL_TABLET | ORAL | Status: DC | PRN
  Administered 2022-06-09 (×2): 10 mg
  Administered 2022-06-09 – 2022-06-10 (×3): 15 mg
  Administered 2022-06-10: 10 mg
  Administered 2022-06-11 – 2022-06-13 (×7): 15 mg
  Filled 2022-06-09 (×3): qty 3
  Filled 2022-06-09: qty 2
  Filled 2022-06-09 (×5): qty 3
  Filled 2022-06-09: qty 2
  Filled 2022-06-09: qty 3
  Filled 2022-06-09: qty 2
  Filled 2022-06-09: qty 3

## 2022-06-09 MED ORDER — POTASSIUM CHLORIDE 10 MEQ/50ML IV SOLN
10.0000 meq | INTRAVENOUS | Status: AC
  Administered 2022-06-09 (×4): 10 meq via INTRAVENOUS
  Filled 2022-06-09 (×4): qty 50

## 2022-06-09 MED ORDER — LORAZEPAM 2 MG/ML IJ SOLN
1.0000 mg | INTRAMUSCULAR | Status: DC | PRN
  Administered 2022-06-09 – 2022-06-22 (×10): 1 mg via INTRAVENOUS
  Filled 2022-06-09 (×11): qty 1

## 2022-06-09 MED ORDER — KETOROLAC TROMETHAMINE 30 MG/ML IJ SOLN
30.0000 mg | Freq: Four times a day (QID) | INTRAMUSCULAR | Status: AC
  Administered 2022-06-09 – 2022-06-14 (×20): 30 mg via INTRAVENOUS
  Filled 2022-06-09 (×20): qty 1

## 2022-06-09 MED ORDER — TRAMADOL HCL 50 MG PO TABS
50.0000 mg | ORAL_TABLET | Freq: Four times a day (QID) | ORAL | Status: DC
  Administered 2022-06-09 – 2022-06-14 (×20): 50 mg
  Filled 2022-06-09 (×20): qty 1

## 2022-06-09 MED ORDER — HYDROMORPHONE HCL 1 MG/ML IJ SOLN
1.0000 mg | INTRAMUSCULAR | Status: DC | PRN
  Administered 2022-06-09 – 2022-06-10 (×6): 2 mg via INTRAVENOUS
  Filled 2022-06-09 (×6): qty 2

## 2022-06-09 NOTE — Progress Notes (Addendum)
Inpatient Rehab Admissions Coordinator:   Per therapy recommendations patient was screened for CIR candidacy by Megan Salon, MS, CCC-SLP I have requested MD review case. I MD feels that Pt. Could benefit from a short course of rehab focused on family and Pt. Education, as weight bearing restrictions are likely to severely limit Pt.'s ability to work on mobility and ADLs. I will place a consult for full assessment of candidacy. Note, Pt. Will need to have family able to take her home after CIR, likely at total A. With a lift.  .Please contact me any with questions.  Megan Salon, MS, CCC-SLP Rehab Admissions Coordinator  [phone] (celll) [phone] (office)
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
## 2022-06-09 NOTE — Progress Notes (Signed)
Trauma Event Note   TRN at bedside to round. Pt continuing to have difficulty with pain control. Dr. Bedelia Person notified, toradol 30MG  IV q6h scheduled. Primary RN aware.  Last imported Vital Signs BP 130/70   Pulse (!) 123   Temp 98.7 F (37.1 C) (Oral)   Resp 17   Ht 5\' 2"  (1.575 m)   Wt 216 lb 14.9 oz (98.4 kg)   LMP  (LMP Unknown) Comment: trauma  SpO2 97%   BMI 39.68 kg/m   Trending CBC Recent Labs    06/07/22 0407 06/08/22 0420 06/09/22 0157  WBC 7.3 15.5* 19.7*  HGB 8.2* 8.2* 8.9*  HCT 25.7* 25.3* 27.3*  PLT 265 344 471*    Trending Coag's No results for input(s): "APTT", "INR" in the last 72 hours.  Trending BMET Recent Labs    06/07/22 0407 06/08/22 0420 06/09/22 0157  NA 140 138 137  K 4.2 3.8 3.2*  CL 100 98 96*  CO2 29 30 32  BUN 27* 30* 33*  CREATININE 0.79 0.60 0.75  GLUCOSE 141* 144* 125*       O   Trauma Response RN  Please call TRN at [phone] for further assistance.
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
## 2022-06-09 NOTE — Progress Notes (Signed)
SLP Cancellation Note  Patient Details Name: Alyssa Lopez MRN: [id number] DOB: [birth year]   Cancelled treatment:       Reason Eval/Treat Not Completed: Patient declined, no reason specified; pt recently been given Ativan; refused PO trials; Mother stated she was "chewing and swallowing ice chips." Has Cortrak for nutrition/hydration purposes. ST will continue efforts as able.   Tressie Stalker, M.S., CCC-SLP 06/09/2022, 9:49 AM
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
## 2022-06-10 LAB — GLUCOSE, CAPILLARY
Glucose-Capillary: 114 mg/dL — ABNORMAL HIGH (ref 70–99)
Glucose-Capillary: 118 mg/dL — ABNORMAL HIGH (ref 70–99)
Glucose-Capillary: 120 mg/dL — ABNORMAL HIGH (ref 70–99)
Glucose-Capillary: 120 mg/dL — ABNORMAL HIGH (ref 70–99)
Glucose-Capillary: 121 mg/dL — ABNORMAL HIGH (ref 70–99)
Glucose-Capillary: 131 mg/dL — ABNORMAL HIGH (ref 70–99)

## 2022-06-10 LAB — CBC
HCT: 26.5 % — ABNORMAL LOW (ref 36.0–46.0)
Hemoglobin: 8.2 g/dL — ABNORMAL LOW (ref 12.0–15.0)
MCH: 28.9 pg (ref 26.0–34.0)
MCHC: 30.9 g/dL (ref 30.0–36.0)
MCV: 93.3 fL (ref 80.0–100.0)
Platelets: 595 10*3/uL — ABNORMAL HIGH (ref 150–400)
RBC: 2.84 MIL/uL — ABNORMAL LOW (ref 3.87–5.11)
RDW: 15.9 % — ABNORMAL HIGH (ref 11.5–15.5)
WBC: 22.3 10*3/uL — ABNORMAL HIGH (ref 4.0–10.5)
nRBC: 0.2 % (ref 0.0–0.2)

## 2022-06-10 LAB — BASIC METABOLIC PANEL
Anion gap: 12 (ref 5–15)
BUN: 27 mg/dL — ABNORMAL HIGH (ref 6–20)
CO2: 27 mmol/L (ref 22–32)
Calcium: 8.7 mg/dL — ABNORMAL LOW (ref 8.9–10.3)
Chloride: 98 mmol/L (ref 98–111)
Creatinine, Ser: 0.72 mg/dL (ref 0.44–1.00)
GFR, Estimated: >60 mL/min (ref >60)
Glucose, Bld: 131 mg/dL — ABNORMAL HIGH (ref 70–99)
Potassium: 3.6 mmol/L (ref 3.5–5.1)
Sodium: 137 mmol/L (ref 135–145)

## 2022-06-10 MED ORDER — ACETAMINOPHEN 160 MG/5ML PO SOLN
1000.0000 mg | Freq: Four times a day (QID) | ORAL | Status: DC
  Administered 2022-06-10 – 2022-06-14 (×16): 1000 mg via ORAL
  Filled 2022-06-10 (×15): qty 40.6

## 2022-06-10 NOTE — Progress Notes (Shared)
Inpatient Rehab Admissions Coordinator:    I spoke with Pt.'s mother about a short rehab admit for family education and training. She is interested and states that family can provide 24/7 heavy assist and that home is wheelchair accessible. I will follow up with them Tuesday for further discussion.  Megan Salon, MS, CCC-SLP Rehab Admissions Coordinator  [phone] (celll) [phone] (office)
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
## 2022-06-10 NOTE — Progress Notes (Signed)
PIV access obtained.  Kimmel RN aware of order to remove CVC.

## 2022-06-10 NOTE — Progress Notes (Signed)
2030: Emilie Trauma RN,  rounding and at bedside. Trauma RN made aware of patients increase in pain. Trauma MD notified; see new orders.
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
## 2022-06-11 ENCOUNTER — Inpatient Hospital Stay (HOSPITAL_COMMUNITY): Payer: BC Managed Care – PPO

## 2022-06-11 LAB — BASIC METABOLIC PANEL
Anion gap: 12 (ref 5–15)
BUN: 33 mg/dL — ABNORMAL HIGH (ref 6–20)
CO2: 27 mmol/L (ref 22–32)
Calcium: 8.7 mg/dL — ABNORMAL LOW (ref 8.9–10.3)
Chloride: 100 mmol/L (ref 98–111)
Creatinine, Ser: 0.65 mg/dL (ref 0.44–1.00)
GFR, Estimated: >60 mL/min (ref >60)
Glucose, Bld: 129 mg/dL — ABNORMAL HIGH (ref 70–99)
Potassium: 3.6 mmol/L (ref 3.5–5.1)
Sodium: 139 mmol/L (ref 135–145)

## 2022-06-11 LAB — GLUCOSE, CAPILLARY
Glucose-Capillary: 129 mg/dL — ABNORMAL HIGH (ref 70–99)
Glucose-Capillary: 129 mg/dL — ABNORMAL HIGH (ref 70–99)
Glucose-Capillary: 118 mg/dL — ABNORMAL HIGH (ref 70–99)
Glucose-Capillary: 120 mg/dL — ABNORMAL HIGH (ref 70–99)
Glucose-Capillary: 100 mg/dL — ABNORMAL HIGH (ref 70–99)
Glucose-Capillary: 106 mg/dL — ABNORMAL HIGH (ref 70–99)

## 2022-06-11 LAB — CBC
HCT: 24.9 % — ABNORMAL LOW (ref 36.0–46.0)
Hemoglobin: 8.2 g/dL — ABNORMAL LOW (ref 12.0–15.0)
MCH: 30 pg (ref 26.0–34.0)
MCHC: 32.9 g/dL (ref 30.0–36.0)
MCV: 91.2 fL (ref 80.0–100.0)
Platelets: 758 10*3/uL — ABNORMAL HIGH (ref 150–400)
RBC: 2.73 MIL/uL — ABNORMAL LOW (ref 3.87–5.11)
RDW: 16.8 % — ABNORMAL HIGH (ref 11.5–15.5)
WBC: 20.5 10*3/uL — ABNORMAL HIGH (ref 4.0–10.5)
nRBC: 0 % (ref 0.0–0.2)

## 2022-06-11 NOTE — Progress Notes (Signed)
Orthopedic Tech Progress Note Patient Details:  Alyssa Lopez 01/05/2001 [id number]  Patient soiled her old knee immobilizer, so I brought in a new one, RN said she'll apply once she is finish bathing patient"  Ortho Devices Type of Ortho Device: Knee Immobilizer Ortho Device/Splint Location: RLE Ortho Device/Splint Interventions: Application, Adjustment   Post Interventions Patient Tolerated: Well Instructions Provided: Care of device  Donald Pore 06/11/2022, 1:40 PM

## 2022-06-11 NOTE — Progress Notes (Signed)
Pt transferred to 4NP09 from 2H. Pt alert and orientedx4, vital signs WNL.  Pt belongings: Cell phone and red plaid blanket  Robina Ade, RN
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
## 2022-06-12 ENCOUNTER — Inpatient Hospital Stay (HOSPITAL_COMMUNITY): Payer: BC Managed Care – PPO

## 2022-06-12 LAB — BASIC METABOLIC PANEL
Anion gap: 8 (ref 5–15)
BUN: 30 mg/dL — ABNORMAL HIGH (ref 6–20)
CO2: 27 mmol/L (ref 22–32)
Calcium: 8.6 mg/dL — ABNORMAL LOW (ref 8.9–10.3)
Chloride: 105 mmol/L (ref 98–111)
Creatinine, Ser: 0.63 mg/dL (ref 0.44–1.00)
GFR, Estimated: >60 mL/min (ref >60)
Glucose, Bld: 122 mg/dL — ABNORMAL HIGH (ref 70–99)
Potassium: 4.2 mmol/L (ref 3.5–5.1)
Sodium: 140 mmol/L (ref 135–145)

## 2022-06-12 LAB — CBC
HCT: 25.3 % — ABNORMAL LOW (ref 36.0–46.0)
Hemoglobin: 7.8 g/dL — ABNORMAL LOW (ref 12.0–15.0)
MCH: 28.7 pg (ref 26.0–34.0)
MCHC: 30.8 g/dL (ref 30.0–36.0)
MCV: 93 fL (ref 80.0–100.0)
Platelets: 1021 10*3/uL (ref 150–400)
RBC: 2.72 MIL/uL — ABNORMAL LOW (ref 3.87–5.11)
RDW: 17.2 % — ABNORMAL HIGH (ref 11.5–15.5)
WBC: 16.9 10*3/uL — ABNORMAL HIGH (ref 4.0–10.5)
nRBC: 0 % (ref 0.0–0.2)

## 2022-06-12 LAB — GLUCOSE, CAPILLARY
Glucose-Capillary: 128 mg/dL — ABNORMAL HIGH (ref 70–99)
Glucose-Capillary: 122 mg/dL — ABNORMAL HIGH (ref 70–99)
Glucose-Capillary: 109 mg/dL — ABNORMAL HIGH (ref 70–99)
Glucose-Capillary: 112 mg/dL — ABNORMAL HIGH (ref 70–99)
Glucose-Capillary: 124 mg/dL — ABNORMAL HIGH (ref 70–99)
Glucose-Capillary: 122 mg/dL — ABNORMAL HIGH (ref 70–99)

## 2022-06-12 MED ORDER — FOOD THICKENER (SIMPLYTHICK)
1.0000 | Freq: Three times a day (TID) | ORAL | Status: DC
  Administered 2022-06-12 – 2022-06-14 (×7): 1 via ORAL

## 2022-06-12 MED ORDER — PANTOPRAZOLE SODIUM 40 MG PO TBEC
40.0000 mg | DELAYED_RELEASE_TABLET | Freq: Every day | ORAL | Status: DC
  Administered 2022-06-12 – 2022-06-18 (×7): 40 mg via ORAL
  Filled 2022-06-12 (×7): qty 1
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
## 2022-06-12 NOTE — Progress Notes (Signed)
Orthopedic Tech Progress Note Patient Details:  Alyssa Lopez 04/23/2001 [id number]  Ortho Devices Type of Ortho Device: [place]inette, Velcro wrist forearm splint Ortho Device/Splint Location: RUE Ortho Device/Splint Interventions: Ordered, Application, Adjustment   Post Interventions Patient Tolerated: Well Instructions Provided: Care of device  Donald Pore 06/12/2022, 1:34 PM

## 2022-06-12 NOTE — Progress Notes (Signed)
Orthopedic Tech Progress Note Patient Details:  SHERIDAN HEW [birth year] [id number] Left soft collar in patients room for RN to apply. RN is aware Patient ID: ZIONAH CRISWELL, female   DOB: 2001-03-02, 21 y.o.   MRN: [id number]  Lovett Calender 06/12/2022, 5:44 PM
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
## 2022-06-12 NOTE — Progress Notes (Signed)
Orthopedic Tech Progress Note Patient Details:  Alyssa Lopez Aug 16, 2000 [id number]  Called in order to [employer] for a ROM BRACE locked in full extension   Patient ID: Alyssa Lopez, female   DOB: 10/21/2000, 21 y.o.   MRN: [id number]  Alyssa Lopez 06/12/2022, 8:52 AM

## 2022-06-12 NOTE — Progress Notes (Signed)
Inpatient Rehab Admissions Coordinator:   I met with pt.'s mother and father  to discuss potential CIR admit and let them know that she will likely need 2 people for transfers when she returns home. They are in agreement with this. I will follow for potential admit once medically stable.   Clemens Catholic, [place], [facility] Admissions Coordinator  [phone] ([place]) [phone] (office)

## 2022-06-12 NOTE — Progress Notes (Signed)
SLP Cancellation Note  Patient Details Name: Alyssa Lopez MRN: [id number] DOB: [birth year]   Cancelled treatment:       Reason Eval/Treat Not Completed: Patient at procedure or test/unavailable  Pt is unavailable at this time - per RN, in bathroom, then going to radiology. Will continue efforts.   B. Murvin Natal, [facility], CCC-SLP Speech Language Pathologist Office: [phone]  Leigh Aurora 06/12/2022, 9:15 AM

## 2022-06-13 LAB — CBC
HCT: 23.6 % — ABNORMAL LOW (ref 36.0–46.0)
Hemoglobin: 7.6 g/dL — ABNORMAL LOW (ref 12.0–15.0)
MCH: 29.8 pg (ref 26.0–34.0)
MCHC: 32.2 g/dL (ref 30.0–36.0)
MCV: 92.5 fL (ref 80.0–100.0)
Platelets: 1144 10*3/uL (ref 150–400)
RBC: 2.55 MIL/uL — ABNORMAL LOW (ref 3.87–5.11)
RDW: 17.7 % — ABNORMAL HIGH (ref 11.5–15.5)
WBC: 15.7 10*3/uL — ABNORMAL HIGH (ref 4.0–10.5)
nRBC: 0 % (ref 0.0–0.2)

## 2022-06-13 LAB — GLUCOSE, CAPILLARY
Glucose-Capillary: 136 mg/dL — ABNORMAL HIGH (ref 70–99)
Glucose-Capillary: 109 mg/dL — ABNORMAL HIGH (ref 70–99)
Glucose-Capillary: 103 mg/dL — ABNORMAL HIGH (ref 70–99)
Glucose-Capillary: 110 mg/dL — ABNORMAL HIGH (ref 70–99)
Glucose-Capillary: 91 mg/dL (ref 70–99)
Glucose-Capillary: 112 mg/dL — ABNORMAL HIGH (ref 70–99)

## 2022-06-13 LAB — BASIC METABOLIC PANEL
Anion gap: 9 (ref 5–15)
BUN: 30 mg/dL — ABNORMAL HIGH (ref 6–20)
CO2: 24 mmol/L (ref 22–32)
Calcium: 8.6 mg/dL — ABNORMAL LOW (ref 8.9–10.3)
Chloride: 105 mmol/L (ref 98–111)
Creatinine, Ser: 0.53 mg/dL (ref 0.44–1.00)
GFR, Estimated: >60 mL/min (ref >60)
Glucose, Bld: 121 mg/dL — ABNORMAL HIGH (ref 70–99)
Potassium: 4.3 mmol/L (ref 3.5–5.1)
Sodium: 138 mmol/L (ref 135–145)

## 2022-06-13 MED ORDER — HYDROMORPHONE HCL 1 MG/ML IJ SOLN
1.0000 mg | INTRAMUSCULAR | Status: DC | PRN
  Administered 2022-06-14: 1 mg via INTRAVENOUS
  Administered 2022-06-15: 2 mg via INTRAVENOUS
  Administered 2022-06-15 – 2022-06-16 (×2): 1 mg via INTRAVENOUS
  Administered 2022-06-16: 2 mg via INTRAVENOUS
  Filled 2022-06-13 (×2): qty 1
  Filled 2022-06-13: qty 2
  Filled 2022-06-13: qty 1
  Filled 2022-06-13: qty 2

## 2022-06-13 MED ORDER — LIDOCAINE 5 % EX PTCH
1.0000 | MEDICATED_PATCH | CUTANEOUS | Status: DC
  Administered 2022-06-15 – 2022-06-22 (×8): 1 via TRANSDERMAL
  Filled 2022-06-13 (×10): qty 1

## 2022-06-13 NOTE — Progress Notes (Signed)
Inpatient Rehabilitation Admissions Coordinator   I met at bedside with patient and Mom. Patient not yet at a level to pursue CIR admit for transfer training and family education.  Danne Baxter, RN, MSN Rehab Admissions Coordinator [phone] 06/13/2022 2:39 PM
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
## 2022-06-14 LAB — GLUCOSE, CAPILLARY
Glucose-Capillary: 100 mg/dL — ABNORMAL HIGH (ref 70–99)
Glucose-Capillary: 106 mg/dL — ABNORMAL HIGH (ref 70–99)
Glucose-Capillary: 113 mg/dL — ABNORMAL HIGH (ref 70–99)
Glucose-Capillary: 127 mg/dL — ABNORMAL HIGH (ref 70–99)
Glucose-Capillary: 88 mg/dL (ref 70–99)
Glucose-Capillary: 91 mg/dL (ref 70–99)

## 2022-06-14 LAB — PATHOLOGIST SMEAR REVIEW

## 2022-06-14 MED ORDER — METHOCARBAMOL 500 MG PO TABS
1000.0000 mg | ORAL_TABLET | Freq: Three times a day (TID) | ORAL | Status: DC
  Administered 2022-06-14 – 2022-06-21 (×20): 1000 mg via ORAL
  Filled 2022-06-14 (×21): qty 2

## 2022-06-14 MED ORDER — TRAMADOL HCL 50 MG PO TABS
50.0000 mg | ORAL_TABLET | Freq: Four times a day (QID) | ORAL | Status: DC
  Administered 2022-06-14 – 2022-06-22 (×30): 50 mg via ORAL
  Filled 2022-06-14 (×32): qty 1

## 2022-06-14 MED ORDER — ERGOCALCIFEROL 200 MCG/ML PO SOLN
50000.0000 [IU] | ORAL | Status: DC
  Administered 2022-06-21: 50000 [IU] via ORAL
  Filled 2022-06-14 (×3): qty 6.25

## 2022-06-14 MED ORDER — ENSURE ENLIVE PO LIQD
237.0000 mL | Freq: Three times a day (TID) | ORAL | Status: DC
  Administered 2022-06-14 – 2022-06-21 (×7): 237 mL via ORAL
  Filled 2022-06-14 (×4): qty 237

## 2022-06-14 MED ORDER — PIVOT 1.5 CAL PO LIQD
780.0000 mL | ORAL | Status: DC
  Filled 2022-06-14: qty 1000

## 2022-06-14 MED ORDER — METOPROLOL TARTRATE 12.5 MG HALF TABLET
12.5000 mg | ORAL_TABLET | Freq: Two times a day (BID) | ORAL | Status: DC
  Administered 2022-06-14 – 2022-06-21 (×15): 12.5 mg via ORAL
  Filled 2022-06-14 (×16): qty 1

## 2022-06-14 MED ORDER — OXYCODONE HCL 5 MG PO TABS
10.0000 mg | ORAL_TABLET | ORAL | Status: DC | PRN
  Administered 2022-06-14 – 2022-06-20 (×18): 15 mg via ORAL
  Administered 2022-06-20 – 2022-06-21 (×2): 10 mg via ORAL
  Administered 2022-06-21 – 2022-06-22 (×2): 15 mg via ORAL
  Administered 2022-06-22: 10 mg via ORAL
  Administered 2022-06-22: 15 mg via ORAL
  Filled 2022-06-14 (×11): qty 3
  Filled 2022-06-14: qty 2
  Filled 2022-06-14 (×15): qty 3

## 2022-06-14 MED ORDER — PHENOL 1.4 % MT LIQD
1.0000 | OROMUCOSAL | Status: DC | PRN
  Filled 2022-06-14: qty 177

## 2022-06-14 MED ORDER — ACETAMINOPHEN 500 MG PO TABS
1000.0000 mg | ORAL_TABLET | Freq: Four times a day (QID) | ORAL | Status: DC
  Administered 2022-06-14 – 2022-06-22 (×30): 1000 mg via ORAL
  Filled 2022-06-14 (×32): qty 2

## 2022-06-14 MED ORDER — ENSURE ENLIVE PO LIQD: 237.0000 mL | Freq: Three times a day (TID) | ORAL | Status: DC

## 2022-06-14 MED ORDER — BOOST / RESOURCE BREEZE PO LIQD CUSTOM
1.0000 | Freq: Three times a day (TID) | ORAL | Status: DC
  Administered 2022-06-14: 1 via ORAL

## 2022-06-14 MED ORDER — QUETIAPINE FUMARATE 50 MG PO TABS
50.0000 mg | ORAL_TABLET | Freq: Two times a day (BID) | ORAL | Status: DC
  Administered 2022-06-14: 50 mg via ORAL
  Filled 2022-06-14: qty 1

## 2022-06-14 MED ORDER — ADULT MULTIVITAMIN W/MINERALS CH
1.0000 | ORAL_TABLET | Freq: Every day | ORAL | Status: DC
  Administered 2022-06-15 – 2022-06-22 (×8): 1 via ORAL
  Filled 2022-06-14 (×8): qty 1
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
## 2022-06-14 NOTE — TOC Progression Note (Signed)
Transition of Care (TOC) - Progression Note    Patient Details  Name: Mckinleigh M Gates MRN: [id number] Date of Birth: 08/10/2000  Transition of Care (TOC) CM/SW Contact  ,  M, RN Phone Number: 06/14/2022, 12:20pm  Clinical Narrative:    Met with patient and father, at bedside: Patient states that her pain is improving.  Plan CIR for transfer training upon medical stability and progress with therapies.    Barriers to Discharge: Continued Medical Work up  Expected Discharge Plan and Services       Living arrangements for the past 2 months: Single Family Home                                       Social Determinants of Health (SDOH) Interventions SDOH Screenings   Food Insecurity: No Food Insecurity (06/08/2022)  Housing: Low Risk  (06/08/2022)  Transportation Needs: No Transportation Needs (06/08/2022)  Utilities: Not At Risk (06/08/2022)    Readmission Risk Interventions     No data to display          W. , RN, BSN  Trauma/Neuro ICU Case Manager [phone]  
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
## 2022-06-14 NOTE — TOC CM/SW Note (Signed)
06-14-2022   S/W  VANESSA @ [employer]: CONTACT FOR D/C PLANNING NEEDS. PHONE # [phone] X [phone]

## 2022-06-15 LAB — CBC
HCT: 26.5 % — ABNORMAL LOW (ref 36.0–46.0)
Hemoglobin: 8.3 g/dL — ABNORMAL LOW (ref 12.0–15.0)
MCH: 29.4 pg (ref 26.0–34.0)
MCHC: 31.3 g/dL (ref 30.0–36.0)
MCV: 94 fL (ref 80.0–100.0)
Platelets: 1394 10*3/uL (ref 150–400)
RBC: 2.82 MIL/uL — ABNORMAL LOW (ref 3.87–5.11)
RDW: 17.8 % — ABNORMAL HIGH (ref 11.5–15.5)
WBC: 14.7 10*3/uL — ABNORMAL HIGH (ref 4.0–10.5)
nRBC: 0 % (ref 0.0–0.2)

## 2022-06-15 LAB — GLUCOSE, CAPILLARY
Glucose-Capillary: 92 mg/dL (ref 70–99)
Glucose-Capillary: 97 mg/dL (ref 70–99)
Glucose-Capillary: 114 mg/dL — ABNORMAL HIGH (ref 70–99)
Glucose-Capillary: 108 mg/dL — ABNORMAL HIGH (ref 70–99)
Glucose-Capillary: 92 mg/dL (ref 70–99)

## 2022-06-15 LAB — BASIC METABOLIC PANEL
Anion gap: 8 (ref 5–15)
BUN: 24 mg/dL — ABNORMAL HIGH (ref 6–20)
CO2: 22 mmol/L (ref 22–32)
Calcium: 9 mg/dL (ref 8.9–10.3)
Chloride: 107 mmol/L (ref 98–111)
Creatinine, Ser: 0.38 mg/dL — ABNORMAL LOW (ref 0.44–1.00)
GFR, Estimated: >60 mL/min (ref >60)
Glucose, Bld: 97 mg/dL (ref 70–99)
Potassium: 4.6 mmol/L (ref 3.5–5.1)
Sodium: 137 mmol/L (ref 135–145)

## 2022-06-15 MED ORDER — ORAL CARE MOUTH RINSE
15.0000 mL | OROMUCOSAL | Status: DC
  Administered 2022-06-15 – 2022-06-22 (×22): 15 mL via OROMUCOSAL

## 2022-06-15 MED ORDER — LOPERAMIDE HCL 2 MG PO CAPS: 2.0000 mg | ORAL_CAPSULE | Freq: Two times a day (BID) | ORAL | Status: DC | PRN

## 2022-06-15 MED ORDER — QUETIAPINE FUMARATE 50 MG PO TABS
50.0000 mg | ORAL_TABLET | Freq: Every day | ORAL | Status: DC
  Administered 2022-06-15: 50 mg via ORAL
  Filled 2022-06-15: qty 1

## 2022-06-15 MED ORDER — ORAL CARE MOUTH RINSE: 15.0000 mL | OROMUCOSAL | Status: DC | PRN
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
## 2022-06-15 NOTE — Progress Notes (Signed)
Inpatient Rehabilitation Admissions Coordinator   I met at bedside with patient  and her parents. Patient has not been able to sit up in chair yet while on 4NP due to pain. Sat up once in 4 N. Dad is having ramp built and making modification to home bathroom next week. Patient not yet at a level to tolerate the intensity required for a CIR admit . We will follow up next week.  Danne Baxter, RN, MSN Rehab Admissions Coordinator [phone] 06/15/2022 11:50 AM

## 2022-06-15 NOTE — Progress Notes (Signed)
Pt requested that TF be held for the night due to multiple liquid BM, Liquid BM x3 in 2 hours. Pt stated "it just keeps leaking out"
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
## 2022-06-16 LAB — GLUCOSE, CAPILLARY
Glucose-Capillary: 93 mg/dL (ref 70–99)
Glucose-Capillary: 95 mg/dL (ref 70–99)
Glucose-Capillary: 96 mg/dL (ref 70–99)
Glucose-Capillary: 96 mg/dL (ref 70–99)

## 2022-06-16 MED ORDER — HYDROMORPHONE HCL 1 MG/ML IJ SOLN
1.0000 mg | Freq: Three times a day (TID) | INTRAMUSCULAR | Status: DC | PRN
  Administered 2022-06-16 – 2022-06-17 (×2): 2 mg via INTRAVENOUS
  Filled 2022-06-16 (×2): qty 2
  Filled 2022-06-16: qty 1

## 2022-06-16 NOTE — Progress Notes (Signed)
Mobility Specialist Progress Note   06/16/22 1728  Mobility  Activity Turned to back - supine  Level of Assistance Minimal assist, patient does 75% or more  Assistive Device Other (Comment) (HHA)  Range of Motion/Exercises All extremities  RUE Weight Bearing Weight bear through elbow only  LUE Weight Bearing NWB  RLE Weight Bearing NWB  LLE Weight Bearing WBAT  Activity Response Tolerated well  Mobility Referral Yes  $Mobility charge 1 Mobility   Pre Mobility: 97 HR, 99% SpO2 on RA During Mobility: 102 HR, 97% SpO2 on RA Post Mobility: 103 HR, 96% SpO2 on RA  Received in bed in general pain w/ no specify location but agreeable. Performed PROM within the precaution/restrictions for upper and lower extremities. Pt showing small signs of discomfort throughout in the expression of winces but able to tolerate and finish session. Left w/ call bell in reach and no complaints.   Frederico Hamman Mobility Specialist Please contact via SecureChat or  Rehab office at [phone]
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
## 2022-06-17 LAB — CBC
HCT: 29.3 % — ABNORMAL LOW (ref 36.0–46.0)
Hemoglobin: 9.5 g/dL — ABNORMAL LOW (ref 12.0–15.0)
MCH: 30.4 pg (ref 26.0–34.0)
MCHC: 32.4 g/dL (ref 30.0–36.0)
MCV: 93.9 fL (ref 80.0–100.0)
Platelets: 1419 10*3/uL (ref 150–400)
RBC: 3.12 MIL/uL — ABNORMAL LOW (ref 3.87–5.11)
RDW: 17.8 % — ABNORMAL HIGH (ref 11.5–15.5)
WBC: 12.9 10*3/uL — ABNORMAL HIGH (ref 4.0–10.5)
nRBC: 0 % (ref 0.0–0.2)

## 2022-06-17 LAB — BASIC METABOLIC PANEL
Anion gap: 14 (ref 5–15)
BUN: 22 mg/dL — ABNORMAL HIGH (ref 6–20)
CO2: 20 mmol/L — ABNORMAL LOW (ref 22–32)
Calcium: 9.3 mg/dL (ref 8.9–10.3)
Chloride: 102 mmol/L (ref 98–111)
Creatinine, Ser: 0.63 mg/dL (ref 0.44–1.00)
GFR, Estimated: >60 mL/min (ref >60)
Glucose, Bld: 98 mg/dL (ref 70–99)
Potassium: 4 mmol/L (ref 3.5–5.1)
Sodium: 136 mmol/L (ref 135–145)

## 2022-06-17 MED ORDER — GABAPENTIN 600 MG PO TABS
300.0000 mg | ORAL_TABLET | Freq: Three times a day (TID) | ORAL | Status: DC
  Administered 2022-06-17 (×2): 300 mg via ORAL
  Filled 2022-06-17 (×5): qty 0.5

## 2022-06-17 MED ORDER — MELATONIN 3 MG PO TABS
3.0000 mg | ORAL_TABLET | Freq: Every day | ORAL | Status: DC
  Administered 2022-06-17 – 2022-06-21 (×5): 3 mg via ORAL
  Filled 2022-06-17 (×5): qty 1

## 2022-06-17 MED ORDER — HYDROMORPHONE HCL 1 MG/ML IJ SOLN
1.0000 mg | Freq: Four times a day (QID) | INTRAMUSCULAR | Status: DC | PRN
  Administered 2022-06-17 (×2): 1 mg via INTRAVENOUS
  Filled 2022-06-17 (×2): qty 1

## 2022-06-17 MED ORDER — POLYETHYLENE GLYCOL 3350 17 G PO PACK: 17.0000 g | PACK | Freq: Every day | ORAL | Status: DC | PRN

## 2022-06-17 MED ORDER — DIPHENHYDRAMINE HCL 50 MG/ML IJ SOLN
INTRAMUSCULAR | Status: AC
  Filled 2022-06-17: qty 1

## 2022-06-17 MED ORDER — DOCUSATE SODIUM 100 MG PO CAPS
100.0000 mg | ORAL_CAPSULE | Freq: Two times a day (BID) | ORAL | Status: DC
  Administered 2022-06-17 – 2022-06-20 (×7): 100 mg via ORAL
  Filled 2022-06-17 (×6): qty 1

## 2022-06-17 MED ORDER — ESCITALOPRAM OXALATE 10 MG PO TABS
20.0000 mg | ORAL_TABLET | Freq: Every day | ORAL | Status: DC
  Administered 2022-06-17 – 2022-06-22 (×6): 20 mg via ORAL
  Filled 2022-06-17 (×6): qty 2
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
## 2022-06-18 MED ORDER — HYDROMORPHONE HCL 1 MG/ML IJ SOLN
0.5000 mg | INTRAMUSCULAR | Status: DC | PRN
  Administered 2022-06-18 – 2022-06-19 (×4): 1 mg via INTRAVENOUS
  Filled 2022-06-18 (×4): qty 1

## 2022-06-18 MED ORDER — GABAPENTIN 300 MG PO CAPS
300.0000 mg | ORAL_CAPSULE | Freq: Three times a day (TID) | ORAL | Status: DC
  Administered 2022-06-18 – 2022-06-19 (×4): 300 mg via ORAL
  Filled 2022-06-18 (×4): qty 1

## 2022-06-18 NOTE — Progress Notes (Signed)
Orthopedic Tech Progress Note Patient Details:  Alyssa Lopez [birth year] [id number] Applied Prafo to LLE. Left right PRAFO in room Ortho Devices Type of Ortho Device: Prafo boot/shoe Ortho Device/Splint Location: LLE Ortho Device/Splint Interventions: Ordered, Application   Post Interventions Patient Tolerated: Well Instructions Provided: Adjustment of device   A  06/18/2022, 7:40 PM
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
## 2022-06-19 MED ORDER — GABAPENTIN 300 MG PO CAPS
600.0000 mg | ORAL_CAPSULE | Freq: Three times a day (TID) | ORAL | Status: DC
  Administered 2022-06-19 – 2022-06-22 (×9): 600 mg via ORAL
  Filled 2022-06-19 (×9): qty 2

## 2022-06-19 MED ORDER — HYDROMORPHONE HCL 1 MG/ML IJ SOLN
0.5000 mg | INTRAMUSCULAR | Status: DC | PRN
  Administered 2022-06-19 – 2022-06-20 (×2): 0.5 mg via INTRAVENOUS
  Filled 2022-06-19 (×2): qty 0.5

## 2022-06-19 MED ORDER — IBUPROFEN 200 MG PO TABS
800.0000 mg | ORAL_TABLET | Freq: Four times a day (QID) | ORAL | Status: DC
  Administered 2022-06-19 – 2022-06-22 (×13): 800 mg via ORAL
  Filled 2022-06-19 (×13): qty 4
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
## 2022-06-19 NOTE — Progress Notes (Signed)
Inpatient Rehab Admissions Coordinator:    I met with Pt. And mother at bedside to discuss CIR admit. Would like to see Pt. Attempting transfers and getting up to the chair for at least an hour  and managing pain with mostly PO meds. Will continue to follow and open case with insurance once appropriate.   Clemens Catholic, [place], [place] Admissions Coordinator  [phone] (Shelly) [phone] (office)
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
## 2022-06-20 MED ORDER — HYDROMORPHONE HCL 1 MG/ML IJ SOLN
0.5000 mg | Freq: Four times a day (QID) | INTRAMUSCULAR | Status: DC | PRN
  Administered 2022-06-20 – 2022-06-21 (×3): 0.5 mg via INTRAVENOUS
  Filled 2022-06-20 (×3): qty 0.5

## 2022-06-20 MED ORDER — POLYETHYLENE GLYCOL 3350 17 G PO PACK
17.0000 g | PACK | Freq: Every day | ORAL | Status: DC
  Administered 2022-06-20: 17 g via ORAL
  Filled 2022-06-20 (×3): qty 1
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
## 2022-06-21 ENCOUNTER — Inpatient Hospital Stay (HOSPITAL_COMMUNITY): Payer: BC Managed Care – PPO

## 2022-06-21 ENCOUNTER — Encounter (HOSPITAL_COMMUNITY): Payer: Self-pay

## 2022-06-21 MED ORDER — METHOCARBAMOL 500 MG PO TABS
1000.0000 mg | ORAL_TABLET | Freq: Four times a day (QID) | ORAL | Status: DC
  Administered 2022-06-21 – 2022-06-22 (×5): 1000 mg via ORAL
  Filled 2022-06-21 (×5): qty 2
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
## 2022-06-21 NOTE — Progress Notes (Signed)
IP rehab admissions - I have received authorization from [facility] for acute inpatient rehab admission.  Noted patient with increased pain and MRI ordered.  In trauma rounds, report was that patient likely ready for CIR tomorrow.  I will have my partner follow up in am for medical readiness.  Call for questions.  [phone]
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
## 2022-06-22 ENCOUNTER — Inpatient Hospital Stay (HOSPITAL_COMMUNITY)
Admission: RE | Admit: 2022-06-22 | Discharge: 2022-07-12 | DRG: 560 | Disposition: A | Discharge status: Home / Self Care | Payer: BC Managed Care – PPO | Source: Intra-hospital | Attending: Physical Medicine & Rehabilitation | Admitting: Physical Medicine & Rehabilitation

## 2022-06-22 ENCOUNTER — Encounter (HOSPITAL_COMMUNITY): Payer: Self-pay | Admitting: Physical Medicine & Rehabilitation

## 2022-06-22 ENCOUNTER — Other Ambulatory Visit: Payer: Self-pay

## 2022-06-22 ENCOUNTER — Encounter (HOSPITAL_BASED_OUTPATIENT_CLINIC_OR_DEPARTMENT_OTHER): Payer: Self-pay | Admitting: Radiology

## 2022-06-22 DIAGNOSIS — R7401 Elevation of levels of liver transaminase levels: Secondary | ICD-10-CM | POA: Diagnosis present

## 2022-06-22 DIAGNOSIS — Z79899 Other long term (current) drug therapy: Secondary | ICD-10-CM | POA: Diagnosis not present

## 2022-06-22 DIAGNOSIS — T07XXXA Unspecified multiple injuries, initial encounter: Principal | ICD-10-CM | POA: Diagnosis present

## 2022-06-22 DIAGNOSIS — M79672 Pain in left foot: Secondary | ICD-10-CM | POA: Diagnosis not present

## 2022-06-22 DIAGNOSIS — K5900 Constipation, unspecified: Secondary | ICD-10-CM | POA: Diagnosis present

## 2022-06-22 DIAGNOSIS — M25562 Pain in left knee: Secondary | ICD-10-CM | POA: Diagnosis present

## 2022-06-22 DIAGNOSIS — K5901 Slow transit constipation: Secondary | ICD-10-CM | POA: Diagnosis not present

## 2022-06-22 DIAGNOSIS — S52611D Displaced fracture of right ulna styloid process, subsequent encounter for closed fracture with routine healing: Secondary | ICD-10-CM | POA: Diagnosis not present

## 2022-06-22 DIAGNOSIS — S32049D Unspecified fracture of fourth lumbar vertebra, subsequent encounter for fracture with routine healing: Secondary | ICD-10-CM | POA: Diagnosis not present

## 2022-06-22 DIAGNOSIS — R5381 Other malaise: Secondary | ICD-10-CM | POA: Diagnosis present

## 2022-06-22 DIAGNOSIS — F4010 Social phobia, unspecified: Secondary | ICD-10-CM | POA: Diagnosis present

## 2022-06-22 DIAGNOSIS — R3911 Hesitancy of micturition: Secondary | ICD-10-CM | POA: Diagnosis not present

## 2022-06-22 DIAGNOSIS — S7400XS Injury of sciatic nerve at hip and thigh level, unspecified leg, sequela: Secondary | ICD-10-CM | POA: Diagnosis not present

## 2022-06-22 DIAGNOSIS — R400 Somnolence: Secondary | ICD-10-CM | POA: Diagnosis present

## 2022-06-22 DIAGNOSIS — F4322 Adjustment disorder with anxiety: Secondary | ICD-10-CM | POA: Diagnosis not present

## 2022-06-22 DIAGNOSIS — D75839 Thrombocytosis, unspecified: Secondary | ICD-10-CM | POA: Diagnosis present

## 2022-06-22 DIAGNOSIS — S92301D Fracture of unspecified metatarsal bone(s), right foot, subsequent encounter for fracture with routine healing: Secondary | ICD-10-CM | POA: Diagnosis not present

## 2022-06-22 DIAGNOSIS — S82001F Unspecified fracture of right patella, subsequent encounter for open fracture type IIIA, IIIB, or IIIC with routine healing: Secondary | ICD-10-CM | POA: Diagnosis not present

## 2022-06-22 DIAGNOSIS — S32019D Unspecified fracture of first lumbar vertebra, subsequent encounter for fracture with routine healing: Secondary | ICD-10-CM

## 2022-06-22 DIAGNOSIS — Z793 Long term (current) use of hormonal contraceptives: Secondary | ICD-10-CM | POA: Diagnosis not present

## 2022-06-22 DIAGNOSIS — Z975 Presence of (intrauterine) contraceptive device: Secondary | ICD-10-CM

## 2022-06-22 DIAGNOSIS — R000 Tachycardia, unspecified: Secondary | ICD-10-CM | POA: Diagnosis present

## 2022-06-22 DIAGNOSIS — S52022D Displaced fracture of olecranon process without intraarticular extension of left ulna, subsequent encounter for closed fracture with routine healing: Secondary | ICD-10-CM | POA: Diagnosis not present

## 2022-06-22 DIAGNOSIS — S32029D Unspecified fracture of second lumbar vertebra, subsequent encounter for fracture with routine healing: Secondary | ICD-10-CM | POA: Diagnosis not present

## 2022-06-22 DIAGNOSIS — S72352F Displaced comminuted fracture of shaft of left femur, subsequent encounter for open fracture type IIIA, IIIB, or IIIC with routine healing: Secondary | ICD-10-CM | POA: Diagnosis not present

## 2022-06-22 DIAGNOSIS — M545 Low back pain, unspecified: Secondary | ICD-10-CM | POA: Diagnosis not present

## 2022-06-22 DIAGNOSIS — G4700 Insomnia, unspecified: Secondary | ICD-10-CM | POA: Diagnosis present

## 2022-06-22 DIAGNOSIS — S32059D Unspecified fracture of fifth lumbar vertebra, subsequent encounter for fracture with routine healing: Secondary | ICD-10-CM | POA: Diagnosis not present

## 2022-06-22 DIAGNOSIS — S92341D Displaced fracture of fourth metatarsal bone, right foot, subsequent encounter for fracture with routine healing: Secondary | ICD-10-CM

## 2022-06-22 DIAGNOSIS — M79671 Pain in right foot: Secondary | ICD-10-CM | POA: Diagnosis not present

## 2022-06-22 DIAGNOSIS — R3916 Straining to void: Secondary | ICD-10-CM | POA: Diagnosis not present

## 2022-06-22 DIAGNOSIS — R600 Localized edema: Secondary | ICD-10-CM | POA: Diagnosis present

## 2022-06-22 DIAGNOSIS — D62 Acute posthemorrhagic anemia: Secondary | ICD-10-CM | POA: Diagnosis present

## 2022-06-22 DIAGNOSIS — Y92410 Unspecified street and highway as the place of occurrence of the external cause: Secondary | ICD-10-CM | POA: Diagnosis not present

## 2022-06-22 DIAGNOSIS — S7401XD Injury of sciatic nerve at hip and thigh level, right leg, subsequent encounter: Secondary | ICD-10-CM

## 2022-06-22 DIAGNOSIS — R52 Pain, unspecified: Secondary | ICD-10-CM | POA: Diagnosis not present

## 2022-06-22 DIAGNOSIS — S3210XD Unspecified fracture of sacrum, subsequent encounter for fracture with routine healing: Secondary | ICD-10-CM

## 2022-06-22 DIAGNOSIS — S32039D Unspecified fracture of third lumbar vertebra, subsequent encounter for fracture with routine healing: Secondary | ICD-10-CM | POA: Diagnosis not present

## 2022-06-22 DIAGNOSIS — S7292XF Unspecified fracture of left femur, subsequent encounter for open fracture type IIIA, IIIB, or IIIC with routine healing: Secondary | ICD-10-CM | POA: Diagnosis present

## 2022-06-22 DIAGNOSIS — F411 Generalized anxiety disorder: Secondary | ICD-10-CM

## 2022-06-22 DIAGNOSIS — Z741 Need for assistance with personal care: Secondary | ICD-10-CM | POA: Diagnosis present

## 2022-06-22 DIAGNOSIS — S52501D Unspecified fracture of the lower end of right radius, subsequent encounter for closed fracture with routine healing: Secondary | ICD-10-CM

## 2022-06-22 DIAGNOSIS — S7402XD Injury of sciatic nerve at hip and thigh level, left leg, subsequent encounter: Secondary | ICD-10-CM

## 2022-06-22 DIAGNOSIS — S92311D Displaced fracture of first metatarsal bone, right foot, subsequent encounter for fracture with routine healing: Secondary | ICD-10-CM

## 2022-06-22 DIAGNOSIS — S92321D Displaced fracture of second metatarsal bone, right foot, subsequent encounter for fracture with routine healing: Secondary | ICD-10-CM

## 2022-06-22 MED ORDER — IBUPROFEN 400 MG PO TABS
800.0000 mg | ORAL_TABLET | Freq: Four times a day (QID) | ORAL | Status: DC
  Administered 2022-06-22 – 2022-06-30 (×30): 800 mg via ORAL
  Filled 2022-06-22: qty 2
  Filled 2022-06-22: qty 1
  Filled 2022-06-22: qty 2
  Filled 2022-06-22 (×6): qty 1
  Filled 2022-06-22: qty 2
  Filled 2022-06-22 (×2): qty 1
  Filled 2022-06-22: qty 2
  Filled 2022-06-22 (×2): qty 1
  Filled 2022-06-22: qty 2
  Filled 2022-06-22 (×3): qty 1
  Filled 2022-06-22: qty 2
  Filled 2022-06-22 (×2): qty 1
  Filled 2022-06-22: qty 2
  Filled 2022-06-22 (×2): qty 1
  Filled 2022-06-22: qty 2
  Filled 2022-06-22: qty 1
  Filled 2022-06-22: qty 2
  Filled 2022-06-22 (×3): qty 1
  Filled 2022-06-22: qty 2
  Filled 2022-06-22 (×2): qty 1

## 2022-06-22 MED ORDER — TRAMADOL HCL 50 MG PO TABS
50.0000 mg | ORAL_TABLET | Freq: Four times a day (QID) | ORAL | Status: DC
  Administered 2022-06-22 – 2022-07-12 (×79): 50 mg via ORAL
  Filled 2022-06-22 (×80): qty 1

## 2022-06-22 MED ORDER — LORAZEPAM 2 MG/ML IJ SOLN: 0.5000 mg | Freq: Three times a day (TID) | INTRAMUSCULAR | Status: DC | PRN

## 2022-06-22 MED ORDER — DIPHENHYDRAMINE HCL 12.5 MG/5ML PO ELIX: 12.5000 mg | ORAL_SOLUTION | Freq: Four times a day (QID) | ORAL | Status: DC | PRN

## 2022-06-22 MED ORDER — ADULT MULTIVITAMIN W/MINERALS CH
1.0000 | ORAL_TABLET | Freq: Every day | ORAL | Status: DC
  Administered 2022-06-23 – 2022-07-12 (×20): 1 via ORAL
  Filled 2022-06-22 (×20): qty 1

## 2022-06-22 MED ORDER — GABAPENTIN 300 MG PO CAPS
600.0000 mg | ORAL_CAPSULE | Freq: Three times a day (TID) | ORAL | Status: DC
  Administered 2022-06-22 – 2022-06-26 (×12): 600 mg via ORAL
  Filled 2022-06-22 (×13): qty 2

## 2022-06-22 MED ORDER — ACETAMINOPHEN 500 MG PO TABS
1000.0000 mg | ORAL_TABLET | Freq: Four times a day (QID) | ORAL | Status: DC
  Administered 2022-06-22 – 2022-07-12 (×68): 1000 mg via ORAL
  Filled 2022-06-22 (×80): qty 2

## 2022-06-22 MED ORDER — ENOXAPARIN SODIUM 30 MG/0.3ML IJ SOSY
30.0000 mg | PREFILLED_SYRINGE | Freq: Two times a day (BID) | INTRAMUSCULAR | Status: DC
  Administered 2022-06-22 – 2022-07-12 (×39): 30 mg via SUBCUTANEOUS
  Filled 2022-06-22 (×39): qty 0.3

## 2022-06-22 MED ORDER — LIDOCAINE 5 % EX PTCH
1.0000 | MEDICATED_PATCH | CUTANEOUS | Status: DC
  Administered 2022-06-23 – 2022-07-01 (×9): 1 via TRANSDERMAL
  Filled 2022-06-22 (×9): qty 1

## 2022-06-22 MED ORDER — MAGNESIUM HYDROXIDE 400 MG/5ML PO SUSP
30.0000 mL | Freq: Once | ORAL | Status: AC
  Administered 2022-06-22: 30 mL via ORAL
  Filled 2022-06-22: qty 30

## 2022-06-22 MED ORDER — ENSURE ENLIVE PO LIQD
237.0000 mL | Freq: Three times a day (TID) | ORAL | Status: DC
  Administered 2022-06-24 – 2022-06-26 (×4): 237 mL via ORAL

## 2022-06-22 MED ORDER — LOPERAMIDE HCL 2 MG PO CAPS: 2.0000 mg | ORAL_CAPSULE | Freq: Two times a day (BID) | ORAL | Status: DC | PRN

## 2022-06-22 MED ORDER — ENOXAPARIN SODIUM 30 MG/0.3ML IJ SOSY: 30.0000 mg | PREFILLED_SYRINGE | Freq: Two times a day (BID) | INTRAMUSCULAR | Status: DC

## 2022-06-22 MED ORDER — PROCHLORPERAZINE MALEATE 5 MG PO TABS: 5.0000 mg | ORAL_TABLET | Freq: Four times a day (QID) | ORAL | Status: DC | PRN

## 2022-06-22 MED ORDER — PROCHLORPERAZINE EDISYLATE 10 MG/2ML IJ SOLN: 5.0000 mg | Freq: Four times a day (QID) | INTRAMUSCULAR | Status: DC | PRN

## 2022-06-22 MED ORDER — SENNA 8.6 MG PO TABS
2.0000 | ORAL_TABLET | Freq: Once | ORAL | Status: AC
  Administered 2022-06-22: 17.2 mg via ORAL
  Filled 2022-06-22: qty 2

## 2022-06-22 MED ORDER — GUAIFENESIN-DM 100-10 MG/5ML PO SYRP: 5.0000 mL | ORAL_SOLUTION | Freq: Four times a day (QID) | ORAL | Status: DC | PRN

## 2022-06-22 MED ORDER — BISACODYL 5 MG PO TBEC
10.0000 mg | DELAYED_RELEASE_TABLET | Freq: Once | ORAL | Status: AC
  Administered 2022-06-22: 10 mg via ORAL
  Filled 2022-06-22: qty 2

## 2022-06-22 MED ORDER — MAGNESIUM CITRATE PO SOLN
1.0000 | Freq: Once | ORAL | Status: DC
  Filled 2022-06-22: qty 296

## 2022-06-22 MED ORDER — PROCHLORPERAZINE 25 MG RE SUPP: 12.5000 mg | Freq: Four times a day (QID) | RECTAL | Status: DC | PRN

## 2022-06-22 MED ORDER — SENNOSIDES-DOCUSATE SODIUM 8.6-50 MG PO TABS
2.0000 | ORAL_TABLET | Freq: Every day | ORAL | Status: DC
  Administered 2022-06-22 – 2022-07-11 (×20): 2 via ORAL
  Filled 2022-06-22 (×21): qty 2

## 2022-06-22 MED ORDER — ESCITALOPRAM OXALATE 10 MG PO TABS
20.0000 mg | ORAL_TABLET | Freq: Every day | ORAL | Status: DC
  Administered 2022-06-23 – 2022-07-12 (×20): 20 mg via ORAL
  Filled 2022-06-22 (×20): qty 2

## 2022-06-22 MED ORDER — METOPROLOL TARTRATE 12.5 MG HALF TABLET
12.5000 mg | ORAL_TABLET | Freq: Two times a day (BID) | ORAL | Status: DC
  Administered 2022-06-22 – 2022-07-10 (×36): 12.5 mg via ORAL
  Filled 2022-06-22 (×36): qty 1

## 2022-06-22 MED ORDER — METHOCARBAMOL 500 MG PO TABS
1000.0000 mg | ORAL_TABLET | Freq: Four times a day (QID) | ORAL | Status: DC
  Administered 2022-06-22 – 2022-07-12 (×79): 1000 mg via ORAL
  Filled 2022-06-22 (×84): qty 2

## 2022-06-22 MED ORDER — SORBITOL 70 % SOLN
30.0000 mL | Freq: Every day | Status: DC | PRN
  Administered 2022-06-23 – 2022-06-24 (×2): 30 mL via ORAL
  Filled 2022-06-22 (×2): qty 30

## 2022-06-22 MED ORDER — MAGNESIUM HYDROXIDE 400 MG/5ML PO SUSP: 30.0000 mL | Freq: Every day | ORAL | Status: DC | PRN

## 2022-06-22 MED ORDER — MELATONIN 3 MG PO TABS
3.0000 mg | ORAL_TABLET | Freq: Every day | ORAL | Status: DC
  Administered 2022-06-22 – 2022-07-11 (×19): 3 mg via ORAL
  Filled 2022-06-22 (×20): qty 1

## 2022-06-22 MED ORDER — BISACODYL 10 MG RE SUPP: 10.0000 mg | Freq: Once | RECTAL | Status: DC

## 2022-06-22 MED ORDER — IPRATROPIUM-ALBUTEROL 0.5-2.5 (3) MG/3ML IN SOLN: 3.0000 mL | RESPIRATORY_TRACT | Status: DC | PRN

## 2022-06-22 MED ORDER — ALUM & MAG HYDROXIDE-SIMETH 200-200-20 MG/5ML PO SUSP: 30.0000 mL | ORAL | Status: DC | PRN

## 2022-06-22 MED ORDER — TRAZODONE HCL 50 MG PO TABS
25.0000 mg | ORAL_TABLET | Freq: Every evening | ORAL | Status: DC | PRN
  Administered 2022-06-23 – 2022-07-11 (×12): 50 mg via ORAL
  Filled 2022-06-22 (×12): qty 1

## 2022-06-22 MED ORDER — OXYCODONE HCL 5 MG PO TABS
10.0000 mg | ORAL_TABLET | ORAL | Status: DC | PRN
  Administered 2022-06-22 (×2): 10 mg via ORAL
  Administered 2022-06-23 (×4): 15 mg via ORAL
  Administered 2022-06-23 – 2022-06-24 (×2): 10 mg via ORAL
  Administered 2022-06-24: 15 mg via ORAL
  Administered 2022-06-24 (×3): 10 mg via ORAL
  Administered 2022-06-25: 15 mg via ORAL
  Administered 2022-06-25: 10 mg via ORAL
  Administered 2022-06-25 – 2022-06-26 (×4): 15 mg via ORAL
  Administered 2022-06-26 – 2022-06-27 (×4): 10 mg via ORAL
  Administered 2022-06-27 – 2022-06-28 (×2): 15 mg via ORAL
  Filled 2022-06-22: qty 2
  Filled 2022-06-22 (×2): qty 3
  Filled 2022-06-22: qty 2
  Filled 2022-06-22 (×3): qty 3
  Filled 2022-06-22: qty 2
  Filled 2022-06-22: qty 3
  Filled 2022-06-22 (×3): qty 2
  Filled 2022-06-22: qty 3
  Filled 2022-06-22 (×2): qty 2
  Filled 2022-06-22: qty 3
  Filled 2022-06-22: qty 2
  Filled 2022-06-22 (×4): qty 3
  Filled 2022-06-22: qty 2
  Filled 2022-06-22: qty 3
  Filled 2022-06-22: qty 2

## 2022-06-22 MED ORDER — ERGOCALCIFEROL 200 MCG/ML PO SOLN
50000.0000 [IU] | ORAL | Status: DC
  Filled 2022-06-22: qty 6.25

## 2022-06-22 MED ORDER — FLEET ENEMA 7-19 GM/118ML RE ENEM
1.0000 | ENEMA | Freq: Once | RECTAL | Status: AC | PRN
  Administered 2022-06-25: 1 via RECTAL
  Filled 2022-06-22: qty 1

## 2022-06-22 NOTE — Progress Notes (Signed)
Inpatient Rehab Admissions Coordinator:    I have a CIR bed for this pt. Today. RN may call report to [phone].   , MS, CCC-SLP Rehab Admissions Coordinator  [phone] (celll) [phone] (office)  

## 2022-06-22 NOTE — Progress Notes (Signed)
Patient ID: Alyssa Lopez, female   DOB: Mar 03, 2001, 22 y.o.   MRN: [id number]  INPATIENT REHABILITATION ADMISSION NOTE   Arrival Method: bed     Mental Orientation: x4   Assessment: see flowsheet   Skin: see flowsheet   IV'S: right arm   Pain: see flowsheet   Tubes and Drains: n/a   Safety Measures: in place   Vital Signs: see flowsheet   Height and Weight: see flowsheet   Rehab Orientation: completed   Family: at bedside

## 2022-06-22 NOTE — Discharge Instructions (Addendum)
Continue to wear soft collar until follow up with neurosurgery. Okay to remove collar occasionally.  [facility], [place] [phone]  OPEN ABDOMINAL SURGERY: POST OP INSTRUCTIONS  Always review your discharge instruction sheet given to you by the facility where your surgery was performed.  IF YOU HAVE DISABILITY OR FAMILY LEAVE FORMS, YOU MUST BRING THEM TO THE OFFICE FOR PROCESSING.  PLEASE DO NOT GIVE THEM TO YOUR DOCTOR.  A prescription for pain medication may be given to you upon discharge.  Take your pain medication as prescribed, if needed.  If narcotic pain medicine is not needed, then you may take acetaminophen (Tylenol) or ibuprofen (Advil) as needed. Take your usually prescribed medications unless otherwise directed. If you need a refill on your pain medication, please contact your pharmacy. They will contact our office to request authorization.  Prescriptions will not be filled after 5pm or on week-ends. You should follow a light diet the first few days after arrival home, such as soup and crackers, pudding, etc.unless your doctor has advised otherwise. A high-fiber, low fat diet can be resumed as tolerated.   Be sure to include lots of fluids daily. Most patients will experience some swelling and bruising on the chest and neck area.  Ice packs will help.  Swelling and bruising can take several days to resolve Most patients will experience some swelling and bruising in the area of the incision. Ice pack will help. Swelling and bruising can take several days to resolve..  It is common to experience some constipation if taking pain medication after surgery.  Increasing fluid intake and taking a stool softener will usually help or prevent this problem from occurring.  A mild laxative (Milk of Magnesia or Miralax) should be taken according to package directions if there are no bowel movements after 48 hours.  You may have steri-strips (small skin tapes) in place directly  over the incision.  These strips should be left on the skin for 7-10 days.  If your surgeon used skin glue on the incision, you may shower in 24 hours.  The glue will flake off over the next 2-3 weeks.  Any sutures or staples will be removed at the office during your follow-up visit. You may find that a light gauze bandage over your incision may keep your staples from being rubbed or pulled. You may shower and replace the bandage daily. ACTIVITIES:  You may resume regular (light) daily activities beginning the next day--such as daily self-care, walking, climbing stairs--gradually increasing activities as tolerated.  You may have sexual intercourse when it is comfortable.  Refrain from any heavy lifting or straining until approved by your doctor. You may drive when you no longer are taking prescription pain medication, you can comfortably wear a seatbelt, and you can safely maneuver your car and apply brakes Return to Work: ___________________________________ Dennis Bast should see your doctor in the office for a follow-up appointment approximately two weeks after your surgery.  Make sure that you call for this appointment within a day or two after you arrive home to insure a convenient appointment time. OTHER INSTRUCTIONS:  _____________________________________________________________ _____________________________________________________________  WHEN TO CALL YOUR DOCTOR: Fever over 101.0 Inability to urinate Nausea and/or vomiting Extreme swelling or bruising Continued bleeding from incision. Increased pain, redness, or drainage from the incision. Difficulty swallowing or breathing Muscle cramping or spasms. Numbness or tingling in hands or feet or around lips.  The clinic staff is available to answer your questions during  regular business hours.  Please don't hesitate to call and ask to speak to one of the nurses if you have concerns.  For further questions, please visit  [email or web address]
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
## 2022-06-23 DIAGNOSIS — T07XXXA Unspecified multiple injuries, initial encounter: Secondary | ICD-10-CM | POA: Diagnosis not present

## 2022-06-23 LAB — COMPREHENSIVE METABOLIC PANEL
ALT: 62 U/L — ABNORMAL HIGH (ref 0–44)
AST: 64 U/L — ABNORMAL HIGH (ref 15–41)
Albumin: 2.9 g/dL — ABNORMAL LOW (ref 3.5–5.0)
Alkaline Phosphatase: 410 U/L — ABNORMAL HIGH (ref 38–126)
Anion gap: 13 (ref 5–15)
BUN: 12 mg/dL (ref 6–20)
CO2: 19 mmol/L — ABNORMAL LOW (ref 22–32)
Calcium: 9.1 mg/dL (ref 8.9–10.3)
Chloride: 105 mmol/L (ref 98–111)
Creatinine, Ser: 0.63 mg/dL (ref 0.44–1.00)
GFR, Estimated: >60 mL/min (ref >60)
Glucose, Bld: 97 mg/dL (ref 70–99)
Potassium: 5 mmol/L (ref 3.5–5.1)
Sodium: 137 mmol/L (ref 135–145)
Total Bilirubin: 1.2 mg/dL (ref 0.3–1.2)
Total Protein: 5.8 g/dL — ABNORMAL LOW (ref 6.5–8.1)

## 2022-06-23 LAB — CBC WITH DIFFERENTIAL/PLATELET
Abs Immature Granulocytes: 0.03 10*3/uL (ref 0.00–0.07)
Basophils Absolute: 0.1 10*3/uL (ref 0.0–0.1)
Basophils Relative: 1 %
Eosinophils Absolute: 0.4 10*3/uL (ref 0.0–0.5)
Eosinophils Relative: 6 %
HCT: 31.6 % — ABNORMAL LOW (ref 36.0–46.0)
Hemoglobin: 9.9 g/dL — ABNORMAL LOW (ref 12.0–15.0)
Immature Granulocytes: 0 %
Lymphocytes Relative: 19 %
Lymphs Abs: 1.5 10*3/uL (ref 0.7–4.0)
MCH: 30 pg (ref 26.0–34.0)
MCHC: 31.3 g/dL (ref 30.0–36.0)
MCV: 95.8 fL (ref 80.0–100.0)
Monocytes Absolute: 0.5 10*3/uL (ref 0.1–1.0)
Monocytes Relative: 6 %
Neutro Abs: 5.3 10*3/uL (ref 1.7–7.7)
Neutrophils Relative %: 68 %
Platelets: 499 10*3/uL — ABNORMAL HIGH (ref 150–400)
RBC: 3.3 MIL/uL — ABNORMAL LOW (ref 3.87–5.11)
RDW: 16.5 % — ABNORMAL HIGH (ref 11.5–15.5)
WBC: 7.7 10*3/uL (ref 4.0–10.5)
nRBC: 0 % (ref 0.0–0.2)

## 2022-06-23 NOTE — Plan of Care (Signed)
  Problem: Consults Goal: RH GENERAL PATIENT EDUCATION Description: See Patient Education module for education specifics. Outcome: Progressing Goal: Skin Care Protocol Initiated - if Braden Score 18 or less Description: If consults are not indicated, leave blank or document N/A Outcome: Progressing   Problem: RH BOWEL ELIMINATION Goal: RH STG MANAGE BOWEL WITH ASSISTANCE Description: STG Manage Bowel with mod Assistance. Outcome: Progressing Goal: RH STG MANAGE BOWEL W/MEDICATION W/ASSISTANCE Description: STG Manage Bowel with Medication with min Assistance. Outcome: Progressing   Problem: RH SKIN INTEGRITY Goal: RH STG SKIN FREE OF INFECTION/BREAKDOWN Description: Skin/incisions will be free of any infection/breakdown with min assist Outcome: Progressing Goal: RH STG MAINTAIN SKIN INTEGRITY WITH ASSISTANCE Description: STG Maintain Skin Integrity With min Assistance. Outcome: Progressing Goal: RH STG ABLE TO PERFORM INCISION/WOUND CARE W/ASSISTANCE Description: STG Able To Perform Incision/Wound Care With mod Assistance. Outcome: Progressing   Problem: RH SAFETY Goal: RH STG ADHERE TO SAFETY PRECAUTIONS W/ASSISTANCE/DEVICE Description: STG Adhere to Safety Precautions With cueing Assistance/Device. Outcome: Progressing   Problem: RH PAIN MANAGEMENT Goal: RH STG PAIN MANAGED AT OR BELOW PT'S PAIN GOAL Description: Pain will be managed at 4 out of 10 on pain scale with PRN medications min assist Outcome: Progressing   Problem: RH KNOWLEDGE DEFICIT GENERAL Goal: RH STG INCREASE KNOWLEDGE OF SELF CARE AFTER HOSPITALIZATION Description: Patient/caregiver will be able to manage medications, wounds, and self care max/total assist @ wheelchair level from nursing education and nursing handouts independently  Outcome: Progressing   

## 2022-06-23 NOTE — Progress Notes (Addendum)
From observation earlier in the evening patient grimacing from pain and observed having episode of tearfulness from pain. Given PRN medication at 2243 in addition to scheduled medication. In addition to, repositioning of patient, elevating/lowering of right leg, and mom of patient applying heat to right leg of her daughter to assist in managing pain throughout the evening. Patient's mother in room. Mother of patient expressing concern about current pain management for her daughter and current medication regiment. Asking about medications being discontinued prior to arrival to the unit and expressing concern if her daughter would be able to complete therapy if not able to sleep due to pain. Also, concerned about not being woken up for pain medication through the night due to being PRN not a scheduled medication. Discussed about sleep medications in the [facility] ordered by the provider. Patient given the Trazadone 50 mg at 0005 with scheduled pain medication. Is observed asleep by 0130.
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
## 2022-06-23 NOTE — Progress Notes (Signed)
Inpatient Rehabilitation Admission Medication Review by a Pharmacist  A complete drug regimen review was completed for this patient to identify any potential clinically significant medication issues.  High Risk Drug Classes Is patient taking? Indication by Medication  Antipsychotic Yes Compazine-N/V  Anticoagulant Yes Lovenox-DVT ppx  Antibiotic No   Opioid Yes Oxycodone, tramadol-pain  Antiplatelet No   Hypoglycemics/insulin No   Vasoactive Medication Yes Lopressor-tachycardia  Chemotherapy No   Other Yes Lexapro-depression/anxiety Gabapentin-pain Advil, lidocaine-pain Melatonin, trazodone-sleep Robaxin-muscle spasms Senokot-S, MoM, Fleet, sorbitol: constipation Imodium-diarrhea     Type of Medication Issue Identified Description of Issue Recommendation(s)  Drug Interaction(s) (clinically significant)     Duplicate Therapy     Allergy     No Medication Administration End Date     Incorrect Dose     Additional Drug Therapy Needed     Significant med changes from prior encounter (inform family/care partners about these prior to discharge).    Other       Clinically significant medication issues were identified that warrant physician communication and completion of prescribed/recommended actions by midnight of the next day:  No  Name of provider notified for urgent issues identified:   Provider Method of Notification:    Pharmacist comments:   Time spent performing this drug regimen review (minutes):  20 minutes    Sandford Craze, PharmD. [facility] Acute Care PGY-1  06/23/2022 8:41 AM

## 2022-06-24 DIAGNOSIS — K5901 Slow transit constipation: Secondary | ICD-10-CM

## 2022-06-24 DIAGNOSIS — T07XXXA Unspecified multiple injuries, initial encounter: Secondary | ICD-10-CM | POA: Diagnosis not present

## 2022-06-24 MED ORDER — POLYETHYLENE GLYCOL 3350 17 G PO PACK
17.0000 g | PACK | Freq: Two times a day (BID) | ORAL | Status: DC
  Administered 2022-06-24 – 2022-07-06 (×20): 17 g via ORAL
  Filled 2022-06-24 (×25): qty 1

## 2022-06-24 NOTE — Progress Notes (Signed)
From 1900 to 2235 patient awake and alert throughout the evening. From 0007 to 0418 endorsed that pain was "tolerable". However, while having vitals taken and waking up unendorsed pain returning. Given PRN pain medication at 0426 and pain reassessed at 0526 with no relief for current pain patient endorses. Given scheduled medication at 0558. Reassess patient within the hour to see if improvement with the pain.
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
## 2022-06-24 NOTE — Progress Notes (Signed)
Patient ID: Alyssa Lopez, female   DOB: 10-15-2000, 22 y.o.   MRN: [id number]  Discontinued all sutures. Patient tolerated well with minimal pain or discomfort. Placed foam dressings over some areas at patient request.

## 2022-06-25 DIAGNOSIS — R52 Pain, unspecified: Secondary | ICD-10-CM | POA: Diagnosis not present

## 2022-06-25 DIAGNOSIS — R7401 Elevation of levels of liver transaminase levels: Secondary | ICD-10-CM

## 2022-06-25 DIAGNOSIS — K5901 Slow transit constipation: Secondary | ICD-10-CM | POA: Diagnosis not present

## 2022-06-25 DIAGNOSIS — T07XXXA Unspecified multiple injuries, initial encounter: Secondary | ICD-10-CM | POA: Diagnosis not present

## 2022-06-25 LAB — COMPREHENSIVE METABOLIC PANEL
ALT: 64 U/L — ABNORMAL HIGH (ref 0–44)
AST: 41 U/L (ref 15–41)
Albumin: 3.1 g/dL — ABNORMAL LOW (ref 3.5–5.0)
Alkaline Phosphatase: 391 U/L — ABNORMAL HIGH (ref 38–126)
Anion gap: 8 (ref 5–15)
BUN: 14 mg/dL (ref 6–20)
CO2: 26 mmol/L (ref 22–32)
Calcium: 9.5 mg/dL (ref 8.9–10.3)
Chloride: 101 mmol/L (ref 98–111)
Creatinine, Ser: 0.55 mg/dL (ref 0.44–1.00)
GFR, Estimated: >60 mL/min (ref >60)
Glucose, Bld: 89 mg/dL (ref 70–99)
Potassium: 4.1 mmol/L (ref 3.5–5.1)
Sodium: 135 mmol/L (ref 135–145)
Total Bilirubin: 0.6 mg/dL (ref 0.3–1.2)
Total Protein: 6.3 g/dL — ABNORMAL LOW (ref 6.5–8.1)

## 2022-06-25 LAB — CBC
HCT: 34 % — ABNORMAL LOW (ref 36.0–46.0)
Hemoglobin: 10.9 g/dL — ABNORMAL LOW (ref 12.0–15.0)
MCH: 29.8 pg (ref 26.0–34.0)
MCHC: 32.1 g/dL (ref 30.0–36.0)
MCV: 92.9 fL (ref 80.0–100.0)
Platelets: 364 10*3/uL (ref 150–400)
RBC: 3.66 MIL/uL — ABNORMAL LOW (ref 3.87–5.11)
RDW: 15.7 % — ABNORMAL HIGH (ref 11.5–15.5)
WBC: 6.3 10*3/uL (ref 4.0–10.5)
nRBC: 0 % (ref 0.0–0.2)

## 2022-06-25 MED ORDER — ONDANSETRON HCL 4 MG PO TABS
4.0000 mg | ORAL_TABLET | Freq: Three times a day (TID) | ORAL | Status: DC | PRN
  Filled 2022-06-25 (×2): qty 1

## 2022-06-25 MED ORDER — SORBITOL 70 % SOLN
60.0000 mL | Status: AC
  Administered 2022-06-25: 60 mL via ORAL
  Filled 2022-06-25: qty 60

## 2022-06-25 MED ORDER — ONDANSETRON HCL 4 MG/2ML IJ SOLN
4.0000 mg | Freq: Three times a day (TID) | INTRAMUSCULAR | Status: DC | PRN
  Administered 2022-06-25 – 2022-06-27 (×3): 4 mg via INTRAVENOUS
  Filled 2022-06-25 (×3): qty 2

## 2022-06-25 NOTE — Progress Notes (Signed)
Occupational Therapy Note  Patient Details  Name: Alyssa Lopez MRN: [id number] Date of Birth: Oct 31, 2000  Today's Date: 06/25/2022 OT Missed Time: 75 Minutes Missed Time Reason: Pain;Patient ill (comment);Other (comment)  Patient missed 75 minutes of skilled OT treatment session due to pain and nausea. Per nursing and mother, pt threw up all of her meds this morning as soon as she took them. Pt is currently nauseated and has no pain control as she threw up meds and is unable to get more oxycodone at this time. OT will follow up as time allows.    Daneen Schick  06/25/2022, 2:51 PM

## 2022-06-25 NOTE — Progress Notes (Signed)
Inpatient Rehabilitation  Patient information reviewed and entered into eRehab system by  Curtis Cain, OTR/L, Rehab Quality Coordinator.   Information including medical coding, functional ability and quality indicators will be reviewed and updated through discharge.   
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
## 2022-06-25 NOTE — Discharge Instructions (Addendum)
Inpatient Rehab Discharge Instructions  Alyssa Lopez Discharge date and time:  07/09/2022  Activities/Precautions/ Functional Status: Activity: no lifting, driving, or strenuous exercise until cleared by MD. After follow-up with Dr. Marcelino Scot, will discuss weight-bearing for distance. Diet: regular diet Wound Care: keep wound clean and dry Functional status:  ___ No restrictions     ___ Walk up steps independently ___ 24/7 supervision/assistance   ___ Walk up steps with assistance __x_ Intermittent supervision/assistance  ___ Bathe/dress independently ___ Walk with walker     ___ Bathe/dress with assistance ___ Walk Independently    ___ Shower independently ___ Walk with assistance    _x__ Shower with assistance __x_ No alcohol     ___ Return to work/school ________  Special Instructions: No driving, alcohol consumption or tobacco use.   COMMUNITY REFERRALS UPON DISCHARGE:    Outpatient: PT                  Agency: [facility]    YPPJK:[phone]              Appointment Date/Time:*Please expect follow-up within 7-10 business days to schedule your appointment. If you have not received follow-up, be sure to contact site directly.*  Medical Equipment/Items Ordered:bariatric drop arm bedside commode, wheelchair, fully electric hospital bed                                                 Agency/Supplier: [phone]   My questions have been answered and I understand these instructions. I will adhere to these goals and the provided educational materials after my discharge from the hospital.  Patient/Caregiver Signature _______________________________ Date __________  Clinician Signature _______________________________________ Date __________  Please bring this form and your medication list with you to all your follow-up doctor's appointments.

## 2022-06-25 NOTE — Progress Notes (Signed)
Nursing assisted by PT attempted Soap suds enema. Nurse writing was successful with administering 1000 mL of enema. Pt requried disimpaction and tolerated well with slight discomfort. Pt has had multiple liquid stools and explained to patient and family that it is normal and will most likely happen throughout the night. Pt stated she has felt much relief, PRN pain meds requested, chicken noodle soup ordered per nursing recommendations since pt has not had much food intake in a few days. Pt understands that she can increase diet as tolerated. All needs met at this time, father at bedside, call light within reach.
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
## 2022-06-25 NOTE — Progress Notes (Signed)
Pt had an episode of vomiting this AM with med pass. Scheduled meds, PRN pain meds, and sorbitol were in the emesis bag still whole. Nurse gave Zofran IV to relieve nausea. Will try to give Sorbitol again in a few hours.

## 2022-06-25 NOTE — Care Management (Signed)
[facility] Individual Statement of Services  Patient Name:  Alyssa Lopez  Date:  06/25/2022  Welcome to the [facility].  Our goal is to provide you with an individualized program based on your diagnosis and situation, designed to meet your specific needs.  With this comprehensive rehabilitation program, you will be expected to participate in at least 3 hours of rehabilitation therapies Monday-Friday, with modified therapy programming on the weekends.  Your rehabilitation program will include the following services:  Physical Therapy (PT), Occupational Therapy (OT), 24 hour per day rehabilitation nursing, Therapeutic Recreaction (TR), Psychology, Neuropsychology, Care Coordinator, Rehabilitation Medicine, [place], and Other  Weekly team conferences will be held on Tuesdays to discuss your progress.  Your Inpatient Rehabilitation Care Coordinator will talk with you frequently to get your input and to update you on team discussions.  Team conferences with you and your family in attendance may also be held.  Expected length of stay: 2.5-3 weeks    Overall anticipated outcome: Minimal Assistance  Depending on your progress and recovery, your program may change. Your Inpatient Rehabilitation Care Coordinator will coordinate services and will keep you informed of any changes. Your Inpatient Rehabilitation Care Coordinator's name and contact numbers are listed  below.  The following services may also be recommended but are not provided by the [facility] will be made to provide these services after discharge if needed.  Arrangements include referral to agencies that provide these services.  Your insurance has been verified to be:  [employer] [employer]  Your primary doctor is:  Einar Gip  Pertinent information will be shared with your doctor and your insurance company.  Inpatient Rehabilitation Care Coordinator:  Cathleen Corti [phone] or ([phone]  Information discussed with and copy given to patient by: Rana Snare, 06/25/2022, 9:53 AM
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
## 2022-06-25 NOTE — Progress Notes (Signed)
Patient ID: Alyssa Lopez, female   DOB: 08/30/2000, 22 y.o.   MRN: [id number]  SW spoke with pt mother Alyssa Lopez to introduce self, explain role, explain d/c process, discuss d/c plan and inform on ELOS. She confirms her dtr can return to her home, and she works from home (8am-2pm) and there will be continued support from various family members. SW informed will follow-up with updates after team conference.   Loralee Pacas, MSW, [place] Office: [phone] Cell: [phone] Fax: [phone]

## 2022-06-25 NOTE — Progress Notes (Signed)
Attempted to meet with patient. Tearful, other in room reported that in a lot of pain. Notified nurse of pain. No learning took place. Will try again at later time.

## 2022-06-25 NOTE — Progress Notes (Signed)
Inpatient Rehabilitation Care Coordinator Assessment and Plan Patient Details  Name: Alyssa Lopez MRN: [id number] Date of Birth: [birth year]  Today's Date: 06/25/2022  Hospital Problems: Principal Problem:   Critical polytrauma  Past Medical History: History reviewed. No pertinent past medical history. Past Surgical History:  Past Surgical History:  Procedure Laterality Date   ADENOIDECTOMY     FEMUR IM NAIL Left 06/01/2022   Procedure: INTRAMEDULLARY (IM) NAIL FEMORAL;  Surgeon: Myrene Galas, MD;  Location: [facility] OR;  Service: Orthopedics;  Laterality: Left;   FEMUR IM NAIL Left 05/31/2022   Procedure: IRRIGATION AND DEBRIDEMENT LEFT THIGH WOUND;  Surgeon: Myrene Galas, MD;  Location: [facility] OR;  Service: Orthopedics;  Laterality: Left;   FOOT SURGERY     LAPAROTOMY N/A 05/31/2022   Procedure: EXPLORATORY LAPAROTOMY repair of left diaphragmtic hernia, insertion of left chest tube;  Surgeon: Andria Meuse, MD;  Location: [facility] OR;  Service: General;  Laterality: N/A;   OPEN REDUCTION INTERNAL FIXATION (ORIF) DISTAL RADIAL FRACTURE Right 06/01/2022   Procedure: OPEN REDUCTION INTERNAL FIXATION (ORIF) DISTAL RADIUS FRACTURE;  Surgeon: Myrene Galas, MD;  Location: [facility] OR;  Service: Orthopedics;  Laterality: Right;   ORIF ELBOW FRACTURE Left 06/06/2022   Procedure: OPEN REDUCTION INTERNAL FIXATION (ORIF) ELBOW/OLECRANON FRACTURE;  Surgeon: Roby Lofts, MD;  Location: [facility] OR;  Service: Orthopedics;  Laterality: Left;   ORIF PATELLA Right 05/31/2022   Procedure: OPEN REDUCTION INTERNAL FIXATION (ORIF) PATELLA;  Surgeon: Myrene Galas, MD;  Location: [facility] OR;  Service: Orthopedics;  Laterality: Right;   SACRO-ILIAC PINNING Left 05/31/2022   Procedure: SACRO-ILIAC PINNING;  Surgeon: Myrene Galas, MD;  Location: [facility] OR;  Service: Orthopedics;  Laterality: Left;   Social History:  reports that she has never smoked. She has never used smokeless tobacco. She reports that she does not drink  alcohol and does not use drugs.  Family / Support Systems Marital Status: Single Spouse/Significant Other: N/A Children: No children Other Supports: parents and other family members Anticipated Caregiver: parents Ability/Limitations of Caregiver: Pt mother works from home (8am-2pm) and able to provide support; reports support from other family members as well. Caregiver Availability: 24/7 Family Dynamics: Pt lives with her parents.  Social History Preferred language: English Religion: Christian Cultural Background: Pt is current in school at [employer] towards Tax adviser in [employer] and works at [employer] in [employer]. 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 Pt is waiting on FMLA forms from supervisor Legal History/Current Legal Issues: Denies Guardian/Conservator: N/A   Abuse/Neglect Abuse/Neglect Assessment Can Be Completed: Yes Physical Abuse: Denies Verbal Abuse: Denies Sexual Abuse: Denies Exploitation of patient/patient's resources: Denies Self-Neglect: Denies  Patient response to: Social Isolation - How often do you feel lonely or isolated from those around you?: Never  Emotional Status Pt's affect, behavior and adjustment status: Pt tired at time of visit but willing to complete assessment. Pt would really like to walk and be able to get to the toilet. She understands that she could leave at a w/c level and is ok with this. Recent Psychosocial Issues: Denies Psychiatric History: Pt reports a hx of anxiety and currently enrolled with counseling at [facility]; therapist is Terri. She takes Lexapro for anxiety and is managed by PCP.  Denies any psych  hospitalizations. Substance Abuse History: Pt admits she may drink sometimes on the weekend, Amount may vary. Denies rec drug use.  Patient / Family Perceptions, Expectations & Goals Pt/Family understanding of illness & functional limitations: pt and family have a general understanding of pt care needs Premorbid pt/family roles/activities: Independent Anticipated changes in roles/activities/participation: Assistance with ADLs/IADLs Pt/family expectations/goals: Pt goal is "I want to walk and be able to get to the toilet."  [employer]: None Premorbid Home Care/DME Agencies: None Transportation available at discharge: TBD Is the patient able to respond to transportation needs?: Yes In the past 12 months, has lack of transportation kept you from medical appointments or from getting medications?: No In the past 12 months, has lack of transportation kept you from meetings, work, or from getting things needed for daily living?: No Resource referrals recommended: Neuropsychology  Discharge Planning Living Arrangements: Parent Support Systems: Parent, Other relatives Type of Residence: Private residence Insurance Resources: Multimedia programmer (specify) Nurse, mental health) Financial Resources: Employment, Secondary school teacher Screen Referred: No Living Expenses: Lives with family Money Management: Family Does the patient have any problems obtaining your medications?: No Home Management: Pt helps assists with cleaning up behind herself in the home. Patient/Family Preliminary Plans: TBD Care Coordinator Barriers to Discharge: Decreased caregiver support, Insurance for SNF coverage Care Coordinator Anticipated Follow Up Needs: HH/OP Expected length of stay: 2.5-3 weeks  Clinical Impression SW met with pt and pt father Marden Noble in room to introduce self, explain role, and discuss discharge process. Pt is not a English as a second language teacher. No HCPOA. New PCP- Windell Hummingbird. Changed in system. No DME.  Good family support at discharge.    A  06/25/2022, 2:22 PM

## 2022-06-26 DIAGNOSIS — T07XXXA Unspecified multiple injuries, initial encounter: Secondary | ICD-10-CM | POA: Diagnosis not present

## 2022-06-26 DIAGNOSIS — K5901 Slow transit constipation: Secondary | ICD-10-CM | POA: Diagnosis not present

## 2022-06-26 DIAGNOSIS — R52 Pain, unspecified: Secondary | ICD-10-CM | POA: Diagnosis not present

## 2022-06-26 DIAGNOSIS — R7401 Elevation of levels of liver transaminase levels: Secondary | ICD-10-CM | POA: Diagnosis not present

## 2022-06-26 MED ORDER — CLONAZEPAM 0.25 MG PO TBDP
0.2500 mg | ORAL_TABLET | Freq: Two times a day (BID) | ORAL | Status: DC
  Administered 2022-06-26 – 2022-07-06 (×21): 0.25 mg via ORAL
  Filled 2022-06-26 (×21): qty 1

## 2022-06-26 MED ORDER — GABAPENTIN 400 MG PO CAPS
800.0000 mg | ORAL_CAPSULE | Freq: Three times a day (TID) | ORAL | Status: DC
  Administered 2022-06-26 – 2022-07-02 (×18): 800 mg via ORAL
  Filled 2022-06-26 (×18): qty 2
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
## 2022-06-26 NOTE — Progress Notes (Signed)
Patient ID: Alyssa Lopez, female   DOB: Jun 07, 2001, 22 y.o.   MRN: [id number]  SW received phone call from pt mother [name] reporting a letter is needed to provide to the school to discuss reason for not returning this semester. SW shared will inform attending.   SW met with pt and pt mother to provide updates from team conference, and d/c date 1/25. SW reiterated she will d/c at w/c level at this time, and therapies will de deferred until WB status changes. SW informed asked for letter from attending.   Loralee Pacas, MSW, [place] Office: [phone] Cell: [phone] Fax: [phone]

## 2022-06-26 NOTE — Progress Notes (Signed)
Occupational Therapy Session Note  Patient Details  Name: Alyssa Lopez MRN: [id number] Date of Birth: Jul 09, 2000  Today's Date: 06/26/2022 OT Individual Time: 0800-0900 OT Individual Time Calculation (min): 60 min    Short Term Goals: Week 1:  OT Short Term Goal 1 (Week 1): Pt will tolerate EOB >5 min to demo improved pain and tolerance ot upright with MIN A for sitting balance OT Short Term Goal 2 (Week 1): Pt will groom with setup OT Short Term Goal 3 (Week 1): Pt will initate OOB transfer training in prep for [facility] transfer OT Short Term Goal 4 (Week 1): Pt will complete 3/4 steps of donning shirt  Skilled Therapeutic Interventions/Progress Updates:    Pt received in bed with unrated, premedicated pain. Rest and repositiong provided for pain relief  ADL: Pt completes ADL at overall MIN-MOD A Level. Skilled interventions include: education on use of foam handles/LH equipment for improved grip with RUE while having wrist splint- provided for toothbrush, spoon and larger handle for LHSS. Pt able ot wash all parts with rag and LHSS with A for cleaning back and buttocks. Used hooking elbows technique for semifowlers>long sitting to wash back and pull shirt down back. MOD A to doff night gown and MIN A to don nightgown. Grooming with supervision for oral care  Pt left at end of session in bed with exit alarm on, call light in reach and all needs met   Therapy Documentation Precautions:  Precautions Precautions: Fall Precaution Comments: R knee can flex past 45 degrees now, "Bledsoe discontinued.per [name], PA.  No written order in the chart. Required Braces or Orthoses: Cervical Brace Knee Immobilizer - Right: Other (comment) Cervical Brace: Soft collar, At all times Splint/Cast: R wrist splint on at all times Restrictions Weight Bearing Restrictions: (P) Yes RUE Weight Bearing: Non weight bearing LUE Weight Bearing: Non weight bearing RLE Weight Bearing: Non weight bearing LLE  Weight Bearing: Weight bearing as tolerated Other Position/Activity Restrictions: [name] PA note as of 12/26: R UEx: unrestricted motion of fingers, shoulder and elbow   L UEx: gentle elbow motion as tolerated. No active extension against resistance. Digit, forearm, shoulder motion as tolerated   R LEx: start gentle knee ROM 0-45 degrees.  No active knee extension against resistance x 6 weeks.  Unrestricted hip and ankle motion. Hinged brace only on when mobilizing  L LEx: unrestricted motion of hip, knee and ankle. WBAT transfers only  Therapy/Group: Individual Therapy  Tonny Branch 06/26/2022, 6:52 AM

## 2022-06-26 NOTE — Progress Notes (Signed)
Occupational Therapy Session Note  Patient Details  Name: Alyssa Lopez MRN: [id number] Date of Birth: [birth year]  Today's Date: 06/26/2022 OT Individual Time: 5027-7412 OT Individual Time Calculation (min): 43 min    Short Term Goals: Week 1:  OT Short Term Goal 1 (Week 1): Pt will tolerate EOB >5 min to demo improved pain and tolerance ot upright with MIN A for sitting balance OT Short Term Goal 2 (Week 1): Pt will groom with setup OT Short Term Goal 3 (Week 1): Pt will initate OOB transfer training in prep for [facility] transfer OT Short Term Goal 4 (Week 1): Pt will complete 3/4 steps of donning shirt  Skilled Therapeutic Interventions/Progress Updates:    1:1. Pt received in bed with unrated knee pain but intermittently sleepy. Pt, RN and mother agreeable to trial regular hospital bed to improve mobility and stabiltiy with sitting EOB and trasnfers. Pt and mother educated on turning schedule to decrease risk of skin breakdown. Pt, tech and RN use maxi slide sheet to transfer pt into second bed. Pt then with A of tech, OT and mother requires total A to sit EOB from suipine. Pt able to hold sitting balance with supervision at EOB with Les propped up on trashcan. Pt without grimacing, dizziness or significant pain increase at EOB. Discussed getting up in the chair with PT and pt agreeable. Exited session with pt seated in bed, exit alarm on and call light in reach   Therapy Documentation Precautions:  Precautions Precautions: Fall Precaution Comments: R knee can flex past 45 degrees now, "Bledsoe discontinued.per [name], PA.  No written order in the chart. Required Braces or Orthoses: Cervical Brace Knee Immobilizer - Right: Other (comment) Cervical Brace: Soft collar, At all times Splint/Cast: R wrist splint on at all times Restrictions Weight Bearing Restrictions: Yes RUE Weight Bearing: Non weight bearing LUE Weight Bearing: Non weight bearing RLE Weight Bearing: Non weight  bearing LLE Weight Bearing: Weight bearing as tolerated Other Position/Activity Restrictions: [name] PA note as of 12/26: R UEx: unrestricted motion of fingers, shoulder and elbow   L UEx: gentle elbow motion as tolerated. No active extension against resistance. Digit, forearm, shoulder motion as tolerated   R LEx: start gentle knee ROM 0-45 degrees.  No active knee extension against resistance x 6 weeks.  Unrestricted hip and ankle motion. Hinged brace only on when mobilizing  L LEx: unrestricted motion of hip, knee and ankle. WBAT transfers only  Therapy/Group: Individual Therapy  Tonny Branch 06/26/2022, 12:02 PM

## 2022-06-26 NOTE — Progress Notes (Signed)
Physical Therapy Session Note  Patient Details  Name: Alyssa Lopez MRN: [id number] Date of Birth: 19-Apr-2001  Today's Date: 06/26/2022 PT Individual Time: 1030-1100 and 1856-3149 PT Individual Time Calculation (min): 30 min and 70 min   Short Term Goals: Week 1:  PT Short Term Goal 1 (Week 1): Pt will roll side to side w/ 1 peron assist and mod A PT Short Term Goal 2 (Week 1): Pt will transfer sup to sit w/ max A w/ +2 PT Short Term Goal 3 (Week 1): Pt will transfer bed<> w/c/recliner w/ max A + 2. PT Short Term Goal 4 (Week 1): Pt will tolerate sitting EOB w/ supervision x 10 min.  Skilled Therapeutic Interventions/Progress Updates:     Session 1: Patient in bed with her mother at bedside upon PT arrival. Patient alert and agreeable to PT session. Patient reported 6/10 B lower extremity pain and burning in her feet, R>L, during session, RN made aware and provided medication prior to session. PT provided repositioning, rest breaks, and distraction as pain interventions throughout session.   Patient and RN reported successful BM with patient reporting improved abdominal pain today and in brighter spirits. Reviewed pain/stress management strategies at beginning of session. Focused session on lower extremity ROM exercises and applied B thigh high ACE wraps for edema management. Patient reported some reduced lower extremity pain with wraps last night and wraps were doffed at night. Reviewed wearing schedule and use of PRAFO with patient and her mother during session.   Therapeutic Exercise: Patient performed the following exercises with verbal and tactile cues for proper technique. -B ankle pumps 2x10, unable to actively attain neutral DF bilaterally R worse than L -B gluteal sets 2x10 -B quad sets x10 -B knee/hip flexion 2x3, tolerated >10 deg flexion on R and >20 deg flexion on L today  Patient in bed with her mother at bedside at end of session with breaks locked, 4 rails up per patient  request, and all needs within reach.   Session 2: Patient in bed with her mother at bedside upon PT arrival. Patient alert and agreeable to PT session. Patient reported 6-7/10 B lower extremity pain during session, RN made aware and provided pain medication at beginning of session. PT provided repositioning, rest breaks, and distraction as pain interventions throughout session.   Patient agreeable to focus on sitting EOB and initiating SBT to w/c this session. Reports sitting EOB with OT went well this morning. Reviewed precautions, patient able to teach back 100%, spent increased time discussing technique for sitting EOB and progression to transfer to minimize patient's anxiety with movement with good response.   Therapeutic Activity:  Patient performed rolling R/L with max A +2 with intermittent support from her mother for lower extremity management as paper scrub shorts were donned for SBT. She performed supine to sit with max A +2 performing log roll R and using R elbow to assist with sitting up without weight through the wrist or forearm to maintain precautions. Provided verbal cues for sequencing and setting her bottom elbow and bringing her L shoulder forward to bring her trunk up to sitting. Performed scooting EOB with total A x2. Patient sat EOB >5 min with PT sitting beside her providing lumbar and trunk support to reduce back pain in sitting and a trash can supporting B lower extremities due to limited knee flexion in sitting. Eventually tolerated her L leg to hand and flex with gravity assist while her R leg was supported by a  second person.   Set up w/c on her R side. Patient initiated a slide board transfer bed<>w/c  with total A +2 and lower limb support from her mother throughout and total A for board placement. Patient tolerated transfer well with minimal increase in pain, however, due to the board and w/c sliding during the transfer, this PT opted to return the patient back to bed for  safety. Discussed improved positioning with +2 behind patient and w/c to stabilize w/c and use of maxi slide during next transfer trial.   To set patient and family expectations for transfers. PT demonstrated an independent slide board transfer recliner<>w/c demonstrating a transfer with L lower extremity weight bearing and R upper extremity weight bearing through elbow using a yoga block. Educated on expectation for min-mod A of 1 person with transfers at d/c. Discussed family training prior to d/c for primary caregivers. Educated on need for at least 90 deg knee flexion on L to allow her L lower extremity to assist with transfers, patient and her mother stated understanding and appreciative of education.   Focused remainder of session on ROM exercises.  Therapeutic Exercise: Patient performed the following exercises with verbal and tactile cues for proper technique. -B knee/hip flexion 2x5 L progressing to 10-15 deg, R >20 deg each trial with mild spasms with pain to patient tolerance -B DF stretch 2x1 min  -B hip abd/add 2x10 -soft tissue mobilization to L thigh with significant muscle tightness along rectus femoris x 5 min  Patient in bed with her mother at end of session with breaks locked, 4 rails up per patient request, and all needs within reach.   Therapy Documentation Precautions:  Precautions Precautions: Fall Precaution Comments: R knee can flex past 45 degrees now, "Bledsoe discontinued.per [name], PA.  No written order in the chart. Required Braces or Orthoses: Cervical Brace Knee Immobilizer - Right: Other (comment) Cervical Brace: Soft collar, At all times Splint/Cast: R wrist splint on at all times Restrictions Weight Bearing Restrictions: Yes RUE Weight Bearing: Non weight bearing LUE Weight Bearing: Non weight bearing RLE Weight Bearing: Non weight bearing LLE Weight Bearing: Weight bearing as tolerated Other Position/Activity Restrictions: [name] PA note as  of 12/26: R UEx: unrestricted motion of fingers, shoulder and elbow   L UEx: gentle elbow motion as tolerated. No active extension against resistance. Digit, forearm, shoulder motion as tolerated   R LEx: start gentle knee ROM 0-45 degrees.  No active knee extension against resistance x 6 weeks.  Unrestricted hip and ankle motion. Hinged brace only on when mobilizing  L LEx: unrestricted motion of hip, knee and ankle. WBAT transfers only    Therapy/Group: Individual Therapy   L  PT, DPT, NCS, CBIS  06/26/2022, 6:51 PM

## 2022-06-27 DIAGNOSIS — R7401 Elevation of levels of liver transaminase levels: Secondary | ICD-10-CM | POA: Diagnosis not present

## 2022-06-27 DIAGNOSIS — R52 Pain, unspecified: Secondary | ICD-10-CM | POA: Diagnosis not present

## 2022-06-27 DIAGNOSIS — T07XXXA Unspecified multiple injuries, initial encounter: Secondary | ICD-10-CM | POA: Diagnosis not present

## 2022-06-27 DIAGNOSIS — K5901 Slow transit constipation: Secondary | ICD-10-CM | POA: Diagnosis not present

## 2022-06-27 MED ORDER — VITAMIN D (ERGOCALCIFEROL) 1.25 MG (50000 UNIT) PO CAPS
50000.0000 [IU] | ORAL_CAPSULE | ORAL | Status: DC
  Administered 2022-06-28 – 2022-07-12 (×3): 50000 [IU] via ORAL
  Filled 2022-06-27 (×3): qty 1
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
## 2022-06-27 NOTE — Progress Notes (Signed)
Physical Therapy Session Note  Patient Details  Name: Alyssa Lopez MRN: [id number] Date of Birth: 2000/10/09  Today's Date: 06/27/2022 PT Individual Time: 1102-1200 and 1420-1529 PT Individual Time Calculation (min): 58 min and 69 min  Short Term Goals: Week 1:  PT Short Term Goal 1 (Week 1): Pt will roll side to side w/ 1 peron assist and mod A PT Short Term Goal 2 (Week 1): Pt will transfer sup to sit w/ max A w/ +2 PT Short Term Goal 3 (Week 1): Pt will transfer bed<> w/c/recliner w/ max A + 2. PT Short Term Goal 4 (Week 1): Pt will tolerate sitting EOB w/ supervision x 10 min.  Skilled Therapeutic Interventions/Progress Updates:     1st Session: Pt received semi-reclined in bed and agrees to therapy. Reports tingling pain in toes, L lower extremity>R lower extremity. PT provides repositioning and rest breaks to manage pain. Pt performs supine to sit with pt's mom managing bilateral lower extremities and PT providing manual assistance at trunk, with cues for sequencing and positioning. Pt positioned at edge of bed with bilateral lower extremities propped on sideways trashcan with pillow under heels for comfort. Therapy dog, Dixie, present. Initially pt requires minA for trunk support in sitting but pt progresses to static sitting with cues for positioning and posture, interacting with Dixie. While sitting, pt is able to verbalize orthopedic precautions to therapist, and is agreeable to attempting transfer to [facility].Pants threaded over pt's legs and pt performs reciprocal leans with modA to assist with pulling pants up. Pants are unable to be pulled up over hips in sitting, so PT places towel over slideboard to reduce sheer. Rehab tech provides trunk support and PT provides totalA to place slideboard undre pt's R thigh/gluteal crease. Bed raised slightly to allow for level transfer. Pt completes slideboard transfer from bed to [facility] on R side with totalA +2, with PT providing emphasis on anterior  trunk lean for pt to unweight buttocks and allow for increased ease of transfer. For final scoot pt is able to use R elbow to hook mother's arm for leverage. Elevating leg rests adjusted for pt's comfort. Pt then taken in [facility] with dependent assistance t introduce pt to different areas of rehab. Pt is encouraged to remain in sitting for lunch and for session with OT to work on activity tolerance and time out of bed. Pt is agreeable. Left seated in WC with all needs within reach.  2nd Session: Pt received supine in bed and agrees to therapy. Reports burning/tingling pain in bilateral feet and toes. Number not provided. PT provides rest breaks and education regarding pain perception to address pain. PT swaps pt's 18" x 18" WC with a 20" x 18" WC. Pt then performs supine therex for strengthening and increasing ROM of bilateral lower extremities. Pt performs following with verbal and tactile cues for correct performance 1x15: ankle pumps, quad sets, glute sets, hip abd/adduction, SAQs (R lower extremity requires AAROM), and SLRs (AAROM for both). Pt requires frequent rest breaks throughout exercises due to pain and fatigue. Following, pt completes x5 supine to sitting transitions, utilizing R upper extremity to hook PT with antecubital fossa and with cues to eccentrically control sit to supine with core muscles. Pt left semi-reclined with alarm intact and all needs within reach.  Therapy Documentation Precautions:  Precautions Precautions: Fall Precaution Comments: R knee can flex past 45 degrees now, "Bledsoe discontinued.per [name], PA.  No written order in the chart. Required Braces or Orthoses:  Cervical Brace Knee Immobilizer - Right: Other (comment) Cervical Brace: Soft collar, At all times Splint/Cast: R wrist splint on at all times Restrictions Weight Bearing Restrictions: (P) Yes RUE Weight Bearing: Non weight bearing LUE Weight Bearing: Non weight bearing RLE Weight Bearing: Non weight  bearing LLE Weight Bearing: Weight bearing as tolerated Other Position/Activity Restrictions: [name] PA note as of 12/26: R UEx: unrestricted motion of fingers, shoulder and elbow   L UEx: gentle elbow motion as tolerated. No active extension against resistance. Digit, forearm, shoulder motion as tolerated   R LEx: start gentle knee ROM 0-45 degrees.  No active knee extension against resistance x 6 weeks.  Unrestricted hip and ankle motion. Hinged brace only on when mobilizing  L LEx: unrestricted motion of hip, knee and ankle. WBAT transfers only   Therapy/Group: Individual Therapy  Breck Coons, PT, DPT 06/27/2022, 5:10 PM

## 2022-06-27 NOTE — Progress Notes (Signed)
Occupational Therapy Session Note  Patient Details  Name: Alyssa Lopez MRN: [id number] Date of Birth: 05-Jan-2001  Today's Date: 06/27/2022 OT Individual Time: 0938-1829 OT Individual Time Calculation (min): 57 min    Short Term Goals: Week 1:  OT Short Term Goal 1 (Week 1): Pt will tolerate EOB >5 min to demo improved pain and tolerance ot upright with MIN A for sitting balance OT Short Term Goal 2 (Week 1): Pt will groom with setup OT Short Term Goal 3 (Week 1): Pt will initate OOB transfer training in prep for [facility] transfer OT Short Term Goal 4 (Week 1): Pt will complete 3/4 steps of donning shirt  Skilled Therapeutic Interventions/Progress Updates:     Pt received in w/c with unrated pain  but given medication from RN.  ADL: Pt requires MAX A for hair washing at sink with hair washing tray. Alerted mom this could be purchased on [place] if needed at home. Pt then completes total A +2 (mom holding SB) to scoot back to bed with yoga block under R elbow to assist with pushing. Pt BLE on trash can and unable to tolerate bending at this time. Total A+2 to return to supine. Pt then rolls with MOD A to place bed pan and clean up from small BM.   Gave homework of 3x5 sit ups per day hooking arms to assist with sit up, and then eccentric control on her own to lower back to bed. Pt able to complete first set of the day with min-mod A to long sit then supervision to lower back to supine  Pt left at end of session in bed with exit alarm on, call light in reach and all needs met   Therapy Documentation Precautions:  Precautions Precautions: Fall Precaution Comments: R knee can flex past 45 degrees now, "Bledsoe discontinued.per [name], PA.  No written order in the chart. Required Braces or Orthoses: Cervical Brace Knee Immobilizer - Right: Other (comment) Cervical Brace: Soft collar, At all times Splint/Cast: R wrist splint on at all times Restrictions Weight Bearing Restrictions: (P)  Yes RUE Weight Bearing: Non weight bearing LUE Weight Bearing: Non weight bearing RLE Weight Bearing: Non weight bearing LLE Weight Bearing: Weight bearing as tolerated Other Position/Activity Restrictions: [name] PA note as of 12/26: R UEx: unrestricted motion of fingers, shoulder and elbow   L UEx: gentle elbow motion as tolerated. No active extension against resistance. Digit, forearm, shoulder motion as tolerated   R LEx: start gentle knee ROM 0-45 degrees.  No active knee extension against resistance x 6 weeks.  Unrestricted hip and ankle motion. Hinged brace only on when mobilizing  L LEx: unrestricted motion of hip, knee and ankle. WBAT transfers only General:    Therapy/Group: Individual Therapy  Tonny Branch 06/27/2022, 6:49 AM

## 2022-06-28 ENCOUNTER — Inpatient Hospital Stay (HOSPITAL_COMMUNITY): Payer: BC Managed Care – PPO

## 2022-06-28 MED ORDER — TAPENTADOL HCL 50 MG PO TABS
75.0000 mg | ORAL_TABLET | ORAL | Status: DC | PRN
  Administered 2022-06-28 – 2022-06-29 (×4): 75 mg via ORAL
  Filled 2022-06-28 (×4): qty 2

## 2022-06-28 MED ORDER — NORTRIPTYLINE HCL 10 MG PO CAPS
10.0000 mg | ORAL_CAPSULE | Freq: Every day | ORAL | Status: DC
  Administered 2022-06-28 – 2022-07-05 (×8): 10 mg via ORAL
  Filled 2022-06-28 (×8): qty 1

## 2022-06-28 NOTE — Progress Notes (Signed)
Occupational Therapy Session Note  Patient Details  Name: Alyssa Lopez MRN: [id number] Date of Birth: [birth year]  Today's Date: 06/28/2022 OT Individual Time: 1100-1210 OT Individual Time Calculation (min): 70 min    Short Term Goals: Week 1:  OT Short Term Goal 1 (Week 1): Pt will tolerate EOB >5 min to demo improved pain and tolerance ot upright with MIN A for sitting balance OT Short Term Goal 2 (Week 1): Pt will groom with setup OT Short Term Goal 3 (Week 1): Pt will initate OOB transfer training in prep for [facility] transfer OT Short Term Goal 4 (Week 1): Pt will complete 3/4 steps of donning shirt  Skilled Therapeutic Interventions/Progress Updates:     Pt received in bed with knee and hip pain unrated. RN delivered pain medication at end of session. Generally pt does very well with distraction and conversation to help manage pain  ADL: Pt legs wrapped for BLE management swelling with ace wraps after first time up to EOB. Pt voiding in bed with female urinal. Pt needs to strain so hard she is red in the face and has a BM to void. Messaged MD about this occurrence and difficulty with voiding. MOD A +2 with mom to roll and clean up postBM/bed linen change.   Therapeutic exercise B AAROM of knees 0-30* on L x15and 0-35* x15 on L (no active knee extension on L).  Ankle pumps x 10 BLE  Therapeutic activity Sup>long sit hooking RUE with OT elbow then mom providing A to support upright trunk as pt assisted to bring Les off EOB x2. SIGNIFICANT increased time needed for transitions. During conversation OT allows pt L knee to bend towards floor with toes ~1 inch from floor. When pt looks at foot pt reporting increased pain and reporting dizziness and needing to lay down.   Pt left at end of session in bed with exit alarm on, call light in reach and all needs met   Therapy Documentation Precautions:  Precautions Precautions: Fall Precaution Comments: R knee can flex past 45 degrees now,  "Bledsoe discontinued.per [name], PA.  No written order in the chart. Required Braces or Orthoses: Cervical Brace Knee Immobilizer - Right: Other (comment) Cervical Brace: Soft collar, At all times Splint/Cast: R wrist splint on at all times Restrictions Weight Bearing Restrictions: (P) Yes RUE Weight Bearing: Non weight bearing LUE Weight Bearing: Non weight bearing RLE Weight Bearing: Non weight bearing LLE Weight Bearing: Weight bearing as tolerated Other Position/Activity Restrictions: [name] PA note as of 12/26: R UEx: unrestricted motion of fingers, shoulder and elbow   L UEx: gentle elbow motion as tolerated. No active extension against resistance. Digit, forearm, shoulder motion as tolerated   R LEx: start gentle knee ROM 0-45 degrees.  No active knee extension against resistance x 6 weeks.  Unrestricted hip and ankle motion. Hinged brace only on when mobilizing  L LEx: unrestricted motion of hip, knee and ankle. WBAT transfers only General:   Therapy/Group: Individual Therapy  Tonny Branch 06/28/2022, 6:52 AM

## 2022-06-28 NOTE — Progress Notes (Signed)
Physical Therapy Session Note  Patient Details  Name: Alyssa Lopez MRN: [id number] Date of Birth: [birth year]  Today's Date: 06/28/2022  Short Term Goals: Week 1:  PT Short Term Goal 1 (Week 1): Pt will roll side to side w/ 1 peron assist and mod A PT Short Term Goal 2 (Week 1): Pt will transfer sup to sit w/ max A w/ +2 PT Short Term Goal 3 (Week 1): Pt will transfer bed<> w/c/recliner w/ max A + 2. PT Short Term Goal 4 (Week 1): Pt will tolerate sitting EOB w/ supervision x 10 min.  Skilled Therapeutic Interventions/Progress Updates:     0800: Patient in bed asleep with her mother at bedside upon PT arrival. Patient's mother reports poor sleep quality last night limited by B burning in the patient's feet that started in her toes and progressed to the plantar surface of her feet and up to her ankles L>R. Patient recently was provided pain medications and just got to sleep. PT to rearrange schedule to return at a later time to improved patient participation due to extreme fatigue this morning.   0845: Patient in bed still asleep, her mother requested to wait until a later time.  1000: Patient off unit for MRI.  1330: Patient in bed asleep with her mother at bedside. Patient slow to arouse to verbal stimulation and shook her head no and stated she could not tolerate PT at this time due to 10/10 pain in her feet and fatigue. PT rearranged schedule to return later this afternoon.  1445: Patient in bed asleep with her parents at bedside. Patient's mother reports that she had been crying in pain x45 min prior to PT arrival and had just relaxed enough to sleep. Reports that patient appears to have an anxiety component along with elevated pain levels and responds to massage. RN made aware and placed order for SCDs for pain management to simulate massage as needed.    Patient missed 150 min of skilled PT due to pain/fatigue/MRI with multiple attempts to reschedule sessions today, RN made aware.  Will attempt to make-up missed time as able.    Therapy Documentation Precautions:  Precautions Precautions: Fall Precaution Comments: R knee can flex past 45 degrees now, "Bledsoe discontinued.per [name], PA.  No written order in the chart. Required Braces or Orthoses: Cervical Brace Knee Immobilizer - Right: Other (comment) Cervical Brace: Soft collar, At all times Splint/Cast: R wrist splint on at all times Restrictions Weight Bearing Restrictions: (P) Yes RUE Weight Bearing: Non weight bearing LUE Weight Bearing: Non weight bearing RLE Weight Bearing: Non weight bearing LLE Weight Bearing: Weight bearing as tolerated Other Position/Activity Restrictions: [name] PA note as of 12/26: R UEx: unrestricted motion of fingers, shoulder and elbow   L UEx: gentle elbow motion as tolerated. No active extension against resistance. Digit, forearm, shoulder motion as tolerated   R LEx: start gentle knee ROM 0-45 degrees.  No active knee extension against resistance x 6 weeks.  Unrestricted hip and ankle motion. Hinged brace only on when mobilizing  L LEx: unrestricted motion of hip, knee and ankle. WBAT transfers only    Therapy/Group: Individual Therapy   L  PT, DPT, NCS, CBIS  06/28/2022, 6:21 AM

## 2022-06-28 NOTE — Progress Notes (Signed)
Late entry: Mother and patient verbalized increase sensations to feet of burning and pain. Patient was notified and had received PRN medication during the shift. Patient feet were re-assessed by writer, noted right dorsal pulse from prior assessment was at a 2 level,warm to touch,painful and sensitive to touch, currently at a 1 level, sensitive to touch,, warn to touch and remain with pain. Patient mother remains at  bedside and updated., will provide update to oncoming nurse

## 2022-06-28 NOTE — Progress Notes (Signed)
In bed with mother at bedside, in good spirits excited over progress she made while in therapy today.Support express and assisted with repositioning for comfort.
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
## 2022-06-28 NOTE — Progress Notes (Signed)
Recreational Therapy Session Note  Patient Details  Name: Alyssa Lopez MRN: [id number] Date of Birth: 2000-07-18 Today's Date: 06/28/2022  Pt in bed resting upon arrival, father at bedside.  Both report a bad night last night and pt with c/o pain.  Both state pain medicine had been administered.  Eval deferred today.  Will attempt again at another time.  , 06/28/2022, 3:58 PM
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
## 2022-06-29 ENCOUNTER — Inpatient Hospital Stay (HOSPITAL_COMMUNITY): Payer: BC Managed Care – PPO

## 2022-06-29 DIAGNOSIS — S7400XS Injury of sciatic nerve at hip and thigh level, unspecified leg, sequela: Secondary | ICD-10-CM

## 2022-06-29 DIAGNOSIS — F411 Generalized anxiety disorder: Secondary | ICD-10-CM

## 2022-06-29 MED ORDER — TAPENTADOL HCL 50 MG PO TABS
100.0000 mg | ORAL_TABLET | ORAL | Status: DC | PRN
  Administered 2022-06-29 – 2022-07-01 (×5): 100 mg via ORAL
  Filled 2022-06-29 (×5): qty 2

## 2022-06-29 MED ORDER — OXYCODONE HCL ER 15 MG PO T12A
15.0000 mg | EXTENDED_RELEASE_TABLET | Freq: Two times a day (BID) | ORAL | Status: DC
  Administered 2022-06-29 – 2022-07-01 (×5): 15 mg via ORAL
  Filled 2022-06-29 (×5): qty 1

## 2022-06-29 MED ORDER — BOOST / RESOURCE BREEZE PO LIQD CUSTOM
1.0000 | Freq: Three times a day (TID) | ORAL | Status: DC
  Administered 2022-06-30 – 2022-07-12 (×25): 1 via ORAL

## 2022-06-29 MED ORDER — HYDROMORPHONE HCL 1 MG/ML IJ SOLN
1.0000 mg | Freq: Three times a day (TID) | INTRAMUSCULAR | Status: DC | PRN
  Administered 2022-07-01 – 2022-07-11 (×14): 1 mg via INTRAVENOUS
  Filled 2022-06-29 (×16): qty 1

## 2022-06-29 NOTE — Progress Notes (Signed)
Physical Therapy Session Note  Patient Details  Name: Alyssa Lopez MRN: [id number] Date of Birth: [birth year]  Today's Date: 06/29/2022 PT Individual Time: 1302-1400 PT Individual Time Calculation (min): 58 min   Short Term Goals: Week 1:  PT Short Term Goal 1 (Week 1): Pt will roll side to side w/ 1 peron assist and mod A PT Short Term Goal 2 (Week 1): Pt will transfer sup to sit w/ max A w/ +2 PT Short Term Goal 3 (Week 1): Pt will transfer bed<> w/c/recliner w/ max A + 2. PT Short Term Goal 4 (Week 1): Pt will tolerate sitting EOB w/ supervision x 10 min.  Skilled Therapeutic Interventions/Progress Updates: Pt presents supine in bed w/ father present and reluctantly agreeable to therapy.  Pt states pain and not feeling well, but encouraged to participate w/ therapy.  Pt tolerated abd/add AAROM B Les, R > L.  Pt rolled side to side for Lidoderm patch placement and slider placement for transfer to EOB.  Father maintains LE s during turning as well as sup to sit.  Pt requires A + 2 for sup to sit.  Pt able to lower feet to overturned trash can and maintain seated balance w/ only CGA.  Pt then c/o dizziness.  Pt returned to supine and slide removed when able.  Pt tolerated LE AAROM for knee flexion.  Improved ROM noted w/ long stretches.  Pt remained supine in bed w/ father present, all needs in reach.       Therapy Documentation Precautions:  Precautions Precautions: Fall Precaution Comments: R knee can flex past 45 degrees now, "Bledsoe discontinued.per [name], PA.  No written order in the chart. Required Braces or Orthoses: Cervical Brace Knee Immobilizer - Right: Other (comment) Cervical Brace: Soft collar, At all times Splint/Cast: R wrist splint on at all times Restrictions Weight Bearing Restrictions: Yes RUE Weight Bearing: Non weight bearing LUE Weight Bearing: Non weight bearing RLE Weight Bearing: Non weight bearing LLE Weight Bearing: Weight bearing as tolerated Other  Position/Activity Restrictions: [name] PA note as of 12/26: R UEx: unrestricted motion of fingers, shoulder and elbow   L UEx: gentle elbow motion as tolerated. No active extension against resistance. Digit, forearm, shoulder motion as tolerated   R LEx: start gentle knee ROM 0-45 degrees.  No active knee extension against resistance x 6 weeks.  Unrestricted hip and ankle motion. Hinged brace only on when mobilizing  L LEx: unrestricted motion of hip, knee and ankle. WBAT transfers only General:   Vital Signs: Therapy Vitals Temp: 98.3 F (36.8 C) Temp Source: Oral Pulse Rate: 80 Resp: 18 BP: (!) 147/90 Patient Position (if appropriate): Lying Oxygen Therapy SpO2: 96 % O2 Device: Room Air Pain:at least 8/10 Pain Assessment Pain Score: Asleep   Therapy/Group: Individual Therapy  Ladoris Gene 06/29/2022, 4:02 PM

## 2022-06-29 NOTE — Progress Notes (Signed)
Occupational Therapy Note  Patient Details  Name: Alyssa Lopez MRN: [id number] Date of Birth: July 05, 2000  Today's Date: 06/29/2022 OT Missed Time: 33 Minutes Missed Time Reason: CT/MRI  Patient off the unit for CT scan. OT will follow up per plan of care and patient availability.    Daneen Schick  06/29/2022, 12:17 PM

## 2022-06-29 NOTE — Progress Notes (Signed)
Occupational Therapy Weekly Progress Note  Patient Details  Name: Alyssa Lopez MRN: [id number] Date of Birth: 14-Apr-2001  Beginning of progress report period: June 23, 2022 End of progress report period: June 29, 2022  Today's Date: 06/29/2022 OT Individual Time: 1420-1435 OT Individual Time Calculation (min): 15 min  (make up time from earlier session) Today's Date: 06/29/2022 OT Missed Time: 7 Minutes Missed Time Reason: Patient fatigue   Patient has met 4 of 4 short term goals.  Pt is making slow progress towards OT goals. Pt has been mostly limited by pain, dizziness (reaction to pain), and WB restrictions. Pt currently requires MOD A for rolling for LB dressing at bed level, MOD A for UB dressing, +2 for supine>sit and +3 (3rd to hold the board), and set up for grooming/self feeding with built up handles.    Patient continues to demonstrate the following deficits: muscle weakness and muscle joint tightness, decreased cardiorespiratoy endurance, and decreased sitting balance, decreased balance strategies, and difficulty maintaining precautions and therefore will continue to benefit from skilled OT intervention to enhance overall performance with BADL and Reduce care partner burden.  Patient progressing toward long term goals..  Continue plan of care.  OT Short Term Goals Week 1:  OT Short Term Goal 1 (Week 1): Pt will tolerate EOB >5 min to demo improved pain and tolerance ot upright with MIN A for sitting balance OT Short Term Goal 1 - Progress (Week 1): Met OT Short Term Goal 2 (Week 1): Pt will groom with setup OT Short Term Goal 2 - Progress (Week 1): Met OT Short Term Goal 3 (Week 1): Pt will initate OOB transfer training in prep for [facility] transfer OT Short Term Goal 3 - Progress (Week 1): Met OT Short Term Goal 4 (Week 1): Pt will complete 3/4 steps of donning shirt OT Short Term Goal 4 - Progress (Week 1): Met Week 2:  OT Short Term Goal 1 (Week 2): Pt will tolerate  getting OOB 1x daily in prep for OOB toileting OT Short Term Goal 2 (Week 2): Pt will pt will don shirt with CGA OT Short Term Goal 3 (Week 2): pt will roll with MIN A in prep for bed level dressing  Skilled Therapeutic Interventions/Progress Updates:     Attempted to see pt for AM session however just fell asleep and had a terrible night. Educated family on potential POC change to 15/7 starting next week based on tolerance over the weekend and to try to get pt back into the chair and go outside/to panera to try to get a change of scenery.  Pt received in bed with unrated pain. Premedicated.   Therapeutic activity Focus of session on pain management and deep breathing. Educated on mechanics of breathing, HR implications with inhales/exhales, use of extended exhales to improve calming effect. OT guides pt to feel movements of the breathe, count current cadence of breathe and guiding to longer exhales. Pt reporting old breath strategy of inhale, hold for 10 seconds and exhale would make her "tighten up" but this strategy allowed her to relax. Discussed implementation prior to tx, in the middle of painful tx, and in prep for sleep.  Pt left at end of session in bed with exit alarm on, call light in reach and all needs met   Therapy Documentation Precautions:  Precautions Precautions: Fall Precaution Comments: R knee can flex past 45 degrees now, "Bledsoe discontinued.per [name], PA.  No written order in the chart. Required Braces  or Orthoses: Cervical Brace Knee Immobilizer - Right: Other (comment) Cervical Brace: Soft collar, At all times Splint/Cast: R wrist splint on at all times Restrictions Weight Bearing Restrictions: Yes RUE Weight Bearing: Non weight bearing LUE Weight Bearing: Non weight bearing RLE Weight Bearing: Non weight bearing LLE Weight Bearing: Weight bearing as tolerated Other Position/Activity Restrictions: [name] PA note as of 12/26: R UEx: unrestricted motion of  fingers, shoulder and elbow   L UEx: gentle elbow motion as tolerated. No active extension against resistance. Digit, forearm, shoulder motion as tolerated   R LEx: start gentle knee ROM 0-45 degrees.  No active knee extension against resistance x 6 weeks.  Unrestricted hip and ankle motion. Hinged brace only on when mobilizing  L LEx: unrestricted motion of hip, knee and ankle. WBAT transfers only  Therapy/Group: Individual Therapy  Tonny Branch 06/29/2022, 6:54 AM

## 2022-06-29 NOTE — Progress Notes (Signed)
Reviewed pelvic CT wth pt and father. Did not see any acute findings to explain symptoms in feet. Official reading in line with what I saw as well. Pt did better with some relaxation techniques provided by OT. Would be helpful to get out of the room a bit as well. Encouraged her to stay awake as much as she can during the day today so that we can try to re-establish normal sleep-wake cycle.   From a pain standpoint, adjustments were made this morning. Dad/pt aware. This weekend if pain continues/worsens, we could consider trying lyrica in place of gabapentin and perhaps a steroid burst. Ortho is also following at a distance.    Meredith Staggers, MD, [place] Director Rehabilitation Services 06/29/2022
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
## 2022-06-29 NOTE — Progress Notes (Signed)
Pt keeps c/o pain in bilateral lower legs not relief with current scheduled/prn pain medications.Pt was up most of the night weeping. This nurse tried non pharmacological pain methods but pt still c/o pain and cannot go to sleep.

## 2022-06-30 MED ORDER — IBUPROFEN 400 MG PO TABS
800.0000 mg | ORAL_TABLET | Freq: Three times a day (TID) | ORAL | Status: DC
  Administered 2022-06-30 – 2022-07-01 (×2): 800 mg via ORAL
  Filled 2022-06-30 (×2): qty 2

## 2022-06-30 MED ORDER — FLEET ENEMA 7-19 GM/118ML RE ENEM: 1.0000 | ENEMA | Freq: Every day | RECTAL | Status: DC | PRN

## 2022-06-30 NOTE — Progress Notes (Signed)
Occupational Therapy Session Note  Patient Details  Name: Alyssa Lopez MRN: [id number] Date of Birth: 2000/11/21  Today's Date: 06/30/2022 OT Individual Time: 1300-1350 OT Individual Time Calculation (min): 50 min (5 min make up time)   Short Term Goals: Week 2:  OT Short Term Goal 1 (Week 2): Pt will tolerate getting OOB 1x daily in prep for OOB toileting OT Short Term Goal 2 (Week 2): Pt will pt will don shirt with CGA OT Short Term Goal 3 (Week 2): pt will roll with MIN A in prep for bed level dressing  Skilled Therapeutic Interventions/Progress Updates:    Pt received in bed "feeling good" occasional report of burning pain in feet.    Therapeutic activity Pt educated on breathework activity to improve presence, stimulate parasympathetic nervous system, attention and reduce anxiety: take five  Take 5 breathes tracing index finger along alternate palm- inhale trace finger up, exhale trace finger back to starting point. With next breathe move on to next finger.  Goal of session to use breathework while completing mobility to decrease dizziness and pain. Pt able to roll in B direction with MOD A hooking in B directions to don pants and use slide sheet under hips/hamstrings. Pt +2 for sup>sit through long sitting with Les elevated on trashcan. Slideboard transfer using maxi slide sheet scross board with +13for scooting and 3rd person to hold the board steady. Pt tolerated well but did not participate in any of the scooting. Total A transport to outside courtyard for mood support. Pt reporting feeling much better being outside in the sunshine. Hips needed adjusting 2x to scoot back in chair. Instructed mom if pt up in chair to not leave slide sheet under her as it can allow er to slowly slide forward.   Pt left at end of session in bed with exit alarm on, call light in reach and all needs met   Therapy Documentation Precautions:  Precautions Precautions: Fall Precaution Comments: R knee  can flex past 45 degrees now, "Bledsoe discontinued.per [name], PA.  No written order in the chart. Required Braces or Orthoses: Cervical Brace Knee Immobilizer - Right: Other (comment) Cervical Brace: Soft collar, At all times Splint/Cast: R wrist splint on at all times Restrictions Weight Bearing Restrictions: Yes RUE Weight Bearing: Non weight bearing LUE Weight Bearing: Non weight bearing RLE Weight Bearing: Non weight bearing LLE Weight Bearing: Weight bearing as tolerated Other Position/Activity Restrictions: [name] PA note as of 12/26: R UEx: unrestricted motion of fingers, shoulder and elbow   L UEx: gentle elbow motion as tolerated. No active extension against resistance. Digit, forearm, shoulder motion as tolerated   R LEx: start gentle knee ROM 0-45 degrees.  No active knee extension against resistance x 6 weeks.  Unrestricted hip and ankle motion. Hinged brace only on when mobilizing  L LEx: unrestricted motion of hip, knee and ankle. WBAT transfers only General:     Therapy/Group: Individual Therapy  Tonny Branch 06/30/2022, 6:32 AM
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
## 2022-06-30 NOTE — Plan of Care (Signed)
  Problem: Consults Goal: RH GENERAL PATIENT EDUCATION Description: See Patient Education module for education specifics. Outcome: Progressing Goal: Skin Care Protocol Initiated - if Braden Score 18 or less Description: If consults are not indicated, leave blank or document N/A Outcome: Progressing   Problem: RH BOWEL ELIMINATION Goal: RH STG MANAGE BOWEL WITH ASSISTANCE Description: STG Manage Bowel with mod Assistance. Outcome: Progressing Goal: RH STG MANAGE BOWEL W/MEDICATION W/ASSISTANCE Description: STG Manage Bowel with Medication with min Assistance. Outcome: Progressing   Problem: RH SKIN INTEGRITY Goal: RH STG SKIN FREE OF INFECTION/BREAKDOWN Description: Skin/incisions will be free of any infection/breakdown with min assist Outcome: Progressing Goal: RH STG MAINTAIN SKIN INTEGRITY WITH ASSISTANCE Description: STG Maintain Skin Integrity With min Assistance. Outcome: Progressing Goal: RH STG ABLE TO PERFORM INCISION/WOUND CARE W/ASSISTANCE Description: STG Able To Perform Incision/Wound Care With mod Assistance. Outcome: Progressing   Problem: RH SAFETY Goal: RH STG ADHERE TO SAFETY PRECAUTIONS W/ASSISTANCE/DEVICE Description: STG Adhere to Safety Precautions With cueing Assistance/Device. Outcome: Progressing   Problem: RH PAIN MANAGEMENT Goal: RH STG PAIN MANAGED AT OR BELOW PT'S PAIN GOAL Description: Pain will be managed at 4 out of 10 on pain scale with PRN medications min assist Outcome: Progressing   Problem: RH KNOWLEDGE DEFICIT GENERAL Goal: RH STG INCREASE KNOWLEDGE OF SELF CARE AFTER HOSPITALIZATION Description: Patient/caregiver will be able to manage medications, wounds, and self care max/total assist @ wheelchair level from nursing education and nursing handouts independently  Outcome: Progressing

## 2022-07-01 DIAGNOSIS — M79672 Pain in left foot: Secondary | ICD-10-CM

## 2022-07-01 DIAGNOSIS — M79671 Pain in right foot: Secondary | ICD-10-CM

## 2022-07-01 DIAGNOSIS — M545 Low back pain, unspecified: Secondary | ICD-10-CM

## 2022-07-01 MED ORDER — TAPENTADOL HCL 50 MG PO TABS
100.0000 mg | ORAL_TABLET | ORAL | Status: DC | PRN
  Administered 2022-07-01 – 2022-07-12 (×33): 100 mg via ORAL
  Filled 2022-07-01 (×35): qty 2

## 2022-07-01 MED ORDER — TAPENTADOL HCL ER 100 MG PO TB12: 100.0000 mg | ORAL_TABLET | Freq: Two times a day (BID) | ORAL | Status: DC

## 2022-07-01 MED ORDER — IBUPROFEN 400 MG PO TABS
800.0000 mg | ORAL_TABLET | Freq: Four times a day (QID) | ORAL | Status: DC
  Administered 2022-07-01 – 2022-07-02 (×4): 800 mg via ORAL
  Filled 2022-07-01 (×4): qty 2

## 2022-07-01 MED ORDER — OXYCODONE HCL ER 20 MG PO T12A
20.0000 mg | EXTENDED_RELEASE_TABLET | Freq: Two times a day (BID) | ORAL | Status: DC
  Administered 2022-07-01 – 2022-07-12 (×22): 20 mg via ORAL
  Filled 2022-07-01 (×22): qty 1

## 2022-07-01 MED ORDER — TAPENTADOL HCL 50 MG PO TABS
100.0000 mg | ORAL_TABLET | Freq: Four times a day (QID) | ORAL | Status: DC | PRN
  Administered 2022-07-01: 100 mg via ORAL
  Filled 2022-07-01: qty 2

## 2022-07-01 MED ORDER — LIDOCAINE 5 % EX PTCH
2.0000 | MEDICATED_PATCH | CUTANEOUS | Status: DC
  Administered 2022-07-02 – 2022-07-12 (×11): 2 via TRANSDERMAL
  Filled 2022-07-01 (×11): qty 2

## 2022-07-01 NOTE — Plan of Care (Signed)
  Problem: Consults Goal: RH GENERAL PATIENT EDUCATION Description: See Patient Education module for education specifics. Outcome: Progressing Goal: Skin Care Protocol Initiated - if Braden Score 18 or less Description: If consults are not indicated, leave blank or document N/A Outcome: Progressing   Problem: RH BOWEL ELIMINATION Goal: RH STG MANAGE BOWEL WITH ASSISTANCE Description: STG Manage Bowel with mod Assistance. Outcome: Progressing Goal: RH STG MANAGE BOWEL W/MEDICATION W/ASSISTANCE Description: STG Manage Bowel with Medication with min Assistance. Outcome: Progressing   Problem: RH SKIN INTEGRITY Goal: RH STG SKIN FREE OF INFECTION/BREAKDOWN Description: Skin/incisions will be free of any infection/breakdown with min assist Outcome: Progressing Goal: RH STG MAINTAIN SKIN INTEGRITY WITH ASSISTANCE Description: STG Maintain Skin Integrity With min Assistance. Outcome: Progressing Goal: RH STG ABLE TO PERFORM INCISION/WOUND CARE W/ASSISTANCE Description: STG Able To Perform Incision/Wound Care With mod Assistance. Outcome: Progressing   Problem: RH SAFETY Goal: RH STG ADHERE TO SAFETY PRECAUTIONS W/ASSISTANCE/DEVICE Description: STG Adhere to Safety Precautions With cueing Assistance/Device. Outcome: Progressing   Problem: RH PAIN MANAGEMENT Goal: RH STG PAIN MANAGED AT OR BELOW PT'S PAIN GOAL Description: Pain will be managed at 4 out of 10 on pain scale with PRN medications min assist Outcome: Progressing   Problem: RH KNOWLEDGE DEFICIT GENERAL Goal: RH STG INCREASE KNOWLEDGE OF SELF CARE AFTER HOSPITALIZATION Description: Patient/caregiver will be able to manage medications, wounds, and self care max/total assist @ wheelchair level from nursing education and nursing handouts independently  Outcome: Progressing   

## 2022-07-01 NOTE — Plan of Care (Signed)
  Problem: RH Furniture Transfers Goal: LTG Patient will perform furniture transfers w/assist (OT/PT) Description: LTG: Patient will perform furniture transfers  with assistance (OT/PT). Outcome: Not Progressing Flowsheets (Taken 07/01/2022 [phone]) LTG: Pt will perform furniture transfers with assist:: (D/c goal due to patient's elevated pain and anxiety levels limiting activity tolerance and progress with functional mobility.) --   Problem: RH Balance Goal: LTG Patient will maintain dynamic sitting balance (PT) Description: LTG:  Patient will maintain dynamic sitting balance with assistance during mobility activities (PT) Flowsheets (Taken 07/01/2022 0623) LTG: Pt will maintain dynamic sitting balance during mobility activities with:: (Downgraded goals due to patient's elevated pain and anxiety levels limiting activity tolerance and progress with functional mobility.) Supervision/Verbal cueing Note: Downgraded goals due to patient's elevated pain and anxiety levels limiting activity tolerance and progress with functional mobility.    Problem: RH Bed Mobility Goal: LTG Patient will perform bed mobility with assist (PT) Description: LTG: Patient will perform bed mobility with assistance, with/without cues (PT). Flowsheets (Taken 07/01/2022 [phone]) LTG: Pt will perform bed mobility with assistance level of: (Downgraded goals due to patient's elevated pain and anxiety levels limiting activity tolerance and progress with functional mobility.) Minimal Assistance - Patient > 75% Note: Downgraded goals due to patient's elevated pain and anxiety levels limiting activity tolerance and progress with functional mobility.    Problem: RH Bed to Chair Transfers Goal: LTG Patient will perform bed/chair transfers w/assist (PT) Description: LTG: Patient will perform bed to chair transfers with assistance (PT). Flowsheets (Taken 07/01/2022 [phone]) LTG: Pt will perform Bed to Chair Transfers with assistance level:  (Downgraded goals due to patient's elevated pain and anxiety levels limiting activity tolerance and progress with functional mobility.) Moderate Assistance - Patient 50 - 74% Note: Downgraded goals due to patient's elevated pain and anxiety levels limiting activity tolerance and progress with functional mobility.    Problem: RH Car Transfers Goal: LTG Patient will perform car transfers with assist (PT) Description: LTG: Patient will perform car transfers with assistance (PT). Flowsheets (Taken 07/01/[phone]) LTG: Pt will perform car transfers with assist:: 2 Helpers   Problem: RH Pre-functional/Other (Specify) Goal: RH LTG PT (Specify) 1 Description:  RH LTG PT (Specify) 1 Flowsheets (Taken 07/01/[phone]) LTG: Other PT (Specify) 1: Patient and caregivers will be independent with HEP for strengthening/ROM per ortho recommendations.

## 2022-07-01 NOTE — Progress Notes (Signed)
Physical Therapy Weekly Progress Note  Patient Details  Name: Alyssa Lopez MRN: [id number] Date of Birth: 06-26-2000  Beginning of progress report period: June 23, 2022 End of progress report period: July 01, 2022  Patient has met 2 of 4 short term goals.  Patient with slow, but steady progress this week. Patient is currently very limited by pain and fear of movement. Initiated coping strategies and pain management strategies throughout the week with some improvement in patient response. Patient currently requires max-total A +2 for bed mobility and transfers via slide board. B knee ROM improved, but significantly limited at this time, will continue to progress per ortho recs. Family has been present every session and participated in observation and intermittent hands on training/education during session. Will schedule formal hands on training closer to d/c.   Patient continues to demonstrate the following deficits muscle weakness and muscle joint tightness, decreased cardiorespiratoy endurance, and decreased sitting balance, decreased standing balance, decreased postural control, and decreased balance strategies and therefore will continue to benefit from skilled PT intervention to increase functional independence with mobility.  Patient not progressing toward long term goals.  See goal revision..  Plan of care revisions: downgraded goals to min-mod A for bed mobility and transfers, +2 car transfer, and independent with HEP with caregiver assist.  PT Short Term Goals Week 1:  PT Short Term Goal 1 (Week 1): Pt will roll side to side w/ 1 peron assist and mod A PT Short Term Goal 1 - Progress (Week 1): Progressing toward goal PT Short Term Goal 2 (Week 1): Pt will transfer sup to sit w/ max A w/ +2 PT Short Term Goal 2 - Progress (Week 1): Met PT Short Term Goal 3 (Week 1): Pt will transfer bed<> w/c/recliner w/ max A + 2. PT Short Term Goal 3 - Progress (Week 1): Progressing toward  goal PT Short Term Goal 4 (Week 1): Pt will tolerate sitting EOB w/ supervision x 10 min. PT Short Term Goal 4 - Progress (Week 1): Met Week 2:  PT Short Term Goal 1 (Week 2): STG=LTG due to ELOS.  Therapy Documentation Precautions:  Precautions Precautions: Fall Precaution Comments: R knee can flex past 45 degrees now, "Bledsoe discontinued.per [name], PA.  No written order in the chart. Required Braces or Orthoses: Cervical Brace Knee Immobilizer - Right: Other (comment) Cervical Brace: Soft collar, At all times Splint/Cast: R wrist splint on at all times Restrictions Weight Bearing Restrictions: Yes RUE Weight Bearing: Non weight bearing LUE Weight Bearing: Non weight bearing RLE Weight Bearing: Non weight bearing LLE Weight Bearing: Weight bearing as tolerated Other Position/Activity Restrictions: [name] PA note as of 12/26: R UEx: unrestricted motion of fingers, shoulder and elbow   L UEx: gentle elbow motion as tolerated. No active extension against resistance. Digit, forearm, shoulder motion as tolerated   R LEx: start gentle knee ROM 0-45 degrees.  No active knee extension against resistance x 6 weeks.  Unrestricted hip and ankle motion. Hinged brace only on when mobilizing  L LEx: unrestricted motion of hip, knee and ankle. WBAT transfers only   Therapy/Group: Individual Therapy   L  PT, DPT, NCS, CBIS  07/01/2022, 6:19 AM
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
## 2022-07-02 LAB — COMPREHENSIVE METABOLIC PANEL
ALT: 29 U/L (ref 0–44)
AST: 25 U/L (ref 15–41)
Albumin: 3.2 g/dL — ABNORMAL LOW (ref 3.5–5.0)
Alkaline Phosphatase: 260 U/L — ABNORMAL HIGH (ref 38–126)
Anion gap: 7 (ref 5–15)
BUN: 15 mg/dL (ref 6–20)
CO2: 25 mmol/L (ref 22–32)
Calcium: 10 mg/dL (ref 8.9–10.3)
Chloride: 104 mmol/L (ref 98–111)
Creatinine, Ser: 0.7 mg/dL (ref 0.44–1.00)
GFR, Estimated: >60 mL/min (ref >60)
Glucose, Bld: 91 mg/dL (ref 70–99)
Potassium: 4 mmol/L (ref 3.5–5.1)
Sodium: 136 mmol/L (ref 135–145)
Total Bilirubin: 0.5 mg/dL (ref 0.3–1.2)
Total Protein: 6.3 g/dL — ABNORMAL LOW (ref 6.5–8.1)

## 2022-07-02 LAB — CBC
HCT: 32.3 % — ABNORMAL LOW (ref 36.0–46.0)
Hemoglobin: 10.4 g/dL — ABNORMAL LOW (ref 12.0–15.0)
MCH: 30.2 pg (ref 26.0–34.0)
MCHC: 32.2 g/dL (ref 30.0–36.0)
MCV: 93.9 fL (ref 80.0–100.0)
Platelets: 181 10*3/uL (ref 150–400)
RBC: 3.44 MIL/uL — ABNORMAL LOW (ref 3.87–5.11)
RDW: 14.9 % (ref 11.5–15.5)
WBC: 6.8 10*3/uL (ref 4.0–10.5)
nRBC: 0 % (ref 0.0–0.2)

## 2022-07-02 MED ORDER — GABAPENTIN 300 MG PO CAPS
300.0000 mg | ORAL_CAPSULE | Freq: Three times a day (TID) | ORAL | Status: DC
  Administered 2022-07-02 – 2022-07-05 (×9): 300 mg via ORAL
  Filled 2022-07-02 (×9): qty 1

## 2022-07-02 MED ORDER — PREGABALIN 50 MG PO CAPS
100.0000 mg | ORAL_CAPSULE | Freq: Three times a day (TID) | ORAL | Status: DC
  Administered 2022-07-02 – 2022-07-05 (×10): 100 mg via ORAL
  Filled 2022-07-02 (×10): qty 2

## 2022-07-02 MED ORDER — IBUPROFEN 400 MG PO TABS
800.0000 mg | ORAL_TABLET | Freq: Three times a day (TID) | ORAL | Status: DC
  Administered 2022-07-02 – 2022-07-12 (×30): 800 mg via ORAL
  Filled 2022-07-02 (×31): qty 2

## 2022-07-02 NOTE — Progress Notes (Signed)
Occupational Therapy Session Note  Patient Details  Name: Alyssa Lopez MRN: [id number] Date of Birth: 08-13-2000  Today's Date: 07/02/2022 OT Individual Time: 4818-5631 OT Individual Time Calculation (min): 60 min    Short Term Goals: Week 2:  OT Short Term Goal 1 (Week 2): Pt will tolerate getting OOB 1x daily in prep for OOB toileting OT Short Term Goal 2 (Week 2): Pt will pt will don shirt with CGA OT Short Term Goal 3 (Week 2): pt will roll with MIN A in prep for bed level dressing  Skilled Therapeutic Interventions/Progress Updates:    Patient agreeable to participate in OT session. Reports a burning sensation in bilateral feet; constant. Monitored during session.   Patient participated in skilled OT session focusing on Manual techniques, passive and active ROM to the left elbow/UE. Session completed at bed level for patient's comfort level. Therapist educated patient on elbow HEP in order to improve overall active ROM and functional use of the left arm during self care tasks.  - Moist heat applied to left elbow for 5' prior to myofascial release and passive stretching.  - Myofascial release and progress passive stretching completed to left elbow in order to decrease fascial restrictions and increase joint mobility in a pain free zone.  - P/ROM; elbow flexion/extension, 10X. - AA/ROM; supine, utilizing dowel rod; completed elbow extension into elbow flexion then elbow extension while completing chest press. Held each position for 2-3 seconds. 10X. - Focused on active elbow flexion/extension while utilizing resistive clothespins while in chair position in bed. Using left hand, pt picked up clothespin and performed elbow flexion attempting to tap the back of her left shoulder then extending arm focusing on elbow extension to place clothespin on either vertical or horizontal bar.  - Pt education provided to Mom and patient regarding elbow HEP including Active elbow flexion/extension  utilizing a pvc pipe. Handout provided with verbal instruction. Pt and mother verbalized understanding.   Encouraged use of ice pack at end of session for elbow for pain management as she will more than likely experience some pain/soreness from session. Pt verbalized understanding although did not want any ice at this time.     Therapy Documentation Precautions:  Precautions Precautions: Fall Precaution Comments: R knee can flex past 45 degrees now, "Bledsoe discontinued.per [name], PA.  No written order in the chart. Required Braces or Orthoses: Cervical Brace Knee Immobilizer - Right: Other (comment) Cervical Brace: Soft collar, At all times Splint/Cast: R wrist splint on at all times Restrictions Weight Bearing Restrictions: Yes RUE Weight Bearing: Non weight bearing (ok to WB through elbow only) LUE Weight Bearing: Non weight bearing RLE Weight Bearing: Non weight bearing (ok to WBAT for transfers only) LLE Weight Bearing: Non weight bearing (OK to WBAT for t/f only) Other Position/Activity Restrictions: RUE and LLE: No ROM restrictions. LUE: Gentle elbow ROM. No ROM restrictions to shoulder, wrist, hand. RLE: Gentle ROM knee 0-45 degrees. No active knee extension against resistrance. No ROM restrictions with hip and ankle.  Therapy/Group: Individual Therapy  Ailene Ravel, OTR/L,CBIS  Supplemental OT - [facility] and [facility] Secure Chat Preferred   07/02/2022, 12:56 PM

## 2022-07-02 NOTE — Progress Notes (Signed)
Occupational Therapy Session Note  Patient Details  Name: Alyssa Lopez MRN: [id number] Date of Birth: [birth year]  Today's Date: 07/02/2022 OT Individual Time: 4098-1191 OT Individual Time Calculation (min): 45 min    Short Term Goals: Week 2:  OT Short Term Goal 1 (Week 2): Pt will tolerate getting OOB 1x daily in prep for OOB toileting OT Short Term Goal 2 (Week 2): Pt will pt will don shirt with CGA OT Short Term Goal 3 (Week 2): pt will roll with MIN A in prep for bed level dressing  Skilled Therapeutic Interventions/Progress Updates:  Pt greeted supine in bed, pt agreeable to OT intervention. Session focus on BADL reeducation and decreasing overall caregiver burden.          Pt requested to work on grooming from bed level, able to complete oral care  with set- up assist  with use of built up handle on toothbrush.  Pt requested to wash underneath R wrist brace d/t dry skin. Doffed brace with total A with education provided on how to don/ doff. Pt washed RUE with set- up assist.   Changed brief from bed level with pt able to roll R<>L with MODA. Changed brief with total A with pt able void bladder using female urinal with total A for set- up of urinal. Pt completed UB bathing with MIN A with pt able to transition into long sitting with MOD A and able to sustain unsupported sitting with MINA. Donned [place] shirt with MINA.   Ended session with pt semi reclined in bed with all needs within reach and bed alarm activated.                    Therapy Documentation Precautions:  Precautions Precautions: Fall Precaution Comments: R knee can flex past 45 degrees now, "Bledsoe discontinued.per [name], PA.  No written order in the chart. Required Braces or Orthoses: Cervical Brace Knee Immobilizer - Right: Other (comment) Cervical Brace: Soft collar, At all times Splint/Cast: R wrist splint on at all times Restrictions Weight Bearing Restrictions: Yes RUE Weight Bearing: Non weight  bearing LUE Weight Bearing: Non weight bearing RLE Weight Bearing: Non weight bearing LLE Weight Bearing: Weight bearing as tolerated Other Position/Activity Restrictions: [name] PA note as of 12/26: R UEx: unrestricted motion of fingers, shoulder and elbow   L UEx: gentle elbow motion as tolerated. No active extension against resistance. Digit, forearm, shoulder motion as tolerated   R LEx: start gentle knee ROM 0-45 degrees.  No active knee extension against resistance x 6 weeks.  Unrestricted hip and ankle motion. Hinged brace only on when mobilizing  L LEx: unrestricted motion of hip, knee and ankle. WBAT transfers only   Pain: 5/10 pain reported from supine with an increase to 8/10 when touching BLEs, rest breaks, breathing strategies and repositioning provided as needed.     Therapy/Group: Individual Therapy  Corinne Ports [facility] 07/02/2022, 12:03 PM

## 2022-07-02 NOTE — Progress Notes (Signed)
Physical Therapy Session Note  Patient Details  Name: Alyssa Lopez MRN: [id number] Date of Birth: 2001/06/07  Today's Date: 07/02/2022 PT Individual Time: 3419-3790 PT Individual Time Calculation (min): 69 min   Short Term Goals: Week 2:  PT Short Term Goal 1 (Week 2): STG=LTG due to ELOS.  Skilled Therapeutic Interventions/Progress Updates:     Pt received supine in bed and agrees to therapy. Reports tingling pain in both feet, as well as increased pain in L thigh. PT provides repositioning and rest breaks to manage pain. RN present assisting pt with lower body dressing. Pt performs bilateral rolling with minA/modA to assist with pulling up pants. Pt then performs supine to sit with cues for body mechanics, logrolling, and sequencing. MaxA overall required. Pt performs slideboard transfer to the R from slightly elevated bed to [facility] with maxi slide to decrease friction, and maxA with cues for anterior trunk lean to unweight buttocks for transfer. WC transport to gym. Pt performs slideboard transfer to the L with maxA and similar cues. Pt remains in short sitting on edge of mat for extended period of time to adjust to upright positioning and allow gravity to provide passive flexion of bilateral knees. PT provides distraction via conversation with pt to limit pain provocation and increase tolerance to sitting. PT then places platform under pt's L foot and has pt flex forward at trunk, shifting weight anteriorly and increasing loading through L foot to acclimate L lower extremity to WB, as pt is very anxious about pain provocation.  Pt transitions to supine with modA management of hips and lower extremities. Pt performs x15 heel slides with both legs to increase ROM at knees work on motor planning. Pt completes x15 glute sets and quad sets with cues for correct performance.  Supine to sit from flat mat with supervision and cues for sequencing and positioning. MaxA for slideboard from mat>WC>bed, and  modA for return to supine. Left with alarm intact and all needs within reach  Therapy Documentation Precautions:  Precautions Precautions: Fall Precaution Comments: R knee can flex past 45 degrees now, "Bledsoe discontinued.per [name], PA.  No written order in the chart. Required Braces or Orthoses: Cervical Brace Knee Immobilizer - Right: Other (comment) Cervical Brace: Soft collar, At all times Splint/Cast: R wrist splint on at all times Restrictions Weight Bearing Restrictions: Yes RUE Weight Bearing: Non weight bearing (ok to WB through elbow only) LUE Weight Bearing: Non weight bearing RLE Weight Bearing: Non weight bearing (ok to WBAT for transfers only) LLE Weight Bearing: Non weight bearing (OK to WBAT for t/f only) Other Position/Activity Restrictions: RUE and LLE: No ROM restrictions. LUE: Gentle elbow ROM. No ROM restrictions to shoulder, wrist, hand. RLE: Gentle ROM knee 0-45 degrees. No active knee extension against resistrance. No ROM restrictions with hip and ankle.   Therapy/Group: Individual Therapy  Breck Coons, PT, DPT 07/02/2022, 4:49 PM
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
## 2022-07-02 NOTE — Patient Instructions (Signed)
  Straighten you elbows as much as possible. Bend elbows bringing the dowel/PVC pipe towards your chin. If laying down: Press dowel/PVC pipe up towards ceiling If seated: Press dowel/PVC pipe out away from your chest.   Reverse steps...that is one repetition. Try to complete 10 repetitions.

## 2022-07-03 NOTE — Progress Notes (Signed)
Patient ID: Alyssa Lopez, female   DOB: January 29, 2001, 22 y.o.   MRN: [id number]  SW met with pt and pt mother in room to provide updates from team conference, and discharge date remains 1/25. SW received FMLA forms to be completed for employer. Mother received letter from physician. SW discussed family education scheduled with parents for Friday (1/19), Monday (1/22) and Tuesday 9am-12pm with her parents. SW shared will cancel therapy if not needed. SW will confirm DME recs.  SW left FMLA forms for PA-Dan to complete.   Loralee Pacas, MSW, [place] Office: [phone] Cell: [phone] Fax: [phone]
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
## 2022-07-03 NOTE — Progress Notes (Signed)
Physical Therapy Session Note  Patient Details  Name: Alyssa Lopez MRN: [id number] Date of Birth: 15-Aug-2000  Today's Date: 07/03/2022 PT Individual Time: 1116-1200 and 1435-1520 PT Individual Time Calculation (min): 44 min and 45 min.  Short Term Goals: Week 2:  PT Short Term Goal 1 (Week 2): STG=LTG due to ELOS.  Skilled Therapeutic Interventions/Progress Updates:   First session:  Pt presents supine in bed asleep and requiring mod encouragement to wake up and participate w/ therapy.  PT threaded scrub shorts over feet and then pt able to pull up to bottom.  Pt rolled side to side w/ min A and verbal cues for knee flexion to assist for pulling shorts over hips as well as placing maxi slide pad under hips.  Pt transfers sup to long sitting w/ supervision and then brings LE s to EOB, but does still require mod A and verbal cues to scoot to EOB.  Pt able to lean to Left and assist w/ lifting R LE for placement of slide board.  Pt required A + 2 for slide board transfer into w/c, w/ mom holding SB steady.  Pt able to assist w/ weight shifting in w/c for removal of pad and then to scoot back into w/c.  Pt wheeled to main gym and performed seated reaching outside of BOS for cones and placing to opposite side.  Pt reaching to left w/ L hand and stacking cones w/ RUE and then back.  Pt not c/o pain w/ activity.  Towel padding to B foot rests.  Pt returned to room and remained sitting in w/c.  Pt required multiple scoot back into w/c.  Pillow placed behind for decreased post pelvic tilt.  Mom present in room for lunch.   Second session:  Pt presents supine in bed asleep and arouses but very fatigued.  Pt remained in w/c for lunch and OT treatment, just transferred to bed ~ 15 minutes ago.  Pt agreeable to LE there ex.  Pt performed QS, GS 3 x 10, AP w/ slow stretch HC, pt c/o burning, but decreased, AAROM abd/add and HS.  Pt tolerated AROM and then overpressure w/ flexon of 50 * LLE, 75* RLE.  Pt  remained in bed asleep at conclusion of therapy.     Therapy Documentation Precautions:  Precautions Precautions: Fall Precaution Comments: R knee can flex past 45 degrees now, "Bledsoe discontinued.per [name], PA.  No written order in the chart. Required Braces or Orthoses: Cervical Brace Knee Immobilizer - Right: Other (comment) Cervical Brace: Soft collar, At all times Splint/Cast: R wrist splint on at all times Restrictions Weight Bearing Restrictions: Yes RUE Weight Bearing: Weight bearing as tolerated LUE Weight Bearing: Non weight bearing RLE Weight Bearing: Weight bearing as tolerated LLE Weight Bearing: Weight bearing as tolerated Other Position/Activity Restrictions: RUE and LLE: No ROM restrictions. LUE: Gentle elbow ROM. No ROM restrictions to shoulder, wrist, hand. RLE: Gentle ROM knee 0-45 degrees. No active knee extension against resistrance. No ROM restrictions with hip and ankle. General:   Vital Signs:  Pain:        Therapy/Group: Individual Therapy  Ladoris Gene 07/03/2022, 1:05 PM
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
## 2022-07-03 NOTE — Progress Notes (Signed)
Occupational Therapy Session Note  Patient Details  Name: Alyssa Lopez MRN: [id number] Date of Birth: December 31, 2000  Today's Date: 07/03/2022 OT Individual Time: 0805-0900 OT Individual Time Calculation (min): 55 min    Short Term Goals: Week 1:  OT Short Term Goal 1 (Week 1): Pt will tolerate EOB >5 min to demo improved pain and tolerance ot upright with MIN A for sitting balance OT Short Term Goal 1 - Progress (Week 1): Met OT Short Term Goal 2 (Week 1): Pt will groom with setup OT Short Term Goal 2 - Progress (Week 1): Met OT Short Term Goal 3 (Week 1): Pt will initate OOB transfer training in prep for [facility] transfer OT Short Term Goal 3 - Progress (Week 1): Met OT Short Term Goal 4 (Week 1): Pt will complete 3/4 steps of donning shirt OT Short Term Goal 4 - Progress (Week 1): Met  Skilled Therapeutic Interventions/Progress Updates:    Session 1: Pt received in bed with dad presetn already received pain medication and Ot instructed pt to begin deep breathing techinques to clam nervous system before moving. Intermittent rest provided as well as heat pack at end of session with night gown between skin.   ADL: Pt completes ADL at overall S-s/u for UB/grooming at EOB Level. Skilled interventions include: bed pan used for toileting d/t efficiency. Rolling with MOD A for peri care total A. Pt completes sup>long sitting with S!! And then requires MOD A to manage Les to EOB with slow lowering to trash can. Pt washes UB and grooms with S at EOB. Pt able to doff/don gown today.   Therapeutic activity Pt and OT practice scooting forward and backward to EOB with slide sheet underneath with MAX A for movement of hips . Pt demo difficulty with pelvic dissociation to fully scoot forward. EOB>supine with improved participation of managing Les into bed with guarding of trunk by +2   Pt left at end of session in bed with exit alarm on, call light in reach and all needs met  Session 2: 1300-1403 63  min Pt received in w/c with unrated pain in B knees . Medcation and repositioning provided for pain relief throughout session. Communicated change in WB status and in 10 days pt would be cleared to initiate walking.  ADL: Pt agreeable to hair washing with hair washin tray. Total A for washing hair and wash cloth wrapped to hairbrush to allow pt for improved independnce with brushing hair. Pt pouts when handed the hair brush but able to brush 75% of it. Toileting at end of session with MIN A for rolling without need to hook with 2nd person, A only to manage LE.  Therapeutic activity Pt taken to 4N tower to interact with old nursing staff. Pt states, "in 10 days I am cleared to start walking." Pt seems to be happy to have reconnected with care staff from before and in a positive mood. With step under BLE, pt able ot completes MOD A+2 SB transfer to EOB with feet able to be supported on 3" instep. Lateral scoot to [facility] with MAX A of 1 with use of yoga block under RUE. MOD A+2 for sit>sup.  Pt left at end of session in bed with exit alarm on, call light in reach and all needs met   Therapy Documentation Precautions:  Precautions Precautions: Fall Precaution Comments: R knee can flex past 45 degrees now, "Bledsoe discontinued.per [name], PA.  No written order in the chart. Required Braces  or Orthoses: Cervical Brace Knee Immobilizer - Right: Other (comment) Cervical Brace: Soft collar, At all times Splint/Cast: R wrist splint on at all times Restrictions Weight Bearing Restrictions: Yes RUE Weight Bearing: Weight bearing as tolerated LUE Weight Bearing: Non weight bearing RLE Weight Bearing: Weight bearing as tolerated LLE Weight Bearing: Weight bearing as tolerated Other Position/Activity Restrictions: RUE and LLE: No ROM restrictions. LUE: Gentle elbow ROM. No ROM restrictions to shoulder, wrist, hand. RLE: Gentle ROM knee 0-45 degrees. No active knee extension against resistrance. No ROM  restrictions with hip and ankle.   Therapy/Group: Individual Therapy  Tonny Branch 07/03/2022, 6:50 AM

## 2022-07-04 ENCOUNTER — Ambulatory Visit: Payer: BC Managed Care – PPO | Admitting: Psychology

## 2022-07-04 NOTE — Progress Notes (Unsigned)
                 , LCSW °

## 2022-07-04 NOTE — Progress Notes (Signed)
aOccupational Therapy Session Note  Patient Details  Name: Alyssa Lopez MRN: [id number] Date of Birth: 2000/07/16  Today's Date: 07/04/2022 OT Individual Time: 1005-1100 OT Individual Time Calculation (min): 55 min   Today's Date: 07/04/2022 OT Individual Time: 9480-1655 OT Individual Time Calculation (min): 45 min   Short Term Goals: Week 1:  OT Short Term Goal 1 (Week 1): Pt will tolerate EOB >5 min to demo improved pain and tolerance ot upright with MIN A for sitting balance OT Short Term Goal 1 - Progress (Week 1): Met OT Short Term Goal 2 (Week 1): Pt will groom with setup OT Short Term Goal 2 - Progress (Week 1): Met OT Short Term Goal 3 (Week 1): Pt will initate OOB transfer training in prep for [facility] transfer OT Short Term Goal 3 - Progress (Week 1): Met OT Short Term Goal 4 (Week 1): Pt will complete 3/4 steps of donning shirt OT Short Term Goal 4 - Progress (Week 1): Met  Skilled Therapeutic Interventions/Progress Updates:    Session 1: Pt received in bed with unrated B knee pain Meds during session provided for pain relief  ADL: Pt completes ADL at overall EOB Level. Skilled interventions include: +1 person to sup>sit EOB with MOD A to manage Les with cuing for anterior weight shift in trunk to keep balance. Changing shirt with set up and grooming with setup. Pt provided with pain medication during session but then becoming tearful reporting increased pain. Sit >sup with +1 A and then focus on discussion with pt on goals of care. Pt guided through conversation and able to ID 3 goals before DC  Toileting on BSC Showering Putting weight onto Les/prepping to stand.   Pt left at end of session in bed with exit alarm on, call light in reach and all needs met  Session 2:  Pt received in bed with unrated, premedicated pain with other medications as well administered for nerve pain/muscle relaxer at beginning of session. Ice knees before and after session with mother present   ADL: Pt reporting need to toilet and agreeable to use BSC. Pt rolls with Min A of 1 to doff pants total A and place slide sheet underneath hips/thighs. Mother stabilizes board/BSC while OT provides total A to slide across SB with slide sheet. OT reliant on hospital bed features to provide down hill set up in both directions and 3 inch step under her feet. Pt able to wipe peri area after bladder, and OT able to cleanse buttocks after small BM. Slide sheet placed under bottom to slide back to bed with ypga block under R elbow. Pt still unable ot tolerate >65* knee flexion. While EOB with B knees flexed, pt completes reaching in all planes in mod ranges outside BOS for weight shifting into BLE. Pt needs mac encouragement/support and OT has pt name 3 things she could do this week she couldn't last week to combat catastrophizing thoughts of "ill never walk again" and "I hate my feet they wont ever work."  Pt left at end of session in bed with exit alarm on, call light in reach and all needs met    Therapy Documentation Precautions:  Precautions Precautions: Fall Precaution Comments: R knee can flex past 45 degrees now, "Bledsoe discontinued.per [name], PA.  No written order in the chart. Required Braces or Orthoses: Cervical Brace Knee Immobilizer - Right: Other (comment) Cervical Brace: Soft collar, At all times Splint/Cast: R wrist splint on at all times Restrictions Weight Bearing  Restrictions: Yes RUE Weight Bearing: Weight bearing as tolerated LUE Weight Bearing: Non weight bearing RLE Weight Bearing: Weight bearing as tolerated LLE Weight Bearing: Weight bearing as tolerated Other Position/Activity Restrictions: RUE and LLE: No ROM restrictions. LUE: Gentle elbow ROM. No ROM restrictions to shoulder, wrist, hand. RLE: Gentle ROM knee 0-45 degrees. No active knee extension against resistrance. No ROM restrictions with hip and ankle. General:    Therapy/Group: Individual  Therapy  Tonny Branch 07/04/2022, 6:51 AM

## 2022-07-04 NOTE — Progress Notes (Signed)
Physical Therapy Session Note  Patient Details  Name: Alyssa Lopez MRN: [id number] Date of Birth: 11/06/2000  Today's Date: 07/04/2022 PT Individual Time: 9323-5573 PT Individual Time Calculation (min): 71 min   Short Term Goals: Week 2:  PT Short Term Goal 1 (Week 2): STG=LTG due to ELOS.  Skilled Therapeutic Interventions/Progress Updates:     Pt received semi reclined in bed and agrees to therapy. Reports pain in both feet, tingling in nature. PT provides repositioning and rest breaks to manage pain. Pt performs bilateral rolling with minA to assist with donning pants in supine. Pt performs supine to sit with modA and cues for logrolling and sequencing. Slideboard transfer to the R with maxA and cues for anterior trunk lean and weight shifting. WC transport to gym for time management. Pt performs slideboard transfer to mat with maxA and same cues. Sit to supine with cues for positioning. PT utilize bed sheet to slide pt dependently onto mat to prepare for transfer onto tilt table. Pt performs lateral supine slide onto tilt table with use of sheet and dependent +2 assistance. Pt performs graded weight bearing through bilateral lower extremities on tilt table to acclimate body to loading against gravity, strengthening, and activity tolerance. Pt slowly brought to 30 degrees, progressing to 40, 50, and 60 degrees, with pt remaining at each angle for extended time to adjust to positioning and allow pain to ease in lower extremities. Pt verbalizes that tingling in feet improves with weight bearing but pain in knees and thighs increases. Pt takes several rest breaks at angles closer to horizontal to decrease WB through legs. Pt appears to tolerate activity well, and is able to flex knees slightly with cueing and return to knees-extended position without physical assistance.  Supine slide transfer back to mat with dependent +2 assistance. Supine to sit without assistance, with cues for positioning.  Slideboard transfer from mat>WC>Bed with maxA and same cues. Left supine with all needs within reach.  Therapy Documentation Precautions:  Precautions Precautions: Fall Precaution Comments: R knee can flex past 45 degrees now, "Bledsoe discontinued.per [name], PA.  No written order in the chart. Required Braces or Orthoses: Cervical Brace Knee Immobilizer - Right: Other (comment) Cervical Brace: Soft collar, At all times Splint/Cast: R wrist splint on at all times Restrictions Weight Bearing Restrictions: Yes RUE Weight Bearing: Weight bearing as tolerated LUE Weight Bearing: Non weight bearing RLE Weight Bearing: Weight bearing as tolerated LLE Weight Bearing: Weight bearing as tolerated Other Position/Activity Restrictions: RUE and LLE: No ROM restrictions. LUE: Gentle elbow ROM. No ROM restrictions to shoulder, wrist, hand. RLE: Gentle ROM knee 0-45 degrees. No active knee extension against resistrance. No ROM restrictions with hip and ankle.   Therapy/Group: Individual Therapy  Breck Coons, PT, DPT 07/04/2022, 5:15 PM

## 2022-07-04 NOTE — Progress Notes (Signed)
Physical Therapy Session Note  Patient Details  Name: Alyssa Lopez MRN: [id number] Date of Birth: 10-01-2000  Today's Date: 07/04/2022 PT Individual Time: 1829-9371 PT Individual Time Calculation (min): 40 min   Short Term Goals: Week 2:  PT Short Term Goal 1 (Week 2): STG=LTG due to ELOS.  Skilled Therapeutic Interventions/Progress Updates:    Chart reviewed and pt agreeable to therapy. Pt received semi-reclined in bed with c/o pain BLE with movement, but not quantified during session. Session focused on functional transfers to promote mobility for home access. Pt initiated session with transfer to EOB with MinA + bed features. Pt then required MaxA with forward scooting. Pt set up for R slide board transfer with maxislide. Pt made 2 attempts to shift along slideboard, but required MaxA to [facility] for sliding. Pt began to c/o pain in RLE increasing and nausea. Pt returned to bed with MaxA +2. At end of session, pt was left in bed-in-chair position with alarm engaged, nurse call bell and all needs in reach.     Therapy Documentation Precautions:  Precautions Precautions: Fall Precaution Comments: R knee can flex past 45 degrees now, "Bledsoe discontinued.per [name], PA.  No written order in the chart. Required Braces or Orthoses: Cervical Brace Knee Immobilizer - Right: Other (comment) Cervical Brace: Soft collar, At all times Splint/Cast: R wrist splint on at all times Restrictions Weight Bearing Restrictions: Yes RUE Weight Bearing: Weight bearing as tolerated LUE Weight Bearing: Non weight bearing RLE Weight Bearing: Weight bearing as tolerated LLE Weight Bearing: Weight bearing as tolerated Other Position/Activity Restrictions: RUE and LLE: No ROM restrictions. LUE: Gentle elbow ROM. No ROM restrictions to shoulder, wrist, hand. RLE: Gentle ROM knee 0-45 degrees. No active knee extension against resistrance. No ROM restrictions with hip and ankle.     Therapy/Group:  Individual Therapy  Marquette Old, PT, DPT 07/04/2022, 11:56 AM
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
## 2022-07-05 MED ORDER — PREGABALIN 75 MG PO CAPS
150.0000 mg | ORAL_CAPSULE | Freq: Three times a day (TID) | ORAL | Status: DC
  Administered 2022-07-05 – 2022-07-12 (×21): 150 mg via ORAL
  Filled 2022-07-05 (×21): qty 2

## 2022-07-05 NOTE — Progress Notes (Signed)
Physical Therapy Session Note  Patient Details  Name: Alyssa Lopez MRN: [id number] Date of Birth: 2001-05-04  Today's Date: 07/05/2022 PT Individual Time: 9485-4627 and 1400-1425 PT Individual Time Calculation (min): 72 min and 25 min  Short Term Goals: Week 2:  PT Short Term Goal 1 (Week 2): STG=LTG due to ELOS.  Skilled Therapeutic Interventions/Progress Updates:     1st Session: Pt received semi reclined in bed and agrees to therapy. Reports pain in both feet and "all over". PT provides rest breaks and repositioning as needed to manage pain. Pt perform supine to sit with cues for body mechanics and sequencing. Pt performs reciprocal scooting toward edge of bed with cues for lateral leaning to unweight contralateral buttock. Pt requires minA/modA for seated scooting due to fatigue. Pt performs slideaboard transfer to [facility] with maxA and multimodal cues for anterior trunk lean and initiation, as well as sequencing. WC transport to gym. PT demonstrates squat pivot transfer and pt is agreeable to attempting. Pt then completes squat pivot to mat with maxA and cues for initiation, body mechanics, and sequencing. Following brief seated rest break, pt completes x2 additional transfers.  Pt takes extended rest break in supine secondary to pain in bilateral lower extremities as well as fatigue. Supine to sit with maxA for energy conservation and pain management. Pt encouraged to attempt sit to stand transfer and educated on importance of WB through legs. Pt is eventually agreeable. Pt performs x2 reps of sit to stand with modA/maxA and manual facilitation of hip extension and anterior weight shift. Pt able to maintain standing ~20-30 seconds prior to requiring seated rest breaks.  Squat pivot back to WC with maxA and same cues. Pt encouraged to remain sitting out of bed for core strengthening and endurance. Pt left seated in WC with legs elevated and all needs within reach.  2nd Session: Pt received  seated in [facility] and agrees to therapy. Reports pain in bilateral lower extremities. PT provides rest breaks and repositioning to manage pain. Pt performs squat/stand pivot transfer from [facility] to bed with maxA and cues for initiation, body mechanics, and sequencing. ModA for sit to supine with management of bilateral lower extremities. Pt performs therex in semi-reclined position for strengthening and increasing ROM of bilateral lower extremities. Pt completes following 1x15: Ankle pumps, quad sets, glute sets, heel slides, SAQs, and SLRs. PT provides verbal and tactile cues for optimal performance and AAROM for SLRs. Pt left supine with all needs within reach.  Therapy Documentation Precautions:  Precautions Precautions: Fall Precaution Comments: R knee can flex past 45 degrees now, "Bledsoe discontinued.per [name], PA.  No written order in the chart. Required Braces or Orthoses: Cervical Brace Knee Immobilizer - Right: Other (comment) Cervical Brace: Soft collar, At all times Splint/Cast: R wrist splint on at all times Restrictions Weight Bearing Restrictions: Yes RUE Weight Bearing: Weight bearing as tolerated LUE Weight Bearing: Non weight bearing RLE Weight Bearing: Weight bearing as tolerated LLE Weight Bearing: Weight bearing as tolerated Other Position/Activity Restrictions: RUE and LLE: No ROM restrictions. LUE: Gentle elbow ROM. No ROM restrictions to shoulder, wrist, hand. RLE: Gentle ROM knee 0-45 degrees. No active knee extension against resistrance. No ROM restrictions with hip and ankle.   Therapy/Group: Individual Therapy  Breck Coons, PT, DPT 07/05/2022, 5:28 PM
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
## 2022-07-05 NOTE — Progress Notes (Signed)
Occupational Therapy Session Note  Patient Details  Name: Alyssa Lopez MRN: [id number] Date of Birth: 10/20/2000  Today's Date: 07/05/2022 OT Individual Time: 2376-2831 OT Individual Time Calculation (min): 57 min    Short Term Goals: Week 2:  OT Short Term Goal 1 (Week 2): Pt will tolerate getting OOB 1x daily in prep for OOB toileting OT Short Term Goal 2 (Week 2): Pt will pt will don shirt with CGA OT Short Term Goal 3 (Week 2): pt will roll with MIN A in prep for bed level dressing  Skilled Therapeutic Interventions/Progress Updates:  Pt greeted asleep in supine, pt reports fatigue but agreeable to OT intervention. Session focus on BUE therex from bed level  to increase AROM. Pt completed below therex:   - first applied moist heat to L elbow to increase blood flow for AROM, while heat donned pt utilized reacher from long sitting in bed to retrieve items on tray table to facilitate improved RUE [facility] as well as practice with reacher to be used for LB dressing tasks, pt completed task with CGA, MIN cues needed for technique with reacher. Pt also able to reach dynamically from long sitting to retrieve bean bags with BUEs to challenge BUE AROM and activate core as task required pt shift trunk anteriorly from bed, pt completed task with supervision.  -   doffed moist heat from elbow after 5 mins and worked on [employer] in L elbow. First, completed full PROM elbow flex/ext with an emphasis on coordinating breath with movement. After PROM pt able to achieve 120* of active elbow extension and 95* of active elbow flexion - utilized gait belt with BUEs looped inside belt to work on through [employer] via elbow flexion/extension, shoulder flexion, and scapular protraction/retraction, education provided on using belt as needed to help facilitate improved ROM.   Ended session with pt supine in bed with all needs within reach and bed alarm activated.                b Therapy Documentation Precautions:   Precautions Precautions: Fall Precaution Comments: R knee can flex past 45 degrees now, "Bledsoe discontinued.per [name], PA.  No written order in the chart. Required Braces or Orthoses: Cervical Brace Knee Immobilizer - Right: Other (comment) Cervical Brace: Soft collar, At all times Splint/Cast: R wrist splint on at all times Restrictions Weight Bearing Restrictions: Yes RUE Weight Bearing: Weight bearing as tolerated LUE Weight Bearing: Non weight bearing RLE Weight Bearing: Weight bearing as tolerated LLE Weight Bearing: Weight bearing as tolerated Other Position/Activity Restrictions: RUE and LLE: No ROM restrictions. LUE: Gentle elbow ROM. No ROM restrictions to shoulder, wrist, hand. RLE: Gentle ROM knee 0-45 degrees. No active knee extension against resistrance. No ROM restrictions with hip and ankle.  Pain: Unrated pain reported "all over," rest breaks, repositioning, and premedication provided as pain mgmt strategies.    Therapy/Group: Individual Therapy  Precious Haws 07/05/2022, 3:40 PM

## 2022-07-05 NOTE — Progress Notes (Signed)
Occupational Therapy Session Note  Patient Details  Name: JACKLINE CASTILLA MRN: [id number] Date of Birth: 04/15/2001  Today's Date: 07/05/2022 OT Individual Time: 0800-0900 OT Individual Time Calculation (min): 60 min    Short Term Goals: Week 1:  OT Short Term Goal 1 (Week 1): Pt will tolerate EOB >5 min to demo improved pain and tolerance ot upright with MIN A for sitting balance OT Short Term Goal 1 - Progress (Week 1): Met OT Short Term Goal 2 (Week 1): Pt will groom with setup OT Short Term Goal 2 - Progress (Week 1): Met OT Short Term Goal 3 (Week 1): Pt will initate OOB transfer training in prep for [facility] transfer OT Short Term Goal 3 - Progress (Week 1): Met OT Short Term Goal 4 (Week 1): Pt will complete 3/4 steps of donning shirt OT Short Term Goal 4 - Progress (Week 1): Met  Skilled Therapeutic Interventions/Progress Updates:    Session 1: Pt received in bed with 8 out of 10 pain in B knees. Pain medication, instructon on extended exhale breathing and ice provided for pain relief  ADL: Pt completes ADL at overall EOB/SIT TO STAND LEVEL (total A +2 from elevated bed) Level. Skilled interventions include: MAX encouragement to get EOB with MOD +1 A, edu re on strategies to use the reacher with MOD A to thread BLE into pants and MAX motivation and encouragement for 3 musketeers stand from significantly elevated EOB with pt in semi stand/perch position ~20 seconds.   Salli Quarry in background to help with motivation, distraction and engagement.  Pt left at end of session in bed with exit alarm on, call light in reach and all needs met   Therapy Documentation Precautions:  Precautions Precautions: Fall Precaution Comments: R knee can flex past 45 degrees now, "Bledsoe discontinued.per [name], PA.  No written order in the chart. Required Braces or Orthoses: Cervical Brace Knee Immobilizer - Right: Other (comment) Cervical Brace: Soft collar, At all times Splint/Cast: R  wrist splint on at all times Restrictions Weight Bearing Restrictions: Yes RUE Weight Bearing: Weight bearing as tolerated LUE Weight Bearing: Non weight bearing RLE Weight Bearing: Weight bearing as tolerated LLE Weight Bearing: Weight bearing as tolerated Other Position/Activity Restrictions: RUE and LLE: No ROM restrictions. LUE: Gentle elbow ROM. No ROM restrictions to shoulder, wrist, hand. RLE: Gentle ROM knee 0-45 degrees. No active knee extension against resistrance. No ROM restrictions with hip and ankle. General:   Therapy/Group: Individual Therapy  Tonny Branch 07/05/2022, 6:59 AM

## 2022-07-05 NOTE — Progress Notes (Signed)
Occupational Therapy Note  Patient Details  Name: Alyssa Lopez MRN: [id number] Date of Birth: 2000-11-04  Pt will require bariatric DAC to decrease burden of care, increase safety and increase independence with ease of slide board transfers/toileting components at DC.  Lowella Dell  07/05/2022, 10:34 AM

## 2022-07-06 MED ORDER — NORTRIPTYLINE HCL 25 MG PO CAPS
25.0000 mg | ORAL_CAPSULE | Freq: Every day | ORAL | Status: DC
  Administered 2022-07-06 – 2022-07-08 (×3): 25 mg via ORAL
  Filled 2022-07-06 (×3): qty 1

## 2022-07-06 MED ORDER — CLONAZEPAM 0.5 MG PO TABS
0.5000 mg | ORAL_TABLET | Freq: Two times a day (BID) | ORAL | Status: DC
  Administered 2022-07-06 – 2022-07-12 (×12): 0.5 mg via ORAL
  Filled 2022-07-06 (×12): qty 1

## 2022-07-06 MED ORDER — CLONAZEPAM 0.25 MG PO TBDP: 0.5000 mg | ORAL_TABLET | Freq: Two times a day (BID) | ORAL | Status: DC
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
## 2022-07-06 NOTE — Progress Notes (Signed)
Patient ID: Alyssa Lopez, female   DOB: September 29, 2000, 22 y.o.   MRN: [id number]  SW provided pt with handicap placard.   Loralee Pacas, MSW, [place] Office: [phone] Cell: [phone] Fax: [phone]

## 2022-07-06 NOTE — Progress Notes (Signed)
Physical Therapy Session Note  Patient Details  Name: Alyssa Lopez MRN: [id number] Date of Birth: 04/06/2001  Today's Date: 07/06/2022 PT Individual Time: 1105-1200 and 1500-1529 PT Individual Time Calculation (min): 55 min and 29 min  Short Term Goals: Week 2:  PT Short Term Goal 1 (Week 2): STG=LTG due to ELOS.  Skilled Therapeutic Interventions/Progress Updates:  Community reintegration;Neuromuscular re-education;DME/adaptive equipment instruction;UE/LE Strength taining/ROM;Discharge planning;Pain management;Therapeutic Activities;UE/LE Coordination activities;Functional mobility training;Patient/family education;Therapeutic Exercise   1st Session: Pt received seated in [facility] and agrees to therapy. Reports pain in bilateral feet and legs. PT provides education, repositioning, and rest breaks to manage pain. Mother and father present for family education. PT provides update on pt's progress with therapy and recommendations for safe mobility following discharge. WC transport to gym for time management. PT demonstrates slideboard transfer with use of bilateral lower extremities to assist with scooting hips, to increase pt independence with transfer. Pt then performs slideaboard from [facility] to mat with maxA and cues for weight shifting, hand placement, sequencing, and positioning. Pt able to perform reciprocal scooting in sitting to position hips more safely on mat. Pt performs sit to stand with maxA and manual facilitation of hip extension, anterior weight shifting, and PT providing light cueing at knees to facilitate extension. Pt able to remain standing ~20 seconds prior to requiring seated rest break. Following extended seated rest break, pt stands again with same assistance and achieves more upright posture and able to stand ~30 seconds.   Pt performs stand pivot back to [facility] with maxA and same cues. Handed off to parents in [facility].   2nd Session: Pt received seated in [facility] and agrees to therapy. Reports  pain is "not that bad". PT provides repositioning and rest breaks to manage pain. Pt performs stand pivot transfer to bed with modA/maxA and cues for anterior weight shift, initiation, sequencing, and positioning. ModA management of bilateral lower extremities for sit to supine. Pt performs therex for strengthening and ROM of bilateral lower extremities. PT provides verbal and tactile cues for neuromuscular feedback and correct performance. Pt completes 1x15 ankle pumps, quad sets, glute sets, heel slides, SAQs, and SLRs (AAROM). Pt left semi-reclined with all needs within reach.  Therapy Documentation Precautions:  Precautions Precautions: Fall Precaution Comments: R knee can flex past 45 degrees now, "Bledsoe discontinued.per [name], PA.  No written order in the chart. Required Braces or Orthoses: Cervical Brace Knee Immobilizer - Right: Other (comment) Cervical Brace: Soft collar, At all times Splint/Cast: R wrist splint on at all times Restrictions Weight Bearing Restrictions: Yes RUE Weight Bearing: Weight bearing as tolerated LUE Weight Bearing: Non weight bearing RLE Weight Bearing: Weight bearing as tolerated LLE Weight Bearing: Weight bearing as tolerated Other Position/Activity Restrictions: RUE and LLE: No ROM restrictions. LUE: Gentle elbow ROM. No ROM restrictions to shoulder, wrist, hand. RLE: Gentle ROM knee 0-45 degrees. No active knee extension against resistrance. No ROM restrictions with hip and ankle.   Therapy/Group: Individual Therapy  Breck Coons, PT ,DPT 07/06/2022, 4:52 PM

## 2022-07-06 NOTE — Progress Notes (Signed)
Occupational Therapy Session Note  Patient Details  Name: Alyssa Lopez MRN: [id number] Date of Birth: August 24, 2000  Today's Date: 07/06/2022 OT Individual Time: 1005-1100 OT Individual Time Calculation (min): 55 min   Today's Date: 07/06/2022 OT Individual Time: 1300-1350 OT Individual Time Calculation (min): 50 min  Short Term Goals: Week 1:  OT Short Term Goal 1 (Week 1): Pt will tolerate EOB >5 min to demo improved pain and tolerance ot upright with MIN A for sitting balance OT Short Term Goal 1 - Progress (Week 1): Met OT Short Term Goal 2 (Week 1): Pt will groom with setup OT Short Term Goal 2 - Progress (Week 1): Met OT Short Term Goal 3 (Week 1): Pt will initate OOB transfer training in prep for [facility] transfer OT Short Term Goal 3 - Progress (Week 1): Met OT Short Term Goal 4 (Week 1): Pt will complete 3/4 steps of donning shirt OT Short Term Goal 4 - Progress (Week 1): Met  Skilled Therapeutic Interventions/Progress Updates:    Session 1: focus on family education, practice on hands on bed mobility and demo/assistance with trasnfer training.Pt received in bed with 8 out of 10 pain in B knees/feet. Premedicated and rest provided for pain relief Therapeutic activity Mother instructed on strategies for bed mobility and provides min-mod A for supine<>sit throughout session. OT educated on SB transfers use of chuck pad or slide sheet to make toileting easier with gown. Pt needs MAX A of 1 for SB transfer but +2 steadying equipment. Improved weight shift forward this date. Pt returns back to bed to don pants after bladder and BM. Pt able ot complete peri are on commode. Pt then needs MAX encouragement to get back up and transfer via squat pivot via total A with poor weight acceptance onto BLE needing L knee blocking and A to scoot back from EOC. Pt left at end of session in bed with exit alarm on, call light in reach and all needs met  Session 2: Pt received in w/c with 8 out of 10 pain  in B knees. Medication at beginning of session provided for pain relief  Therapeutic activity + BADL Pt agreeable to OT. Focus of session on Massed practice of squat pivot transfers with MAX  of 1 and second person present to stabilize chair. Pt with improved pelvic dissociation/hip walking forward and backwards on mat. May benefit from trialing A/P transfer for toileting. Discussed gratitude and with foam handle on pen pt able ot write 1 thing she was thankful for to put up on the gratitude tree. Then edu re doffing/donning socks with reacher and sock aide. Pt able to complete with min cuing. With Les dangling!!  Pt left at end of session in bed with exit alarm on, call light in reach and all needs met   Therapy Documentation Precautions:  Precautions Precautions: Fall Precaution Comments: R knee can flex past 45 degrees now, "Bledsoe discontinued.per [name], PA.  No written order in the chart. Required Braces or Orthoses: Cervical Brace Knee Immobilizer - Right: Other (comment) Cervical Brace: Soft collar, At all times Splint/Cast: R wrist splint on at all times Restrictions Weight Bearing Restrictions: Yes RUE Weight Bearing: Weight bearing as tolerated LUE Weight Bearing: Non weight bearing RLE Weight Bearing: Weight bearing as tolerated LLE Weight Bearing: Weight bearing as tolerated Other Position/Activity Restrictions: RUE and LLE: No ROM restrictions. LUE: Gentle elbow ROM. No ROM restrictions to shoulder, wrist, hand. RLE: Gentle ROM knee 0-45 degrees. No active  knee extension against resistrance. No ROM restrictions with hip and ankle. General:    Therapy/Group: Individual Therapy  Tonny Branch 07/06/2022, 6:54 AM

## 2022-07-06 NOTE — Progress Notes (Incomplete)
Occupational Therapy Weekly Progress Note  Patient Details  Name: Alyssa Lopez MRN: [id number] Date of Birth: 15-Feb-2001  Beginning of progress report period: {Time; dates multiple:304500300} End of progress report period: {Time; dates multiple:304500300}  {CHL IP REHAB OT TIME CALCULATIONS:304400400}   Patient has met {number 1-5:22450} of {number 1-5:20334} short term goals.  ***  Patient continues to demonstrate the following deficits: {impairments:3041632} and therefore will continue to benefit from skilled OT intervention to enhance overall performance with {ADL/iADL:3041649}.  Patient {LTG progression:3041653}.  {plan of WCHE:5277824}  OT Short Term Goals {OT MPN:3614431}  Skilled Therapeutic Interventions/Progress Updates:      Therapy Documentation Precautions:  Precautions Precautions: Fall Precaution Comments: R knee can flex past 45 degrees now, "Bledsoe discontinued.per [name], PA.  No written order in the chart. Required Braces or Orthoses: Cervical Brace Knee Immobilizer - Right: Other (comment) Cervical Brace: Soft collar, At all times Splint/Cast: R wrist splint on at all times Restrictions Weight Bearing Restrictions: Yes RUE Weight Bearing: Weight bearing as tolerated LUE Weight Bearing: Non weight bearing RLE Weight Bearing: Weight bearing as tolerated LLE Weight Bearing: Weight bearing as tolerated Other Position/Activity Restrictions: RUE and LLE: No ROM restrictions. LUE: Gentle elbow ROM. No ROM restrictions to shoulder, wrist, hand. RLE: Gentle ROM knee 0-45 degrees. No active knee extension against resistrance. No ROM restrictions with hip and ankle. General:   Vital Signs: Therapy Vitals Temp: 98.5 F (36.9 C) Temp Source: Oral Pulse Rate: 97 Resp: 15 BP: 125/70 Patient Position (if appropriate): Sitting Oxygen Therapy SpO2: 99 % O2 Device: Room Air Pain:   ADL:   Vision   Perception    Praxis   Exercises:   Other  Treatments:     Therapy/Group: {Therapy/Group:3049007}  Tonny Branch 07/06/2022, 3:17 PM

## 2022-07-07 ENCOUNTER — Inpatient Hospital Stay (HOSPITAL_COMMUNITY): Payer: BC Managed Care – PPO
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
## 2022-07-07 NOTE — Progress Notes (Signed)
Occupational Therapy Weekly Progress Note  Patient Details  Name: Alyssa Lopez MRN: [id number] Date of Birth: 2001-02-07  Beginning of progress report period: June 29, 2022 End of progress report period: July 07, 2022  Today's Date: 07/07/2022 OT Individual Time: 1000-1100 OT Individual Time Calculation (min): 60 min   Session 2: make up time 1345-1425 45 min   Patient has met 3 of 3 short term goals.  Pt has made excellent progress this reporting period improving in tolerance/participation, willingness to try new things, and self soothing/coping with pain. Pt currently is supervision for UB ADLs, MAX A for LB ADLs, MOD-MAX A for squat pivot transfers/Stand pivot transfers. She can completes supine>sit with supervision and roll with MIN A which signifiantly decreases her BOC.   Patient continues to demonstrate the following deficits: muscle weakness, decreased cardiorespiratoy endurance, and decreased sitting balance, decreased standing balance, decreased postural control, decreased balance strategies, and difficulty maintaining precautions and therefore will continue to benefit from skilled OT intervention to enhance overall performance with BADL and Reduce care partner burden.  Patient progressing toward long term goals..  Continue plan of care.  OT Short Term Goals Week 2:  OT Short Term Goal 1 (Week 2): Pt will tolerate getting OOB 1x daily in prep for OOB toileting OT Short Term Goal 1 - Progress (Week 2): Met OT Short Term Goal 2 (Week 2): Pt will pt will don shirt with CGA OT Short Term Goal 2 - Progress (Week 2): Met OT Short Term Goal 3 (Week 2): pt will roll with MIN A in prep for bed level dressing OT Short Term Goal 3 - Progress (Week 2): Met Week 3:  OT Short Term Goal 1 (Week 3): STG=LTG d/t ELOS  Skilled Therapeutic Interventions/Progress Updates:    Pt received in bed with burning in feet. Meds already provided for pain relief. Breathing strategies reinforced  and pt able to use PRN with only min cuing  ADL: Pt completes posterior scoot transfer with MOD A with use of chuck pad and elevated bed to scoot back onto the w/c, pt able to void on BSC and cleanse front peri area, A to cleanse buttocks. Pt rolls with MIN A to don pants and needs CGA for fully scooting to EOB after getting pants on. Supervision to don socks with sock aide and MOD A +2 squat pivot transfer into chair. 1 sit to stand from w/c with MOD A +2 and good effort from pt to stand. Poor terminal hip extension, but able ot maintain WB ~20 seconds. Pt reporting, "it didn't feel as bad as last time."  Pt left at end of session in w/c with exit alarm on, call light in reach and all needs met. Mother to bring pt down to gym to play on wii for mood support  Session 2: pt received in bed asleep but agreeable to toileting OOB. Pt able to complete sup>sit>scoot to EOB with supervision! Then heavy MOD A squat pivot transfer to/from the [facility] with sister steadying commode for safety. Sit to stand 2x for clothing management with heavy MOD A and facilitation of anterior weight shift. Pt able to void on commode and cleanse peri area. OT wipes buttocks and dons brief while seated on commode with bucket removed. Pt requires MIN Ato return to supine an assists with scooting to [facility] minimally through small bridge of hips. Pt provided with ice for R knee and roller to massage L thigh. Exited session with pt seated in bed, exit  alarm on and call light in reach    Therapy Documentation Precautions:  Precautions Precautions: Fall Precaution Comments: R knee can flex past 45 degrees now, "Bledsoe discontinued.per [name], PA.  No written order in the chart. Required Braces or Orthoses: Cervical Brace Knee Immobilizer - Right: Other (comment) Cervical Brace: Soft collar, At all times Splint/Cast: R wrist splint on at all times Restrictions Weight Bearing Restrictions: Yes RUE Weight Bearing: Weight bearing as  tolerated LUE Weight Bearing: Non weight bearing RLE Weight Bearing: Weight bearing as tolerated LLE Weight Bearing: Weight bearing as tolerated Other Position/Activity Restrictions: RUE and LLE: No ROM restrictions. LUE: Gentle elbow ROM. No ROM restrictions to shoulder, wrist, hand. RLE: Gentle ROM knee 0-45 degrees. No active knee extension against resistrance. No ROM restrictions with hip and ankle. General:     Therapy/Group: Individual Therapy  Tonny Branch 07/07/2022, 5:58 AM

## 2022-07-08 DIAGNOSIS — S92301D Fracture of unspecified metatarsal bone(s), right foot, subsequent encounter for fracture with routine healing: Secondary | ICD-10-CM

## 2022-07-08 NOTE — Progress Notes (Signed)
Physical Therapy Session Note  Patient Details  Name: Alyssa Lopez MRN: [id number] Date of Birth: 14-Sep-2000  Today's Date: 07/08/2022 PT Individual Time: 1505-6979 PT Individual Time Calculation (min): 69 min   Short Term Goals: Week 2:  PT Short Term Goal 1 (Week 2): STG=LTG due to ELOS.  Skilled Therapeutic Interventions/Progress Updates:      Therapy Documentation Precautions:  Precautions Precautions: Fall Precaution Comments: R knee can flex past 45 degrees now, "Bledsoe discontinued.per [name], PA.  No written order in the chart. Required Braces or Orthoses: Cervical Brace Knee Immobilizer - Right: Other (comment) Cervical Brace: Soft collar, At all times Splint/Cast: R wrist splint on at all times Restrictions Weight Bearing Restrictions: Yes RUE Weight Bearing: Weight bearing as tolerated LUE Weight Bearing: Non weight bearing RLE Weight Bearing: Weight bearing as tolerated LLE Weight Bearing: Weight bearing as tolerated Other Position/Activity Restrictions: RUE and LLE: No ROM restrictions. LUE: Gentle elbow ROM. No ROM restrictions to shoulder, wrist, hand. RLE: Gentle ROM knee 0-45 degrees. No active knee extension against resistrance. No ROM restrictions with hip and ankle.    Pt agreeable to PT session with emphasis on transfer training. Pt with unrated generalized pain throughout session. PT provided pt with cryotherapy, rest breaks and repositioning for relief.   Pt requests need to toilet and required supervision and increased time for supine>long sitting>sit edge of bed. Pt used right elbow to weight bear through yoga block to assist with anterior scooting.   Pt requires max A with squat pivot to DABSC and with STS while additional assist removed brief/pants. Pt continent of bowel and bladder and dependent for peri-care in standing performed by additional assist. Pt max A with return squat pivot to bed and provided with rest break for pain management.    Pt transitioned to blocked practice of sit to stand transfers x 4 with PT assist anterior to patient. PT guided pt in mental imagery of the beach and pt receptive and benefited from technique. Pt benefits from music therapy and progressed to standing ~1 min. Initially pt mod A for transfers, faded to min with repetition.   Pt left semi-reclined in bed with all needs in reach and alarm on.   Therapy/Group: Individual Therapy  Verl Dicker Verl Dicker PT, DPT  07/08/2022, 12:47 PM
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
## 2022-07-08 NOTE — Progress Notes (Signed)
Orthopedic Tech Progress Note Patient Details:  Alyssa Lopez 2000/07/23 [id number]  Correct size PO shoe placed at bedside for use when OOB.  Ortho Devices Type of Ortho Device: Postop shoe/boot Ortho Device/Splint Location: For RLE,at bedside Ortho Device/Splint Interventions: Ordered   Post Interventions Instructions Provided: Care of device, Adjustment of device   Jeri Modena 07/08/2022, 4:27 PM

## 2022-07-09 ENCOUNTER — Ambulatory Visit (INDEPENDENT_AMBULATORY_CARE_PROVIDER_SITE_OTHER): Payer: BC Managed Care – PPO | Admitting: Psychology

## 2022-07-09 DIAGNOSIS — F4322 Adjustment disorder with anxiety: Secondary | ICD-10-CM | POA: Diagnosis not present

## 2022-07-09 LAB — COMPREHENSIVE METABOLIC PANEL
ALT: 24 U/L (ref 0–44)
AST: 23 U/L (ref 15–41)
Albumin: 3 g/dL — ABNORMAL LOW (ref 3.5–5.0)
Alkaline Phosphatase: 212 U/L — ABNORMAL HIGH (ref 38–126)
Anion gap: 9 (ref 5–15)
BUN: 15 mg/dL (ref 6–20)
CO2: 24 mmol/L (ref 22–32)
Calcium: 9.6 mg/dL (ref 8.9–10.3)
Chloride: 103 mmol/L (ref 98–111)
Creatinine, Ser: 0.6 mg/dL (ref 0.44–1.00)
GFR, Estimated: >60 mL/min (ref >60)
Glucose, Bld: 87 mg/dL (ref 70–99)
Potassium: 3.8 mmol/L (ref 3.5–5.1)
Sodium: 136 mmol/L (ref 135–145)
Total Bilirubin: 0.4 mg/dL (ref 0.3–1.2)
Total Protein: 5.8 g/dL — ABNORMAL LOW (ref 6.5–8.1)

## 2022-07-09 LAB — CBC
HCT: 33.3 % — ABNORMAL LOW (ref 36.0–46.0)
Hemoglobin: 10.3 g/dL — ABNORMAL LOW (ref 12.0–15.0)
MCH: 29.8 pg (ref 26.0–34.0)
MCHC: 30.9 g/dL (ref 30.0–36.0)
MCV: 96.2 fL (ref 80.0–100.0)
Platelets: 275 10*3/uL (ref 150–400)
RBC: 3.46 MIL/uL — ABNORMAL LOW (ref 3.87–5.11)
RDW: 14.5 % (ref 11.5–15.5)
WBC: 6.9 10*3/uL (ref 4.0–10.5)
nRBC: 0 % (ref 0.0–0.2)

## 2022-07-09 MED ORDER — NORTRIPTYLINE HCL 25 MG PO CAPS
50.0000 mg | ORAL_CAPSULE | Freq: Every day | ORAL | Status: DC
  Administered 2022-07-09 – 2022-07-11 (×3): 50 mg via ORAL
  Filled 2022-07-09 (×3): qty 2
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
## 2022-07-09 NOTE — Progress Notes (Signed)
Physical Therapy Session Note  Patient Details  Name: Alyssa Lopez MRN: [id number] Date of Birth: May 09, 2001  Today's Date: 07/09/2022 PT Individual Time: 1130-1200 PT Individual Time Calculation (min): 30 min   Short Term Goals: Week 1:  PT Short Term Goal 1 (Week 1): Pt will roll side to side w/ 1 peron assist and mod A PT Short Term Goal 1 - Progress (Week 1): Progressing toward goal PT Short Term Goal 2 (Week 1): Pt will transfer sup to sit w/ max A w/ +2 PT Short Term Goal 2 - Progress (Week 1): Met PT Short Term Goal 3 (Week 1): Pt will transfer bed<> w/c/recliner w/ max A + 2. PT Short Term Goal 3 - Progress (Week 1): Progressing toward goal PT Short Term Goal 4 (Week 1): Pt will tolerate sitting EOB w/ supervision x 10 min. PT Short Term Goal 4 - Progress (Week 1): Met  Skilled Therapeutic Interventions/Progress Updates:      Therapy Documentation Precautions:  Precautions Precautions: Fall Precaution Comments: R knee can flex past 45 degrees now, "Bledsoe discontinued.per [name], PA.  No written order in the chart. Required Braces or Orthoses: Cervical Brace Knee Immobilizer - Right: Other (comment) Cervical Brace: Soft collar, At all times Splint/Cast: R wrist splint on at all times Restrictions Weight Bearing Restrictions: Yes RUE Weight Bearing: Non weight bearing LUE Weight Bearing: Weight bearing as tolerated RLE Weight Bearing: Non weight bearing LLE Weight Bearing: Weight bearing as tolerated Other Position/Activity Restrictions: RUE and LLE: No ROM restrictions. LUE: Gentle elbow ROM. No ROM restrictions to shoulder, wrist, hand. RLE: Gentle ROM knee 0-45 degrees. No active knee extension against resistrance. No ROM restrictions with hip and ankle.   Pt agreeable to PT session with emphasis on LE strength training. Pt with unrated bilateral knee pain, pre-medicated. Pt dependently transferred by w/c to dayroom for time management. Pt performed Kinetron  at 30 cm/sec x 10 minutes to increase workload/heart rate and address bilateral LE strength deficits. Pt transported to room and PT utilized therapeutic use of self to provide support and encouragement to patient regarding recovery. Pt left in w/c at bedside with all needs in reach and mother present.   Therapy/Group: Individual Therapy  Verl Dicker Verl Dicker PT, DPT  07/09/2022, 7:46 AM

## 2022-07-09 NOTE — Progress Notes (Signed)
Occupational Therapy Session Note  Patient Details  Name: Alyssa Lopez MRN: [id number] Date of Birth: 2001/03/25  Today's Date: 07/09/2022 Session 1 OT Individual Time: 0900-1000 OT Individual Time Calculation (min): 60 min   Session 2 OT Individual Time: 1405-1505 OT Individual Time Calculation (min): 60 min    Short Term Goals: Week 3:  OT Short Term Goal 1 (Week 3): STG=LTG d/t ELOS  Skilled Therapeutic Interventions/Progress Updates:  Session 1   Pt greeted semi-reclined in bed with mom and dad present for family education. OT printed out ROM parameters for all extremities and educated on these. OT had mom practice PROM of L elbow at bed level. Educated on LB clothing management in bed prior to toilet transfer. Pt able to come into long sitting, then swing legs towards EOB with supervision. She then needed assist for reciprocal hip scooting towards EOB. Attemptde slideboard transfer, but pt with difficulty with head/hips relationship and keeping feet on the floor. Transitioned to squat-pivot with max A of 1, but second person steadying commode. Educated pt on helping with foot position during transfer. Pt with continent void of bladder. Worked on pt completing her own peri-care after bucket removed. Mom assisted with posterior peri-care with pt leaning all the way to the R onto bed and yoga block to get hip up far enough for toileting. Educated on  sliding brief under patient on BSC. Educated pt's mother on body mechanics for squat-pivot transfers. Used built up step to get LE's on the floor prior to transfer. Mom did a great job with transfer to the R. Pt';s father then assisted with LB dressing and partial sit<>stand, while mom helped pull up pants. Grooming tasks completed from wc at the sink, then handoff to PT for next therapy session.   Session 2 Pt greeted sitting up in wc and agreeable to OT treatment session. Pt brought down to therapy gym in wc. Stand-pivot transfers from wc to  therapy mat with mod A. OT addressed L elbow ROM for most of session. OT placed towel roll under R elbow to encourage more elbow extension. Educated on passive ROM using R UE to help increase extension. Complete ROM with forearm pronated and supinated, then within horizontal plane. Took pt through gravity assisted extension leaning down onto R elbow, then letting L arm hang down to promote extension. Educated on positioning In bed to decrease time in flexed position. OT printed out elbow PROM exercises to use in room. Pt completed stand-pivots back to wc, then back to bed once she returned to room. Max A to transition back into to bed. Pt left semi-reclined in bed with mom present and needs met.   Therapy Documentation Precautions:  Precautions Precautions: Fall Precaution Comments: R knee can flex past 45 degrees now, "Bledsoe discontinued.per [name], PA.  No written order in the chart. Required Braces or Orthoses: Cervical Brace Knee Immobilizer - Right: Other (comment) Cervical Brace: Soft collar, At all times Splint/Cast: R wrist splint on at all times Restrictions Weight Bearing Restrictions: Yes RUE Weight Bearing: Non weight bearing LUE Weight Bearing: Weight bearing as tolerated RLE Weight Bearing: Non weight bearing LLE Weight Bearing: Weight bearing as tolerated Other Position/Activity Restrictions: RUE and LLE: No ROM restrictions. LUE: Gentle elbow ROM. No ROM restrictions to shoulder, wrist, hand. RLE: Gentle ROM knee 0-45 degrees. No active knee extension against resistrance. No ROM restrictions with hip and ankle. Pain: Pain Assessment Pain Scale: 0-10 Pain Score: 7  Pain Type: Acute pain Pain  Location: Foot Pain Descriptors / Indicators: Aching Pain Frequency: Constant Pain Onset: On-going Patients Stated Pain Goal: 4 Pain Intervention(s): Repositioned    Therapy/Group: Individual Therapy  Valma Cava 07/09/2022, 3:33 PM

## 2022-07-09 NOTE — Progress Notes (Signed)
Physical Therapy Session Note  Patient Details  Name: Alyssa Lopez MRN: [id number] Date of Birth: [birth year]  Today's Date: 07/09/2022 PT Individual Time: 1004-1100 PT Individual Time Calculation (min): 56 min   Short Term Goals: Week 2:  PT Short Term Goal 1 (Week 2): STG=LTG due to ELOS.  Skilled Therapeutic Interventions/Progress Updates:     Pt received seated in [facility] and agrees to therapy. Reports pain primarily in R knee and R foot. Burning in nature. PT provides rest breaks, mobility, and repositioning to manage pain. Pt's mother and father present for family education. PT discusses car transfer and family agreeable to attempt this session. WC transport outside. Mother brings Administrator, arts for car transfer. PT and pt assess front and back passenger doors. Backseat initially seems to be situated for easier transfer, but upon positioning pt in preparation for transfer, PT notes that there is not enough room between [facility] and door of car to safely attempt transfer. PT suggests use of front seat instead, and positions pt for transfer. Pt completes stand pviot/step transfer from [facility] to car with modA and cues for initiation, sequencing, and positioning. Following extended seated rest break, pt performs additional transfer with her father assisting. Pt able to prop buttocks on seat and PT recommends that pt's mother come around to driver side to provide assistance to scoot pt's hips backward. PT provides cueing throughout for safe positioning and guarding from parents. Parents feel that they would benefit from additional family ed training for car transfer practice.   WC transport back inside. Pt transfers to Nustep with modA and stand step technique, with cues for posture, sequencing, and positioning. Pt completes Nustep to increase ROM of bilateral lower extremities, specifically in ankles and knees. Pt initially notes sharp increase in calf pain, L>R, that improves with increased time on Nustep. Pt  completes x8:00 at workload of 3 with average steps per minute ~25. PT provides cues for foot placement and completing full available ROM.   Stand pivot from Nustep to [facility] with modA/maxA and cues for initiation and sequencing. Pt left seated in WC with all needs within reach.   Therapy Documentation Precautions:  Precautions Precautions: Fall Precaution Comments: R knee can flex past 45 degrees now, "Bledsoe discontinued.per [name], PA.  No written order in the chart. Required Braces or Orthoses: Cervical Brace Knee Immobilizer - Right: Other (comment) Cervical Brace: Soft collar, At all times Splint/Cast: R wrist splint on at all times Restrictions Weight Bearing Restrictions: Yes RUE Weight Bearing: Non weight bearing LUE Weight Bearing: Weight bearing as tolerated RLE Weight Bearing: Non weight bearing LLE Weight Bearing: Weight bearing as tolerated Other Position/Activity Restrictions: RUE and LLE: No ROM restrictions. LUE: Gentle elbow ROM. No ROM restrictions to shoulder, wrist, hand. RLE: Gentle ROM knee 0-45 degrees. No active knee extension against resistrance. No ROM restrictions with hip and ankle.   Therapy/Group: Individual Therapy  Breck Coons, PT, DPT 07/09/2022, 4:42 PM

## 2022-07-09 NOTE — Progress Notes (Signed)
Patient ID: Alyssa Lopez, female   DOB: 2000/11/15, 22 y.o.   MRN: [id number]  SW provided pt with FMLA forms. SW discussed family edu already on schedule for tomorrow 9am-12pm. PT mother asked for fully electric hospital bed. SW shared waiting for final recommendations about DME to place order. Pt concerned she may be extended. She is apprehensive about the idea at this time. SW informed will follow-up after team conference tomorrow.   Loralee Pacas, MSW, [place] Office: [phone] Cell: [phone] Fax: [phone]
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
## 2022-07-10 ENCOUNTER — Inpatient Hospital Stay (HOSPITAL_COMMUNITY): Payer: BC Managed Care – PPO

## 2022-07-10 DIAGNOSIS — F411 Generalized anxiety disorder: Secondary | ICD-10-CM

## 2022-07-10 NOTE — Progress Notes (Signed)
Patient ID: Alyssa Lopez, female   DOB: 2001/05/05, 22 y.o.   MRN: [id number]  SW went by pt room to provide updates from team conference, but pt in OT session and patient care, SW will follow-up.   SW ordered bariatric DABSC, fully electric hospital bed, and w/c.   Pt went back by patient's room, pt off unit, and her mother present. SW informed on DME ordered, and discussed preferred outpatient location. SW will send referral to [facility].   Loralee Pacas, MSW, [place] Office: [phone] Cell: [phone] Fax: [phone]
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
## 2022-07-10 NOTE — Progress Notes (Signed)
Occupational Therapy Session Note  Patient Details  Name: Alyssa Lopez MRN: [id number] Date of Birth: 05-Sep-2000  Today's Date: 07/10/2022 OT Individual Time: 1030-1200 OT Individual Time Calculation (min): 90 min    Short Term Goals: Week 1:  OT Short Term Goal 1 (Week 1): Pt will tolerate EOB >5 min to demo improved pain and tolerance ot upright with MIN A for sitting balance OT Short Term Goal 1 - Progress (Week 1): Met OT Short Term Goal 2 (Week 1): Pt will groom with setup OT Short Term Goal 2 - Progress (Week 1): Met OT Short Term Goal 3 (Week 1): Pt will initate OOB transfer training in prep for [facility] transfer OT Short Term Goal 3 - Progress (Week 1): Met OT Short Term Goal 4 (Week 1): Pt will complete 3/4 steps of donning shirt OT Short Term Goal 4 - Progress (Week 1): Met  Skilled Therapeutic Interventions/Progress Updates:    Pt received in w/c with unrated LE pain that was premedicated. Shower for pain management.  ADL: Pt completes ADL at overall min A showering in roll in shower chair and min A EOB/bed Level dressing. Skilled interventions include:  Heavy MOD A squat pivot transfer w/c>roll in shower chair>EOB Bathing seated on shower chair with pt washing all body parts with washcloth or LHSS except buttocks. Increased time and A to assist with shaving pt legs MAX A seated on shower chair.  EOB dressing with MIN A to thread BLE into pants, Supervision to use sock aide to don socks and total A to don teds. Pt able ot roll in bed to advance pants past hips!  Pt left at end of session in bed with exit alarm on, call light in reach and all needs met    Therapy Documentation Precautions:  Precautions Precautions: Fall Precaution Comments: R knee can flex past 45 degrees now, "Bledsoe discontinued.per [name], PA.  No written order in the chart. Required Braces or Orthoses: Cervical Brace Knee Immobilizer - Right: Other (comment) Cervical Brace: Soft collar, At all  times Splint/Cast: R wrist splint on at all times Restrictions Weight Bearing Restrictions: Yes RUE Weight Bearing: Non weight bearing LUE Weight Bearing: Weight bearing as tolerated RLE Weight Bearing: Non weight bearing LLE Weight Bearing: Weight bearing as tolerated Other Position/Activity Restrictions: RUE and LLE: No ROM restrictions. LUE: Gentle elbow ROM. No ROM restrictions to shoulder, wrist, hand. RLE: Gentle ROM knee 0-45 degrees. No active knee extension against resistrance. No ROM restrictions with hip and ankle. Therapy/Group: Individual Therapy  Tonny Branch 07/10/2022, 6:49 AM

## 2022-07-10 NOTE — Progress Notes (Addendum)
Radiology alerted nursing staff of finding on left elbow films. Messaged ortho surgery.  Narrative & Impression  CLINICAL DATA:  Fracture.  Follow-up exam.   EXAM: LEFT ELBOW - 2 VIEW   COMPARISON:  06/06/2022   FINDINGS: Prior ORIF of olecranon fracture. The proximal olecranon fragment is 1.4 centimeters proximally displaced compared with post reduction images. Distal humerus and proximal radius are intact. Joint effusion present.   IMPRESSION: Prior ORIF of olecranon fracture. There has been interval proximal displacement of the olecranon fragment by 1.4 centimeters.   Joint effusion.   These results will be called to the ordering clinician or representative by the Radiologist Assistant, and communication documented in the PACS or [employer].     Electronically Signed   By: Nolon Nations M.D.   On: 07/10/2022 15:53   Spoke with Molinda Bailiff, PA-C. 

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
## 2022-07-11 ENCOUNTER — Other Ambulatory Visit (HOSPITAL_COMMUNITY): Payer: Self-pay

## 2022-07-11 NOTE — Progress Notes (Signed)
Physical Therapy Discharge Summary  Patient Details  Name: Alyssa Lopez MRN: [id number] Date of Birth: March 13, 2001  Date of Discharge from PT service:July 11, 2022  Today's Date: 07/11/2022 PT Individual Time: 1000-1059 and 1415-1528 PT Individual Time Calculation (min): 59 min and 73 min   Patient has met 5 of 5 long term goals due to improved activity tolerance, improved balance, improved postural control, increased strength, increased range of motion, and decreased pain.  Patient to discharge at a wheelchair level [place].   Patient's care partner is independent to provide the necessary physical assistance at discharge.  Reasons goals not met: NA  Recommendation:  Patient will benefit from ongoing skilled PT services in outpatient setting to continue to advance safe functional mobility, address ongoing impairments in strength, balance, transfers, initiating gait training, and minimize fall risk.  Equipment: 20" x 18" Manual WC  Reasons for discharge: treatment goals met and discharge from hospital  Patient/family agrees with progress made and goals achieved: Yes  Skilled Therapeutic Interventions: 1st Session: Pt received supine in bed asleep. Slow to awaken but is agreeable to therapy. Reports pain in R knee and bilateral feet. PT provides rest breaks, repositioning, and education to manage pain.  Pt performs supine to sit with cues for body mechanics and positioning. Sit to stand with pt using bilateral upper extremities around PT shoulders and minA to facilitate anterior weight shift and hip/trunk stability. ModA overall for stand step transfer to [place]. WC transport to gym. Pt completes stand step transfer to Nustep with modA and cues for positioning. Pt completes Nustep for endurance training and increasing ROM and activity tolerance for lower extremities. Pt completes x15:00 at workload of 3 with average steps per minute ~30, utilizing only lower extremities, with cues for  foot placement and completing full available ROM. Stand step from nustep>WC>bed with modA and same cues. Sit to supine with cues for positioning and sequencing. Left with all needs within reach.  2nd Session: Pt received supine in bed asleep. Reports pain in R knee and bilateral feet. PT provides rest breaks, repositioning, and education to manage pain. Pt performs supine to sit with cues for body mechanics and positioning. Sit to stand with pt using bilateral upper extremities around PT shoulders and minA to facilitate anterior weight shift and hip/trunk stability. ModA overall for stand step transfer to [place]. WC transport to gym. Pt performs stand step to mat with modA and stand step. Sit to supine with cues for sequencing. Pt performs supine therex for strengthening and increasing ROM of bilateral lower extremities. Pt completes 1x15: Ankle pumps, Quad sets, Glute sets, SAQs, hap ab/adduction, SLRs, and x10 bridges in hooklying. PT provides AAROM for SLRs and verbal and tactile cues for neuromuscular feedback and correct performance. Supine to sit with cues for positioning. Stand step from mat>WC>bed with modA. PT provides pt with HEP printout. Left seated with all needs within reach.  PT Discharge Precautions/Restrictions Precautions Precautions: Fall Precaution Comments: R knee can flex past 45 degrees now, "[place] discontinued.per [name], PA.  No written order in the chart. Required Braces or Orthoses: Cervical Brace Cervical Brace: Soft collar;At all times Splint/Cast: R wrist splint on at all times Restrictions Weight Bearing Restrictions: Yes RUE Weight Bearing: Non weight bearing LUE Weight Bearing: Weight bearing as tolerated RLE Weight Bearing: Weight bearing as tolerated (Transfers only) LLE Weight Bearing: Weight bearing as tolerated (Transfers only) Other Position/Activity Restrictions: RUE and LLE: No ROM restrictions. LUE: Gentle elbow ROM. No ROM  restrictions to shoulder,  wrist, hand. RLE: Gentle ROM knee 0-45 degrees. No active knee extension against resistrance. No ROM restrictions with hip and ankle. Pain Pain Assessment Pain Scale: 0-10 Pain Score: 8  Pain Type: Acute pain Pain Interference Pain Interference Pain Effect on Sleep: 4. Almost constantly Pain Interference with Therapy Activities: 2. Occasionally Pain Interference with Day-to-Day Activities: 3. Frequently Vision/Perception  Vision - History Ability to See in Adequate Light: 0 Adequate Perception Perception: Within Functional Limits Praxis Praxis: Intact  Cognition Overall Cognitive Status: Within Functional Limits for tasks assessed Arousal/Alertness: Awake/alert Orientation Level: Oriented X4 Memory: Appears intact Safety/Judgment: Appears intact Sensation Sensation Light Touch: Impaired by gross assessment Additional Comments: Burning and tingline in bilateral feet and toes Coordination Gross Motor Movements are Fluid and Coordinated: No Fine Motor Movements are Fluid and Coordinated: Yes Heel Shin Test: unable to perform Motor  Motor Motor: Abnormal postural alignment and control Motor - Skilled Clinical Observations: pain guarding, improved from eval  Mobility Bed Mobility Bed Mobility: Supine to Sit;Sit to Supine Supine to Sit: Supervision/Verbal cueing Sit to Supine: Supervision/Verbal cueing Transfers Transfers: Sit to Stand;Stand to Sit;Stand Pivot Transfers Sit to Stand: Minimal Assistance - Patient > 75% Stand to Sit: Minimal Assistance - Patient > 75% Stand Pivot Transfers: Moderate Assistance - Patient 50 - 74% Stand Pivot Transfer Details: Tactile cues for initiation;Verbal cues for sequencing;Tactile cues for sequencing;Verbal cues for technique;Manual facilitation for weight shifting Transfer (Assistive device): 1 person hand held assist Locomotion  Gait Ambulation: No Gait Gait: No Stairs / Additional Locomotion Stairs: No Wheelchair  Mobility Wheelchair Mobility: No  Trunk/Postural Assessment  Cervical Assessment Cervical Assessment: Exceptions to [facility] (soft collar on at all times) Thoracic Assessment Thoracic Assessment: Within Functional Limits Lumbar Assessment Lumbar Assessment: Within Functional Limits Postural Control Postural Control: Within Functional Limits  Balance Balance Balance Assessed: Yes Static Sitting Balance Static Sitting - Level of Assistance: 7: Independent Dynamic Sitting Balance Dynamic Sitting - Level of Assistance: 5: Stand by assistance Static Standing Balance Static Standing - Balance Support: During functional activity Static Standing - Level of Assistance: 4: Min assist Dynamic Standing Balance Dynamic Standing - Balance Support: During functional activity Dynamic Standing - Level of Assistance: 4: Min assist Extremity Assessment  RLE Assessment RLE Assessment: Exceptions to [facility] General Strength Comments: grossly 3/5 LLE Assessment LLE Assessment: Exceptions to [facility] General Strength Comments: Grossly 3/5   Breck Coons, PT, DPT 07/11/2022, 4:59 PM

## 2022-07-11 NOTE — Progress Notes (Signed)
Patient ID: Alyssa Lopez, female   DOB: June 19, 2000, 22 y.o.   MRN: [id number]  SW went by pt room and met with pt and pt mother to discuss DME. Pt mother reports she already spoke with [facility] and working on arrangements for delivery. Pt is nervous about going home, but states she is ready. SW reviewed discharge. Would like medication sent to [facility] on [address]. No other questions/concerns reported.   SW faxed outpatient PT referral to [facility] (p:[phone]/[phone]).  Loralee Pacas, MSW, [place] Office: [phone] Cell: [phone] Fax: [phone]
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
## 2022-07-11 NOTE — Progress Notes (Signed)
Recreational Therapy Session Note  Patient Details  Name: Alyssa Lopez MRN: [id number] Date of Birth: February 02, 2001 Today's Date: 07/11/2022  Pain: no c/o  Pt participated in animal assisted activity bed level with supervision.  Mom at bedside.  Both appreciative of this visit. [place] 07/11/2022, 1:23 PM

## 2022-07-11 NOTE — Progress Notes (Signed)
Occupational Therapy Discharge Summary  Patient Details  Name: Alyssa Lopez MRN: [id number] Date of Birth: 12-12-2000  Today's Date: 07/11/2022 OT Individual Time: 2703-5009 OT Individual Time Calculation (min): 63 min   Pt received in bed with unrated LE pain meds provided for pain relief  ADL: Father and pt complete transfer onto the commode with supervision and reminders for body mechanics but overall safe and effective transfer.  Pt completes ADL at overall min-mod A Level. Skilled interventions include: Pt able to cleanse self at end of urination Edu re AE techniques, transfer techniques, scooting to EOB and weight shifting.  Pt able to sit on EOB with AE to thread BLE into pants and don socks for LB/footwear with MIN A Set up for UB ADLs/grooming  Pt left at end of session in bed with exit alarm on, call light in reach and all needs met Date of Discharge from OT service:July 11, 2022  Patient has met 8 of 8 long term goals due to improved activity tolerance, improved balance, postural control, ability to compensate for deficits, and functional use of  RIGHT upper, RIGHT lower, and LEFT lower extremity.  Patient to discharge at [facility] Assist level.  Patient's care partner is independent to provide the necessary physical assistance at discharge. Family has completed family education and has been checked off to assist with toileting in prep for DC home. Family has demo'd safe transfers and understanding of strategies to manage Alyssa Lopez's mobility and BADL needs at home as well as help with PROM of L elbow.    Reasons goals not met: n/a  Recommendation:  Patient will benefit from ongoing skilled OT services in outpatient setting to continue to advance functional skills in the area of BADL, iADL, Vocation, and Reduce care partner burden.  Equipment: Bariatric [facility]  Reasons for discharge: treatment goals met and discharge from hospital  Patient/family agrees with progress made  and goals achieved: Yes  OT Discharge Precautions/Restrictions  Precautions Precautions: Fall Precaution Comments: R knee can flex past 45 degrees now, "B[place]soe discontinued.per Alyssa Spinner, PA.  No written order in the chart. Required Braces or Orthoses: Cervical Brace Cervical Brace: Soft collar;At all times Splint/Cast: R wrist splint on at all times Restrictions Weight Bearing Restrictions: Yes RUE Weight Bearing: Non weight bearing LUE Weight Bearing: Weight bearing as tolerated RLE Weight Bearing: Weight bearing as tolerated LLE Weight Bearing: Weight bearing as tolerated Other Position/Activity Restrictions: RUE and LLE: No ROM restrictions. LUE: Gentle elbow ROM. No ROM restrictions to shoulder, wrist, hand. RLE: Gentle ROM knee 0-45 degrees. No active knee extension against resistrance. No ROM restrictions with hip and ankle. General   Vital Signs  Pain   ADL ADL Eating: Supervision/safety Where Assessed-Eating: Edge of bed Grooming: Setup Where Assessed-Grooming: Edge of bed Upper Body Bathing: Setup Where Assessed-Upper Body Bathing: Edge of bed Lower Body Bathing: Minimal assistance Where Assessed-Lower Body Bathing: Edge of bed, Bed level Upper Body Dressing: Setup Where Assessed-Upper Body Dressing: Edge of bed Lower Body Dressing: Minimal assistance Where Assessed-Lower Body Dressing: Bed level, Edge of bed Toileting: Moderate assistance Where Assessed-Toileting: Bedside Commode Toilet Transfer: Moderate assistance Toilet Transfer Method: Engineer, water: Bedside commode Vision Baseline Vision/History: 1 Wears glasses Patient Visual Report: No change from baseline Vision Assessment?: No apparent visual deficits Perception  Perception: Within Functional Limits Praxis Praxis: Intact Cognition Cognition Overall Cognitive Status: Within Functional Limits for tasks assessed Arousal/Alertness: Awake/alert Orientation Level:  Person;Place;Situation Person: Oriented Place: Oriented Situation: Oriented  Memory: Appears intact Safety/Judgment: Appears intact Brief Interview for Mental Status (BIMS) Repetition of Three Words (First Attempt): 3 Temporal Orientation: Year: Correct Temporal Orientation: Month: Accurate within 5 days Temporal Orientation: Day: Correct Recall: "Sock": Yes, no cue required Recall: "Blue": Yes, no cue required Recall: "Bed": Yes, no cue required BIMS Summary Score: 15 Sensation Sensation Light Touch: Impaired by gross assessment Additional Comments: Burning and tingline in bilateral feet and toes Coordination Gross Motor Movements are Fluid and Coordinated: No Fine Motor Movements are Fluid and Coordinated: Yes Coordination and Movement Description: hand WFL for opposition Motor  Motor Motor: Abnormal postural alignment and control Motor - Skilled Clinical Observations: pain guarding, improved from eval Mobility  Bed Mobility Rolling Right: Minimal Assistance - Patient > 75% Rolling Left: Minimal Assistance - Patient > 75% Supine to Sit: Supervision/Verbal cueing Sit to Supine: Supervision/Verbal cueing Transfers Sit to Stand: Minimal Assistance - Patient > 75% Stand to Sit: Minimal Assistance - Patient > 75%  Trunk/Postural Assessment  Cervical Assessment Cervical Assessment: Exceptions to WFL (collar at all times) Thoracic Assessment Thoracic Assessment: Within Functional Limits Lumbar Assessment Lumbar Assessment: Within Functional Limits Postural Control Postural Control: Within Functional Limits  Balance Balance Balance Assessed: Yes Static Sitting Balance Static Sitting - Level of Assistance: 7: Independent Dynamic Sitting Balance Dynamic Sitting - Level of Assistance: 5: Stand by assistance Static Standing Balance Static Standing - Balance Support: During functional activity Static Standing - Level of Assistance: 4: Min assist Extremity/Trunk  Assessment RUE Assessment General Strength Comments: R wrist splint NWB, full ROM shoulder/digits elbow, WBAT through elbow only LUE Assessment General Strength Comments: olecranon ORIF; NWB, WFL at shoulder, wrist and hand   [place] 07/11/2022, 3:01 PM

## 2022-07-12 ENCOUNTER — Other Ambulatory Visit (HOSPITAL_COMMUNITY): Payer: Self-pay

## 2022-07-12 ENCOUNTER — Telehealth (HOSPITAL_COMMUNITY): Payer: Self-pay | Admitting: Pharmacy Technician

## 2022-07-12 MED ORDER — PREGABALIN 150 MG PO CAPS: 150.0000 mg | ORAL_CAPSULE | Freq: Three times a day (TID) | ORAL | 0 refills | Status: DC

## 2022-07-12 MED ORDER — TAPENTADOL HCL 100 MG PO TABS: 100.0000 mg | ORAL_TABLET | Freq: Four times a day (QID) | ORAL | 0 refills | Status: DC | PRN

## 2022-07-12 MED ORDER — ADULT MULTIVITAMIN W/MINERALS CH: 1.0000 | ORAL_TABLET | Freq: Every day | ORAL | Status: DC

## 2022-07-12 MED ORDER — TRAZODONE HCL 50 MG PO TABS: 25.0000 mg | ORAL_TABLET | Freq: Every evening | ORAL | 0 refills | Status: DC | PRN

## 2022-07-12 MED ORDER — NORTRIPTYLINE HCL 50 MG PO CAPS: 50.0000 mg | ORAL_CAPSULE | Freq: Every day | ORAL | 0 refills | Status: DC

## 2022-07-12 MED ORDER — SENNOSIDES-DOCUSATE SODIUM 8.6-50 MG PO TABS: 2.0000 | ORAL_TABLET | Freq: Every day | ORAL | 0 refills | Status: DC

## 2022-07-12 MED ORDER — LIDOCAINE 5 % EX PTCH: 2.0000 | MEDICATED_PATCH | CUTANEOUS | 0 refills | Status: DC

## 2022-07-12 MED ORDER — ESCITALOPRAM OXALATE 20 MG PO TABS: 20.0000 mg | ORAL_TABLET | Freq: Every day | ORAL | 0 refills | Status: DC

## 2022-07-12 MED ORDER — OXYCODONE HCL ER 20 MG PO T12A: 20.0000 mg | EXTENDED_RELEASE_TABLET | Freq: Two times a day (BID) | ORAL | 0 refills | Status: DC

## 2022-07-12 MED ORDER — MELATONIN 3 MG PO TABS: 3.0000 mg | ORAL_TABLET | Freq: Every day | ORAL | 0 refills | Status: DC

## 2022-07-12 MED ORDER — METHOCARBAMOL 1000 MG PO TABS: 1000.0000 mg | ORAL_TABLET | Freq: Four times a day (QID) | ORAL | 0 refills | Status: DC

## 2022-07-12 MED ORDER — HYDROMORPHONE HCL 2 MG PO TABS: 1.0000 mg | ORAL_TABLET | Freq: Two times a day (BID) | ORAL | 0 refills | Status: DC | PRN

## 2022-07-12 MED ORDER — IBUPROFEN 800 MG PO TABS: 800.0000 mg | ORAL_TABLET | Freq: Three times a day (TID) | ORAL | 0 refills | Status: DC

## 2022-07-12 MED ORDER — OXYCODONE HCL 15 MG PO TABS: 15.0000 mg | ORAL_TABLET | ORAL | 0 refills | Status: DC | PRN

## 2022-07-12 MED ORDER — CLONAZEPAM 0.5 MG PO TABS: 0.5000 mg | ORAL_TABLET | Freq: Two times a day (BID) | ORAL | 0 refills | Status: DC

## 2022-07-12 MED ORDER — ACETAMINOPHEN 500 MG PO TABS: 1000.0000 mg | ORAL_TABLET | Freq: Four times a day (QID) | ORAL | 0 refills | Status: DC

## 2022-07-12 MED ORDER — TRAMADOL HCL 50 MG PO TABS: 50.0000 mg | ORAL_TABLET | Freq: Four times a day (QID) | ORAL | 0 refills | Status: DC

## 2022-07-12 MED ORDER — ASPIRIN 325 MG PO TBEC: 325.0000 mg | DELAYED_RELEASE_TABLET | Freq: Every day | ORAL | 0 refills | Status: DC

## 2022-07-12 MED ORDER — VITAMIN D (ERGOCALCIFEROL) 1.25 MG (50000 UNIT) PO CAPS: 50000.0000 [IU] | ORAL_CAPSULE | ORAL | 0 refills | Status: DC

## 2022-07-12 MED ORDER — HYDROMORPHONE HCL 4 MG PO TABS: 2.0000 mg | ORAL_TABLET | Freq: Two times a day (BID) | ORAL | 0 refills | Status: DC | PRN

## 2022-07-12 NOTE — Progress Notes (Signed)
Patient ID: Alyssa Lopez, female   DOB: [birth year], 21 y.o.   MRN: [id number]  SW met with pt and pt parents in room to review d/c. SW confirms DME delivered to room - w/c and bari DABSC. Mother confirms hospital bed delivered last night. No other questions/concerns reported.   Loralee Pacas, MSW, [place] Office: [phone] Cell: [phone] Fax: [phone]

## 2022-07-12 NOTE — Progress Notes (Signed)
Inpatient Rehabilitation Discharge  Medication Review by a Pharmacist  A complete drug regimen review was completed for this patient to identify any potential clinically significant medication issues.  High Risk Drug Classes Is patient taking? Indication by Medication  Antipsychotic No   Anticoagulant No   Antibiotic No   Opioid Yes Oxycodone, dilaudid, tramadol, tapentadol-pain  Antiplatelet No   Hypoglycemics/insulin No   Vasoactive Medication No   Chemotherapy No   Other Yes Lyrica,Lidocaine, Tylenol, ibuprofen, Nortriptyline-pain Lexapro, klonopin- depression /anxiety Melatonin, trazodone-sleep Robaxin-muscle spasms Senokot-S: constipation Sprintiec28-birth control MVI- vitamin supplement Vitamin D-vit D deficiency     Type of Medication Issue Identified Description of Issue Recommendation(s)  Drug Interaction(s) (clinically significant)     Duplicate Therapy     Allergy     No Medication Administration End Date     Incorrect Dose     Additional Drug Therapy Needed     Significant med changes from prior encounter (inform family/care partners about these prior to discharge).    Other       Clinically significant medication issues were identified that warrant physician communication and completion of prescribed/recommended actions by midnight of the next day:  No  Name of provider notified for urgent issues identified:   Provider Method of Notification:    Pharmacist comments:   Time spent performing this drug regimen review (minutes):  30 minutes   Thank you for allowing pharmacy to be part of this patients care team.  Nicole Cella, RPh Clinical Pharmacist 07/12/2022 9:36 AM

## 2022-07-12 NOTE — Progress Notes (Signed)
Discharge instructions/medications reviewed with patient from PA. Staff safely assisted patient in the car, patient discharged via private car with family.    Yehuda Mao, LPN

## 2022-07-12 NOTE — Progress Notes (Signed)
Informed patient and family (mom) of education for Lovenox. Mom refused education. Per mom she knows how to administer Lovenox, mom is a Marine scientist w/[employer]. PA is aware.     Yehuda Mao, LPN

## 2022-07-12 NOTE — Progress Notes (Signed)
Recreational Therapy Discharge Summary Patient Details  Name: Alyssa Lopez MRN: [id number] Date of Birth: 2000-12-14 Today's Date: 07/12/2022  Comments on progress toward goals: Pt has made good progress during LOS and is discharging home with family to provide supervision/assistance.  TR sessions focused on leisure education, activity analysis, pet therapy, and stress management/coping.    Reasons for discharge: discharge from hospital  Follow-up: Outpatient  Patient/family agrees with progress made and goals achieved: Yes  , 07/12/2022, 4:24 PM

## 2022-07-12 NOTE — Telephone Encounter (Signed)
Rx of oxycodone ir 15mg  q4 prn sent to pharmacy until we can get PA for nucynta. Pharmacy also has 4mg  dilaudid. Will rx 2mg  q12 prn severe pain. I spoke to mom who's aware.

## 2022-07-12 NOTE — Progress Notes (Signed)
Nursing education and nursing care plan competed. Patient discharged home.

## 2022-07-12 NOTE — Progress Notes (Signed)
Inpatient Rehabilitation Care Coordinator Discharge Note   Patient Details  Name: Alyssa Lopez MRN: [id number] Date of Birth: 04-19-2001   Discharge location: D/c to home with parents  Length of Stay: 19 days  Discharge activity level: wheelchair level Min Assist  Home/community participation: Limited  Patient response KP:VVZSMO Literacy - How often do you need to have someone help you when you read instructions, pamphlets, or other written material from your doctor or pharmacy?: Never  Patient response LM:BEMLJQ Isolation - How often do you feel lonely or isolated from those around you?: Often  Services provided included: MD, RD, PT, OT, SLP, RN, CM, Pharmacy, TR, Neuropsych, SW  Financial Services:  Charity fundraiser Utilized: Other (Comment) [employer]  Choices offered to/list presented to: pt and parents  Follow-up services arranged:  Outpatient, DME, Mental Health services (referral made for neuropsych)    Outpatient Servicies: [facility] for outpatient PT DME : [facility] for w/c, bariatric drop arm bedside commode, and fully electric hospital    Patient response to transportation need: Is the patient able to respond to transportation needs?: Yes In the past 12 months, has lack of transportation kept you from medical appointments or from getting medications?: No In the past 12 months, has lack of transportation kept you from meetings, work, or from getting things needed for daily living?: No   Comments (or additional information):  Patient/Family verbalized understanding of follow-up arrangements:  Yes  Individual responsible for coordination of the follow-up plan: contact pt mother [name] [phone]   or father Marden Noble [phone]  Confirmed correct DME delivered: Rana Snare 07/12/2022    Rana Snare
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
## 2022-07-12 NOTE — Telephone Encounter (Signed)
Patient Advocate Encounter   Received notification that prior authorization for Nucynta ER 100MG  er tablets is required.   PA submitted on 07/12/2022 Key [id number] Status is pending       Lyndel Safe, [place] Patient Advocate Specialist [facility] Patient Advocate Team Direct Number: [phone]  Fax: [phone]

## 2022-07-17 ENCOUNTER — Telehealth: Payer: Self-pay | Admitting: Physical Medicine & Rehabilitation

## 2022-07-17 MED ORDER — CYCLOBENZAPRINE HCL 10 MG PO TABS: 10.0000 mg | ORAL_TABLET | Freq: Three times a day (TID) | ORAL | 2 refills | Status: DC | PRN

## 2022-07-17 MED ORDER — TRAMADOL HCL 50 MG PO TABS: 50.0000 mg | ORAL_TABLET | Freq: Four times a day (QID) | ORAL | 0 refills | Status: DC

## 2022-07-17 NOTE — Telephone Encounter (Signed)
Patients mom called about her daughters trimodal, oxycontin 12 hour, methocarbamol. The patient starts PT tomorrow and she needs the medicine.

## 2022-07-17 NOTE — Telephone Encounter (Signed)
Spoke with mom. Refilled tramadol. Replaced robaxin with flexeril (robaxin requiring prior auth?).  No oxycodone needed right now. Will stay with IR only.  Nerve pain better. Left knee giving her most of problems currently

## 2022-07-18 ENCOUNTER — Other Ambulatory Visit: Payer: Self-pay

## 2022-07-18 ENCOUNTER — Encounter: Payer: Self-pay | Admitting: Rehabilitation

## 2022-07-18 ENCOUNTER — Ambulatory Visit: Payer: BC Managed Care – PPO | Attending: Physician Assistant | Admitting: Rehabilitation

## 2022-07-18 DIAGNOSIS — M6281 Muscle weakness (generalized): Secondary | ICD-10-CM | POA: Insufficient documentation

## 2022-07-18 NOTE — Therapy (Signed)
[facility] [address] [place], [place], [address] Phone: [phone]   Fax:  [phone]  Patient Details  Name: Alyssa Lopez MRN: [id number] Date of Birth: 28-Jan-2001 Referring Provider:  Mancel Bale, PA-C  Encounter Date: 07/18/2022   Pt arrived with mother/father for PT evaluation.  Note that they have ortho MD appt next Wednesday for hopeful clearance of R and L UE WB precautions.  Feel that we are pretty limited with mobility until she is cleared from this standpoint.  They are doing transfers and bed level HEP at home.  Encouraged them to continue this.  I did discuss a few options for exercises as well as showed them a tub transfer bench from [facility] that may make getting in/out of shower easier.  Also discussed aquatic therapy with them once cleared and wounds healed.  Pt interested in this so will keep PT posted on restrictions. Also educated them to ask about OT eval if she is cleared from a WB standpoint.  Pt and parents verbalized understanding.  PT eval rescheduled to this PT next week following ortho MD appt.      Cameron Sprang, PT, MPT [facility] [address] [place], [place], [address] Phone: [phone]   Fax:  [phone] 07/18/22, 10:59 AM

## 2022-07-24 ENCOUNTER — Ambulatory Visit: Payer: BC Managed Care – PPO | Admitting: Physical Therapy

## 2022-07-26 ENCOUNTER — Ambulatory Visit: Payer: BC Managed Care – PPO | Attending: Physician Assistant | Admitting: Rehabilitation

## 2022-07-26 ENCOUNTER — Encounter: Payer: Self-pay | Admitting: Rehabilitation

## 2022-07-26 DIAGNOSIS — T07XXXA Unspecified multiple injuries, initial encounter: Secondary | ICD-10-CM | POA: Insufficient documentation

## 2022-07-26 DIAGNOSIS — R278 Other lack of coordination: Secondary | ICD-10-CM | POA: Diagnosis present

## 2022-07-26 DIAGNOSIS — R208 Other disturbances of skin sensation: Secondary | ICD-10-CM | POA: Insufficient documentation

## 2022-07-26 DIAGNOSIS — M6281 Muscle weakness (generalized): Secondary | ICD-10-CM | POA: Insufficient documentation

## 2022-07-26 DIAGNOSIS — M24562 Contracture, left knee: Secondary | ICD-10-CM | POA: Insufficient documentation

## 2022-07-26 DIAGNOSIS — R29898 Other symptoms and signs involving the musculoskeletal system: Secondary | ICD-10-CM | POA: Insufficient documentation

## 2022-07-26 DIAGNOSIS — R2681 Unsteadiness on feet: Secondary | ICD-10-CM | POA: Insufficient documentation

## 2022-07-26 DIAGNOSIS — R293 Abnormal posture: Secondary | ICD-10-CM | POA: Diagnosis present

## 2022-07-26 DIAGNOSIS — R2689 Other abnormalities of gait and mobility: Secondary | ICD-10-CM | POA: Insufficient documentation

## 2022-07-26 DIAGNOSIS — M79604 Pain in right leg: Secondary | ICD-10-CM | POA: Insufficient documentation

## 2022-07-26 DIAGNOSIS — M79605 Pain in left leg: Secondary | ICD-10-CM | POA: Diagnosis present
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
## 2022-07-26 NOTE — Patient Instructions (Signed)
  Aquatic Therapy: What to Expect!  Where:  [facility] [address] [place], [place]  [address] [phone]  NOTE:  You will receive an automated phone message reminding you of your appointment and it will say the appointment is at the Rehab Center on [address]  We are working to fix this- just know that you will meet us at the pool!  How to Prepare: Please make sure you drink 8 ounces of water about one hour prior to your pool session A caregiver MUST attend the entire session with the patient.  The caregiver will be responsible for assisting with dressing as well as any toileting needs.  If the patient will be doing a home program this should likely be the person who will assist as well.  Patients must wear either their street shoes or pool shoes until they are ready to enter the pool with the therapist.  Patients must also wear either street shoes or pool shoes once exiting the pool to walk to the locker room.  This will helps us prevent slips and falls.  Please arrive 15 minutes early to prepare for your pool therapy session Sign in at the front desk on the clipboard marked for [place] You may use the locker rooms on your right and then enter directly into the recreation pool (NOT the competition pool) Please make sure to attend to any toileting needs prior to entering the pool Please be dressed in your swim suit and on the pool deck at least 5 minutes before your appointment Once on the pool deck your therapist will ask you to sign the Patient  Consent and Assignment of Benefits form Your therapist may take your blood pressure prior to, during and after your session if indicated  About the pool  and parking: Entering the pool Your therapist will assist you; there are 2 ways to enter:  stairs with railings or with a chair lift.   Your therapist will determine the most appropriate way for you. Water temperature is usually between 86-87 degrees There  may be other swimmers in the pool at the same time Parking is free.   Contact Info:     Appointments: [facility]  All sessions are 45 minutes   [address]  [address]     Please call the [facility] if   [place],    [address]    you need to cancel or reschedule an appointment.  [phone]       

## 2022-07-30 ENCOUNTER — Ambulatory Visit (INDEPENDENT_AMBULATORY_CARE_PROVIDER_SITE_OTHER): Payer: BC Managed Care – PPO | Admitting: Psychology

## 2022-07-30 ENCOUNTER — Ambulatory Visit: Payer: BC Managed Care – PPO

## 2022-07-30 ENCOUNTER — Encounter: Payer: BC Managed Care – PPO | Attending: Registered Nurse | Admitting: Registered Nurse

## 2022-07-30 ENCOUNTER — Encounter: Payer: Self-pay | Admitting: Registered Nurse

## 2022-07-30 VITALS — BP 119/79 | HR 109 | Ht 62.0 in

## 2022-07-30 DIAGNOSIS — F4322 Adjustment disorder with anxiety: Secondary | ICD-10-CM

## 2022-07-30 DIAGNOSIS — Z5181 Encounter for therapeutic drug level monitoring: Secondary | ICD-10-CM | POA: Insufficient documentation

## 2022-07-30 DIAGNOSIS — M6281 Muscle weakness (generalized): Secondary | ICD-10-CM

## 2022-07-30 DIAGNOSIS — Z79891 Long term (current) use of opiate analgesic: Secondary | ICD-10-CM | POA: Insufficient documentation

## 2022-07-30 DIAGNOSIS — T07XXXA Unspecified multiple injuries, initial encounter: Secondary | ICD-10-CM | POA: Diagnosis not present

## 2022-07-30 DIAGNOSIS — G894 Chronic pain syndrome: Secondary | ICD-10-CM | POA: Diagnosis present

## 2022-07-30 DIAGNOSIS — R2681 Unsteadiness on feet: Secondary | ICD-10-CM

## 2022-07-30 DIAGNOSIS — F411 Generalized anxiety disorder: Secondary | ICD-10-CM | POA: Diagnosis not present

## 2022-07-30 DIAGNOSIS — R293 Abnormal posture: Secondary | ICD-10-CM

## 2022-07-30 MED ORDER — CLONAZEPAM 0.5 MG PO TABS: 0.5000 mg | ORAL_TABLET | Freq: Two times a day (BID) | ORAL | 1 refills | Status: DC

## 2022-07-30 MED ORDER — TRAMADOL HCL 50 MG PO TABS: 50.0000 mg | ORAL_TABLET | Freq: Four times a day (QID) | ORAL | 1 refills | Status: DC

## 2022-07-30 MED ORDER — NORTRIPTYLINE HCL 50 MG PO CAPS: 50.0000 mg | ORAL_CAPSULE | Freq: Every day | ORAL | 1 refills | Status: DC

## 2022-07-30 MED ORDER — PREGABALIN 150 MG PO CAPS: 150.0000 mg | ORAL_CAPSULE | Freq: Three times a day (TID) | ORAL | 1 refills | Status: DC

## 2022-07-30 MED ORDER — TRAZODONE HCL 50 MG PO TABS: 50.0000 mg | ORAL_TABLET | Freq: Every evening | ORAL | 1 refills | Status: DC | PRN
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
## 2022-07-30 NOTE — Patient Instructions (Signed)
Dr. Kathyrn Sheriff,  [address], [place] [address] [phone]
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
## 2022-07-31 ENCOUNTER — Encounter: Payer: Self-pay | Admitting: Rehabilitation

## 2022-07-31 ENCOUNTER — Ambulatory Visit: Payer: Self-pay | Admitting: Rehabilitation

## 2022-07-31 DIAGNOSIS — M6281 Muscle weakness (generalized): Secondary | ICD-10-CM

## 2022-07-31 DIAGNOSIS — R2681 Unsteadiness on feet: Secondary | ICD-10-CM

## 2022-07-31 DIAGNOSIS — M79604 Pain in right leg: Secondary | ICD-10-CM

## 2022-07-31 DIAGNOSIS — R293 Abnormal posture: Secondary | ICD-10-CM
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
## 2022-08-01 ENCOUNTER — Ambulatory Visit: Payer: BC Managed Care – PPO | Admitting: Rehabilitation

## 2022-08-01 ENCOUNTER — Other Ambulatory Visit: Payer: Self-pay | Admitting: Physician Assistant

## 2022-08-01 LAB — DRUG TOX ALC METAB W/CON, ORAL FLD: Alcohol Metabolite: NEGATIVE ng/mL (ref <25)

## 2022-08-01 LAB — DRUG TOX MONITOR 1 W/CONF, ORAL FLD
Alprazolam: NEGATIVE ng/mL (ref <0.50)
Amphetamines: NEGATIVE ng/mL (ref <10)
Barbiturates: NEGATIVE ng/mL (ref <10)
Benzodiazepines: POSITIVE ng/mL — AB (ref <0.50)
Buprenorphine: NEGATIVE ng/mL (ref <0.10)
Buprenorphine: NEGATIVE ng/mL (ref <0.10)
Chlordiazepoxide: NEGATIVE ng/mL (ref <0.50)
Clonazepam: 1.06 ng/mL — ABNORMAL HIGH (ref <0.50)
Cocaine: NEGATIVE ng/mL (ref <5.0)
Codeine: NEGATIVE ng/mL (ref <2.5)
Diazepam: NEGATIVE ng/mL (ref <0.50)
Dihydrocodeine: NEGATIVE ng/mL (ref <2.5)
Fentanyl: NEGATIVE ng/mL (ref <0.10)
Flunitrazepam: NEGATIVE ng/mL (ref <0.50)
Flurazepam: NEGATIVE ng/mL (ref <0.50)
Heroin Metabolite: NEGATIVE ng/mL (ref <1.0)
Hydrocodone: NEGATIVE ng/mL (ref <2.5)
Hydromorphone: NEGATIVE ng/mL (ref <2.5)
Lorazepam: NEGATIVE ng/mL (ref <0.50)
MARIJUANA: NEGATIVE ng/mL (ref <2.5)
MDMA: NEGATIVE ng/mL (ref <10)
Meprobamate: NEGATIVE ng/mL (ref <2.5)
Methadone: NEGATIVE ng/mL (ref <5.0)
Midazolam: NEGATIVE ng/mL (ref <0.50)
Morphine: NEGATIVE ng/mL (ref <2.5)
Naloxone: NEGATIVE ng/mL (ref <0.25)
Nicotine Metabolite: NEGATIVE ng/mL (ref <5.0)
Norbuprenorphine: NEGATIVE ng/mL (ref <0.50)
Nordiazepam: NEGATIVE ng/mL (ref <0.50)
Norhydrocodone: NEGATIVE ng/mL (ref <2.5)
Noroxycodone: 37.3 ng/mL — ABNORMAL HIGH (ref <2.5)
Opiates: POSITIVE ng/mL — AB (ref <2.5)
Oxazepam: NEGATIVE ng/mL (ref <0.50)
Oxycodone: 101.7 ng/mL — ABNORMAL HIGH (ref <2.5)
Oxymorphone: NEGATIVE ng/mL (ref <2.5)
Phencyclidine: NEGATIVE ng/mL (ref <10)
Tapentadol: NEGATIVE ng/mL (ref <5.0)
Temazepam: NEGATIVE ng/mL (ref <0.50)
Tramadol: >500 ng/mL — ABNORMAL HIGH (ref <5.0)
Tramadol: POSITIVE ng/mL — AB (ref <5.0)
Triazolam: NEGATIVE ng/mL (ref <0.50)
Zolpidem: NEGATIVE ng/mL (ref <5.0)

## 2022-08-03 ENCOUNTER — Ambulatory Visit: Payer: BC Managed Care – PPO

## 2022-08-03 DIAGNOSIS — M6281 Muscle weakness (generalized): Secondary | ICD-10-CM | POA: Diagnosis not present

## 2022-08-03 DIAGNOSIS — R293 Abnormal posture: Secondary | ICD-10-CM

## 2022-08-03 DIAGNOSIS — R2681 Unsteadiness on feet: Secondary | ICD-10-CM
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
## 2022-08-06 ENCOUNTER — Ambulatory Visit: Payer: BC Managed Care – PPO | Admitting: Physical Therapy

## 2022-08-06 DIAGNOSIS — R2689 Other abnormalities of gait and mobility: Secondary | ICD-10-CM

## 2022-08-06 DIAGNOSIS — R2681 Unsteadiness on feet: Secondary | ICD-10-CM

## 2022-08-06 DIAGNOSIS — M6281 Muscle weakness (generalized): Secondary | ICD-10-CM
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
## 2022-08-07 ENCOUNTER — Telehealth: Payer: Self-pay | Admitting: Rehabilitation

## 2022-08-07 ENCOUNTER — Ambulatory Visit: Payer: BC Managed Care – PPO | Admitting: Rehabilitation

## 2022-08-07 DIAGNOSIS — R2689 Other abnormalities of gait and mobility: Secondary | ICD-10-CM

## 2022-08-07 DIAGNOSIS — R293 Abnormal posture: Secondary | ICD-10-CM

## 2022-08-07 DIAGNOSIS — M6281 Muscle weakness (generalized): Secondary | ICD-10-CM

## 2022-08-07 DIAGNOSIS — R2681 Unsteadiness on feet: Secondary | ICD-10-CM

## 2022-08-07 DIAGNOSIS — M24562 Contracture, left knee: Secondary | ICD-10-CM

## 2022-08-07 DIAGNOSIS — M79604 Pain in right leg: Secondary | ICD-10-CM

## 2022-08-07 DIAGNOSIS — T07XXXA Unspecified multiple injuries, initial encounter: Secondary | ICD-10-CM

## 2022-08-07 NOTE — Telephone Encounter (Signed)
Order in. Thx Raquel Sarna!
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
## 2022-08-07 NOTE — Telephone Encounter (Signed)
Dr. Naaman Plummer,   I am seeing Alyssa Lopez here at [facility] for PT.  Now that she has been cleared of all WB restrictions in UEs, could you please write order for OT evaluation and we can get that scheduled?  Thanks,  Cameron Sprang, PT, MPT [facility] [address] [place], [place], [address] Phone: [phone]   Fax:  [phone] 08/07/22, 1:55 PM

## 2022-08-08 ENCOUNTER — Ambulatory Visit: Payer: BC Managed Care – PPO | Admitting: Rehabilitation

## 2022-08-10 ENCOUNTER — Ambulatory Visit: Payer: BC Managed Care – PPO

## 2022-08-10 DIAGNOSIS — M6281 Muscle weakness (generalized): Secondary | ICD-10-CM

## 2022-08-10 DIAGNOSIS — R2689 Other abnormalities of gait and mobility: Secondary | ICD-10-CM

## 2022-08-10 DIAGNOSIS — R2681 Unsteadiness on feet: Secondary | ICD-10-CM

## 2022-08-10 DIAGNOSIS — R293 Abnormal posture: Secondary | ICD-10-CM
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
## 2022-08-13 ENCOUNTER — Ambulatory Visit: Payer: BC Managed Care – PPO | Attending: Physician Assistant

## 2022-08-13 ENCOUNTER — Ambulatory Visit: Payer: BC Managed Care – PPO | Admitting: Occupational Therapy

## 2022-08-13 ENCOUNTER — Encounter: Payer: Self-pay | Admitting: Occupational Therapy

## 2022-08-13 DIAGNOSIS — T07XXXA Unspecified multiple injuries, initial encounter: Secondary | ICD-10-CM

## 2022-08-13 DIAGNOSIS — R293 Abnormal posture: Secondary | ICD-10-CM | POA: Diagnosis present

## 2022-08-13 DIAGNOSIS — R208 Other disturbances of skin sensation: Secondary | ICD-10-CM

## 2022-08-13 DIAGNOSIS — M6281 Muscle weakness (generalized): Secondary | ICD-10-CM | POA: Insufficient documentation

## 2022-08-13 DIAGNOSIS — R2689 Other abnormalities of gait and mobility: Secondary | ICD-10-CM | POA: Insufficient documentation

## 2022-08-13 DIAGNOSIS — R2681 Unsteadiness on feet: Secondary | ICD-10-CM | POA: Diagnosis present

## 2022-08-13 DIAGNOSIS — R278 Other lack of coordination: Secondary | ICD-10-CM

## 2022-08-13 DIAGNOSIS — R29898 Other symptoms and signs involving the musculoskeletal system: Secondary | ICD-10-CM
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
## 2022-08-14 ENCOUNTER — Ambulatory Visit: Payer: BC Managed Care – PPO | Admitting: Rehabilitation

## 2022-08-14 DIAGNOSIS — M6281 Muscle weakness (generalized): Secondary | ICD-10-CM | POA: Diagnosis not present

## 2022-08-14 DIAGNOSIS — R293 Abnormal posture: Secondary | ICD-10-CM

## 2022-08-14 DIAGNOSIS — M79604 Pain in right leg: Secondary | ICD-10-CM

## 2022-08-14 DIAGNOSIS — R2681 Unsteadiness on feet: Secondary | ICD-10-CM

## 2022-08-14 DIAGNOSIS — M24562 Contracture, left knee: Secondary | ICD-10-CM

## 2022-08-14 DIAGNOSIS — R2689 Other abnormalities of gait and mobility: Secondary | ICD-10-CM
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
## 2022-08-15 ENCOUNTER — Ambulatory Visit: Payer: BC Managed Care – PPO | Admitting: Rehabilitation

## 2022-08-17 ENCOUNTER — Ambulatory Visit: Payer: BC Managed Care – PPO | Attending: Physician Assistant | Admitting: Physical Therapy

## 2022-08-17 DIAGNOSIS — M79604 Pain in right leg: Secondary | ICD-10-CM | POA: Insufficient documentation

## 2022-08-17 DIAGNOSIS — M6281 Muscle weakness (generalized): Secondary | ICD-10-CM | POA: Diagnosis present

## 2022-08-17 DIAGNOSIS — R278 Other lack of coordination: Secondary | ICD-10-CM | POA: Diagnosis present

## 2022-08-17 DIAGNOSIS — M79605 Pain in left leg: Secondary | ICD-10-CM | POA: Insufficient documentation

## 2022-08-17 DIAGNOSIS — R293 Abnormal posture: Secondary | ICD-10-CM | POA: Insufficient documentation

## 2022-08-17 DIAGNOSIS — R2681 Unsteadiness on feet: Secondary | ICD-10-CM | POA: Diagnosis present

## 2022-08-17 DIAGNOSIS — R29898 Other symptoms and signs involving the musculoskeletal system: Secondary | ICD-10-CM | POA: Insufficient documentation

## 2022-08-17 DIAGNOSIS — R2689 Other abnormalities of gait and mobility: Secondary | ICD-10-CM | POA: Diagnosis present

## 2022-08-17 DIAGNOSIS — T07XXXA Unspecified multiple injuries, initial encounter: Secondary | ICD-10-CM | POA: Diagnosis present

## 2022-08-17 DIAGNOSIS — R208 Other disturbances of skin sensation: Secondary | ICD-10-CM | POA: Insufficient documentation
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
## 2022-08-20 ENCOUNTER — Other Ambulatory Visit: Payer: Self-pay | Admitting: Physical Medicine & Rehabilitation

## 2022-08-20 ENCOUNTER — Ambulatory Visit: Payer: BC Managed Care – PPO

## 2022-08-20 DIAGNOSIS — M6281 Muscle weakness (generalized): Secondary | ICD-10-CM

## 2022-08-20 DIAGNOSIS — R278 Other lack of coordination: Secondary | ICD-10-CM

## 2022-08-20 DIAGNOSIS — R2681 Unsteadiness on feet: Secondary | ICD-10-CM | POA: Diagnosis not present

## 2022-08-20 DIAGNOSIS — R293 Abnormal posture: Secondary | ICD-10-CM

## 2022-08-20 DIAGNOSIS — R2689 Other abnormalities of gait and mobility: Secondary | ICD-10-CM

## 2022-08-20 MED ORDER — IBUPROFEN 800 MG PO TABS: 800.0000 mg | ORAL_TABLET | Freq: Three times a day (TID) | ORAL | 0 refills | Status: DC
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
## 2022-08-20 NOTE — Telephone Encounter (Signed)
Patient's mother is requesting a refill on Ibuprofen 800 mg. She states she knows it's otc but is easier to maintain as rx since her daughter has so many medications.

## 2022-08-20 NOTE — Telephone Encounter (Signed)
Sonya spoke with Ms Dauberman mother,  We will continue Ibuprofen. She has a scheduled appointment with Dr Naaman Plummer this month.

## 2022-08-20 NOTE — Telephone Encounter (Signed)
Pt requesting '800mg'$  Ibuprofen.

## 2022-08-20 NOTE — Telephone Encounter (Signed)
Per patient's mother she is not using Oxycodone daily unless patient has therapy and is in pain. Patient is using Tramadol, Ibuprofen and Tylenol mostly.

## 2022-08-21 ENCOUNTER — Encounter: Payer: Self-pay | Admitting: Rehabilitation

## 2022-08-21 ENCOUNTER — Ambulatory Visit: Payer: BC Managed Care – PPO | Admitting: Rehabilitation

## 2022-08-21 DIAGNOSIS — R293 Abnormal posture: Secondary | ICD-10-CM

## 2022-08-21 DIAGNOSIS — M79605 Pain in left leg: Secondary | ICD-10-CM

## 2022-08-21 DIAGNOSIS — R2681 Unsteadiness on feet: Secondary | ICD-10-CM

## 2022-08-21 DIAGNOSIS — R2689 Other abnormalities of gait and mobility: Secondary | ICD-10-CM

## 2022-08-21 DIAGNOSIS — M79604 Pain in right leg: Secondary | ICD-10-CM

## 2022-08-21 DIAGNOSIS — M6281 Muscle weakness (generalized): Secondary | ICD-10-CM
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
## 2022-08-22 ENCOUNTER — Ambulatory Visit: Payer: BC Managed Care – PPO | Admitting: Physical Therapy

## 2022-08-22 ENCOUNTER — Ambulatory Visit: Payer: BC Managed Care – PPO | Admitting: Occupational Therapy

## 2022-08-22 ENCOUNTER — Encounter: Payer: Self-pay | Admitting: Occupational Therapy

## 2022-08-22 DIAGNOSIS — M6281 Muscle weakness (generalized): Secondary | ICD-10-CM

## 2022-08-22 DIAGNOSIS — R278 Other lack of coordination: Secondary | ICD-10-CM

## 2022-08-22 DIAGNOSIS — R2681 Unsteadiness on feet: Secondary | ICD-10-CM | POA: Diagnosis not present

## 2022-08-22 DIAGNOSIS — R29898 Other symptoms and signs involving the musculoskeletal system: Secondary | ICD-10-CM

## 2022-08-22 DIAGNOSIS — T07XXXA Unspecified multiple injuries, initial encounter: Secondary | ICD-10-CM

## 2022-08-22 DIAGNOSIS — R208 Other disturbances of skin sensation: Secondary | ICD-10-CM
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
## 2022-08-22 NOTE — Patient Instructions (Signed)
Putty Exercises All exercises to be completed TWICE daily Comments **Putty has a tendency to spread out to fit its environment. Please keep putty contained when not in use, out of the heat, out of reach of children and animals, and away from clothing/fabric as it may stick.** SEARCH Locate and pinch each individual item out of the putty. These can be COINS, BUTTONS, MARBLES, ETC. By holding putty in opposite hand, use affected hand to search and remove items. You can also do this with putty on table (as pictured). Repeat until all items have been removed from the putty.   USE:  3 items for 1 hand  OR 1-2 items for each hand    GRIP Hold the putty in the palm of your hand. Now grip the putty bending your fingers into the putty as far as you can until they quit moving. REPEAT:  10 times HOLD: until fingers quit moving or about 5 seconds    [place] Roll up some putty to create a small tubular section. Next, pinch the putty and repeat down the section.   i.e. thumb to index finger, thumb to middle finger, thumb to ring finger, and thumb to pinky finger.        REPEAT:  5 times each finger HOLD:  at least 3 seconds   KEY GRIP Hold the putty at the top of your hand. Squeeze the putty between your thumb and the side of your 2nd finger as shown.    -Think of holding a JEOPRADY buzzer in your hand  -Aim for the second knuckle of your second finger.  REPEAT:  10 times HOLD:  at least 3 seconds  TABLE SPREAD Flatten putty on a table (i.e. make a pancake). Place all fingers together and press down into the putty. Next, spread the putty out with your fingertips as shown.        OR place all fingers together and wrap putty around them as if placing fingers into the center of a doughnut and spread them open.  - Additionally, this can be done using a hair tie or rubber band.    REPEAT:  5 times  HOLD:  at least 3 seconds     ROPE SQUEEZE Roll or pull out putty to make a log. Wind the  putty rope between your fingers. Squeeze your fingers together.          REPEAT:  5 times  HOLD:  at least 3 seconds

## 2022-08-24 ENCOUNTER — Ambulatory Visit: Payer: BC Managed Care – PPO

## 2022-08-24 DIAGNOSIS — R2689 Other abnormalities of gait and mobility: Secondary | ICD-10-CM

## 2022-08-24 DIAGNOSIS — R293 Abnormal posture: Secondary | ICD-10-CM

## 2022-08-24 DIAGNOSIS — R2681 Unsteadiness on feet: Secondary | ICD-10-CM | POA: Diagnosis not present

## 2022-08-24 DIAGNOSIS — R278 Other lack of coordination: Secondary | ICD-10-CM

## 2022-08-24 DIAGNOSIS — M6281 Muscle weakness (generalized): Secondary | ICD-10-CM
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
## 2022-08-27 ENCOUNTER — Ambulatory Visit: Payer: BC Managed Care – PPO | Admitting: Occupational Therapy

## 2022-08-27 ENCOUNTER — Ambulatory Visit: Payer: BC Managed Care – PPO

## 2022-08-27 ENCOUNTER — Ambulatory Visit (INDEPENDENT_AMBULATORY_CARE_PROVIDER_SITE_OTHER): Payer: BC Managed Care – PPO | Admitting: Psychology

## 2022-08-27 ENCOUNTER — Encounter: Payer: Self-pay | Admitting: Occupational Therapy

## 2022-08-27 DIAGNOSIS — R29898 Other symptoms and signs involving the musculoskeletal system: Secondary | ICD-10-CM

## 2022-08-27 DIAGNOSIS — R2681 Unsteadiness on feet: Secondary | ICD-10-CM | POA: Diagnosis not present

## 2022-08-27 DIAGNOSIS — F4322 Adjustment disorder with anxiety: Secondary | ICD-10-CM

## 2022-08-27 DIAGNOSIS — R208 Other disturbances of skin sensation: Secondary | ICD-10-CM

## 2022-08-27 DIAGNOSIS — T07XXXA Unspecified multiple injuries, initial encounter: Secondary | ICD-10-CM

## 2022-08-27 DIAGNOSIS — M6281 Muscle weakness (generalized): Secondary | ICD-10-CM

## 2022-08-27 DIAGNOSIS — R278 Other lack of coordination: Secondary | ICD-10-CM
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
## 2022-08-28 ENCOUNTER — Ambulatory Visit: Payer: BC Managed Care – PPO | Admitting: Rehabilitation

## 2022-08-28 ENCOUNTER — Encounter: Payer: Self-pay | Admitting: Rehabilitation

## 2022-08-28 DIAGNOSIS — R2689 Other abnormalities of gait and mobility: Secondary | ICD-10-CM

## 2022-08-28 DIAGNOSIS — R2681 Unsteadiness on feet: Secondary | ICD-10-CM

## 2022-08-28 DIAGNOSIS — M79604 Pain in right leg: Secondary | ICD-10-CM

## 2022-08-28 DIAGNOSIS — M6281 Muscle weakness (generalized): Secondary | ICD-10-CM

## 2022-08-28 DIAGNOSIS — R293 Abnormal posture: Secondary | ICD-10-CM
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
## 2022-08-29 ENCOUNTER — Ambulatory Visit: Payer: BC Managed Care – PPO | Admitting: Physical Therapy

## 2022-08-29 ENCOUNTER — Ambulatory Visit: Payer: BC Managed Care – PPO | Admitting: Rehabilitative and Restorative Service Providers"

## 2022-08-30 ENCOUNTER — Ambulatory Visit: Payer: BC Managed Care – PPO

## 2022-08-30 DIAGNOSIS — R2689 Other abnormalities of gait and mobility: Secondary | ICD-10-CM

## 2022-08-30 DIAGNOSIS — R2681 Unsteadiness on feet: Secondary | ICD-10-CM | POA: Diagnosis not present

## 2022-08-30 DIAGNOSIS — R293 Abnormal posture: Secondary | ICD-10-CM

## 2022-08-30 DIAGNOSIS — R278 Other lack of coordination: Secondary | ICD-10-CM

## 2022-08-30 DIAGNOSIS — M6281 Muscle weakness (generalized): Secondary | ICD-10-CM
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
## 2022-08-31 ENCOUNTER — Other Ambulatory Visit: Payer: Self-pay | Admitting: Registered Nurse

## 2022-08-31 ENCOUNTER — Ambulatory Visit: Payer: BC Managed Care – PPO | Admitting: Physical Therapy

## 2022-08-31 DIAGNOSIS — R2681 Unsteadiness on feet: Secondary | ICD-10-CM | POA: Diagnosis not present

## 2022-08-31 DIAGNOSIS — R2689 Other abnormalities of gait and mobility: Secondary | ICD-10-CM

## 2022-08-31 DIAGNOSIS — M6281 Muscle weakness (generalized): Secondary | ICD-10-CM
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
## 2022-09-03 ENCOUNTER — Ambulatory Visit: Payer: BC Managed Care – PPO | Admitting: Physical Therapy

## 2022-09-03 DIAGNOSIS — R2689 Other abnormalities of gait and mobility: Secondary | ICD-10-CM

## 2022-09-03 DIAGNOSIS — M6281 Muscle weakness (generalized): Secondary | ICD-10-CM

## 2022-09-03 DIAGNOSIS — R2681 Unsteadiness on feet: Secondary | ICD-10-CM | POA: Diagnosis not present
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
## 2022-09-04 ENCOUNTER — Ambulatory Visit: Payer: BC Managed Care – PPO | Admitting: Rehabilitation

## 2022-09-04 ENCOUNTER — Encounter: Payer: Self-pay | Admitting: Rehabilitation

## 2022-09-04 DIAGNOSIS — R293 Abnormal posture: Secondary | ICD-10-CM

## 2022-09-04 DIAGNOSIS — R2681 Unsteadiness on feet: Secondary | ICD-10-CM

## 2022-09-04 DIAGNOSIS — R2689 Other abnormalities of gait and mobility: Secondary | ICD-10-CM

## 2022-09-04 DIAGNOSIS — M79604 Pain in right leg: Secondary | ICD-10-CM

## 2022-09-04 DIAGNOSIS — M6281 Muscle weakness (generalized): Secondary | ICD-10-CM
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
## 2022-09-05 ENCOUNTER — Ambulatory Visit: Payer: BC Managed Care – PPO

## 2022-09-05 ENCOUNTER — Ambulatory Visit: Payer: BC Managed Care – PPO | Admitting: Rehabilitative and Restorative Service Providers"

## 2022-09-05 ENCOUNTER — Encounter: Payer: Self-pay | Admitting: Rehabilitative and Restorative Service Providers"

## 2022-09-05 ENCOUNTER — Encounter: Payer: BC Managed Care – PPO | Attending: Registered Nurse | Admitting: Physical Medicine & Rehabilitation

## 2022-09-05 ENCOUNTER — Ambulatory Visit
Admission: RE | Admit: 2022-09-05 | Discharge: 2022-09-05 | Disposition: A | Discharge status: Home / Self Care | Payer: BC Managed Care – PPO | Source: Ambulatory Visit | Attending: Physical Medicine & Rehabilitation | Admitting: Physical Medicine & Rehabilitation

## 2022-09-05 ENCOUNTER — Encounter: Payer: Self-pay | Admitting: Physical Medicine & Rehabilitation

## 2022-09-05 VITALS — BP 118/64 | HR 112 | Ht 62.0 in

## 2022-09-05 DIAGNOSIS — S7400XS Injury of sciatic nerve at hip and thigh level, unspecified leg, sequela: Secondary | ICD-10-CM | POA: Diagnosis present

## 2022-09-05 DIAGNOSIS — R2681 Unsteadiness on feet: Secondary | ICD-10-CM | POA: Diagnosis not present

## 2022-09-05 DIAGNOSIS — S7400XA Injury of sciatic nerve at hip and thigh level, unspecified leg, initial encounter: Secondary | ICD-10-CM

## 2022-09-05 DIAGNOSIS — S7400XD Injury of sciatic nerve at hip and thigh level, unspecified leg, subsequent encounter: Secondary | ICD-10-CM | POA: Diagnosis not present

## 2022-09-05 DIAGNOSIS — M25572 Pain in left ankle and joints of left foot: Secondary | ICD-10-CM | POA: Diagnosis not present

## 2022-09-05 DIAGNOSIS — T07XXXA Unspecified multiple injuries, initial encounter: Secondary | ICD-10-CM | POA: Diagnosis not present

## 2022-09-05 DIAGNOSIS — R278 Other lack of coordination: Secondary | ICD-10-CM

## 2022-09-05 DIAGNOSIS — R52 Pain, unspecified: Secondary | ICD-10-CM | POA: Insufficient documentation

## 2022-09-05 DIAGNOSIS — M6281 Muscle weakness (generalized): Secondary | ICD-10-CM

## 2022-09-05 MED ORDER — PREGABALIN 75 MG PO CAPS: 75.0000 mg | ORAL_CAPSULE | Freq: Two times a day (BID) | ORAL | 2 refills | Status: DC

## 2022-09-05 MED ORDER — TRAZODONE HCL 50 MG PO TABS: 50.0000 mg | ORAL_TABLET | Freq: Every day | ORAL | 2 refills | Status: DC
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
## 2022-09-05 NOTE — Patient Instructions (Addendum)
ALWAYS FEEL FREE TO CALL OUR OFFICE WITH ANY PROBLEMS OR QUESTIONS [phone])  **PLEASE NOTE** ALL MEDICATION REFILL REQUESTS (INCLUDING CONTROLLED SUBSTANCES) NEED TO BE MADE AT LEAST 7 DAYS PRIOR TO REFILL BEING DUE. ANY REFILL REQUESTS INSIDE THAT TIME FRAME MAY RESULT IN DELAYS IN RECEIVING YOUR PRESCRIPTION.    RETURN TO DRIVING PLAN:  WITH THE SUPERVISION OF A LICENSED DRIVER, PLEASE DRIVE IN AN EMPTY PARKING LOT FOR AT LEAST 2-3 TRIALS TO TEST REACTION TIME, VISION, USE OF EQUIPMENT IN CAR, ETC.  IF SUCCESSFUL WITH THE PARKING LOT DRIVING, PROCEED TO SUPERVISED DRIVING TRIALS IN YOUR NEIGHBORHOOD STREETS AT LOW TRAFFIC TIMES TO TEST OBSERVATION TO TRAFFIC SIGNALS, REACTION TIME, ETC. PLEASE ATTEMPT AT LEAST 2-3 TRIALS IN YOUR NEIGHBORHOOD.  IF NEIGHBORHOOD DRIVING IS SUCCESSFUL, YOU MAY PROCEED TO DRIVING IN BUSIER AREAS IN YOUR COMMUNITY WITH SUPERVISION OF A LICENSED DRIVER. PLEASE ATTEMPT AT LEAST 4-5 TRIALS.  IF COMMUNITY DRIVING IS SUCCESSFUL, YOU MAY PROCEED TO DRIVING ALONE, DURING THE DAY TIME, IN NON-PEAK TRAFFIC TIMES. YOU SHOULD DRIVE NO FURTHER THAN 25 MINUTES IN ONE DIRECTION. PLEASE DO NOT DRIVE IF YOU FEEL FATIGUED OR UNDER THE INFLUENCE OF MEDICATION.     LYRICA: UNTIL 150MG  ARE GONE---  TAKE 75MG  IN MORNING AND 150MG  AT NIGHT  THEREAFTER TAKE 75MG  TWICE DAILY.

## 2022-09-07 ENCOUNTER — Ambulatory Visit: Payer: BC Managed Care – PPO

## 2022-09-07 DIAGNOSIS — R278 Other lack of coordination: Secondary | ICD-10-CM

## 2022-09-07 DIAGNOSIS — R2681 Unsteadiness on feet: Secondary | ICD-10-CM | POA: Diagnosis not present

## 2022-09-07 DIAGNOSIS — R293 Abnormal posture: Secondary | ICD-10-CM

## 2022-09-07 DIAGNOSIS — R2689 Other abnormalities of gait and mobility: Secondary | ICD-10-CM

## 2022-09-07 DIAGNOSIS — M6281 Muscle weakness (generalized): Secondary | ICD-10-CM
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
## 2022-09-10 ENCOUNTER — Ambulatory Visit: Payer: BC Managed Care – PPO

## 2022-09-10 DIAGNOSIS — R2689 Other abnormalities of gait and mobility: Secondary | ICD-10-CM

## 2022-09-10 DIAGNOSIS — R2681 Unsteadiness on feet: Secondary | ICD-10-CM | POA: Diagnosis not present

## 2022-09-10 DIAGNOSIS — R293 Abnormal posture: Secondary | ICD-10-CM

## 2022-09-10 DIAGNOSIS — M6281 Muscle weakness (generalized): Secondary | ICD-10-CM

## 2022-09-10 DIAGNOSIS — R278 Other lack of coordination: Secondary | ICD-10-CM
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
## 2022-09-12 ENCOUNTER — Ambulatory Visit: Payer: BC Managed Care – PPO

## 2022-09-12 ENCOUNTER — Ambulatory Visit: Payer: BC Managed Care – PPO | Admitting: Physical Therapy

## 2022-09-12 ENCOUNTER — Ambulatory Visit: Payer: BC Managed Care – PPO | Admitting: Rehabilitative and Restorative Service Providers"

## 2022-09-12 ENCOUNTER — Encounter: Payer: Self-pay | Admitting: Rehabilitative and Restorative Service Providers"

## 2022-09-12 DIAGNOSIS — R2681 Unsteadiness on feet: Secondary | ICD-10-CM | POA: Diagnosis not present

## 2022-09-12 DIAGNOSIS — M6281 Muscle weakness (generalized): Secondary | ICD-10-CM

## 2022-09-12 DIAGNOSIS — R278 Other lack of coordination: Secondary | ICD-10-CM

## 2022-09-13 ENCOUNTER — Ambulatory Visit: Payer: BC Managed Care – PPO | Admitting: Physical Therapy
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
## 2022-09-17 ENCOUNTER — Ambulatory Visit: Payer: BC Managed Care – PPO | Attending: Physician Assistant

## 2022-09-17 ENCOUNTER — Encounter: Payer: Self-pay | Admitting: Rehabilitative and Restorative Service Providers"

## 2022-09-17 ENCOUNTER — Ambulatory Visit: Payer: BC Managed Care – PPO | Admitting: Rehabilitative and Restorative Service Providers"

## 2022-09-17 DIAGNOSIS — M6281 Muscle weakness (generalized): Secondary | ICD-10-CM | POA: Insufficient documentation

## 2022-09-17 DIAGNOSIS — R29898 Other symptoms and signs involving the musculoskeletal system: Secondary | ICD-10-CM | POA: Insufficient documentation

## 2022-09-17 DIAGNOSIS — R278 Other lack of coordination: Secondary | ICD-10-CM | POA: Insufficient documentation

## 2022-09-17 DIAGNOSIS — R208 Other disturbances of skin sensation: Secondary | ICD-10-CM

## 2022-09-17 DIAGNOSIS — R2689 Other abnormalities of gait and mobility: Secondary | ICD-10-CM | POA: Diagnosis present

## 2022-09-17 DIAGNOSIS — T07XXXA Unspecified multiple injuries, initial encounter: Secondary | ICD-10-CM | POA: Diagnosis present

## 2022-09-17 DIAGNOSIS — R293 Abnormal posture: Secondary | ICD-10-CM | POA: Diagnosis present

## 2022-09-17 DIAGNOSIS — R2681 Unsteadiness on feet: Secondary | ICD-10-CM | POA: Insufficient documentation
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
## 2022-09-19 ENCOUNTER — Ambulatory Visit: Payer: BC Managed Care – PPO

## 2022-09-21 ENCOUNTER — Ambulatory Visit: Payer: BC Managed Care – PPO | Admitting: Occupational Therapy

## 2022-09-21 ENCOUNTER — Ambulatory Visit: Payer: BC Managed Care – PPO

## 2022-09-21 ENCOUNTER — Encounter: Payer: Self-pay | Admitting: Occupational Therapy

## 2022-09-21 DIAGNOSIS — R2689 Other abnormalities of gait and mobility: Secondary | ICD-10-CM

## 2022-09-21 DIAGNOSIS — R278 Other lack of coordination: Secondary | ICD-10-CM

## 2022-09-21 DIAGNOSIS — M6281 Muscle weakness (generalized): Secondary | ICD-10-CM

## 2022-09-21 DIAGNOSIS — R293 Abnormal posture: Secondary | ICD-10-CM

## 2022-09-21 DIAGNOSIS — R208 Other disturbances of skin sensation: Secondary | ICD-10-CM

## 2022-09-21 DIAGNOSIS — R29898 Other symptoms and signs involving the musculoskeletal system: Secondary | ICD-10-CM

## 2022-09-21 DIAGNOSIS — T07XXXA Unspecified multiple injuries, initial encounter: Secondary | ICD-10-CM

## 2022-09-21 DIAGNOSIS — R2681 Unsteadiness on feet: Secondary | ICD-10-CM
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
## 2022-09-24 ENCOUNTER — Ambulatory Visit (INDEPENDENT_AMBULATORY_CARE_PROVIDER_SITE_OTHER): Payer: BC Managed Care – PPO | Admitting: Psychology

## 2022-09-24 ENCOUNTER — Encounter: Payer: Self-pay | Admitting: Occupational Therapy

## 2022-09-24 ENCOUNTER — Ambulatory Visit: Payer: BC Managed Care – PPO | Admitting: Physical Therapy

## 2022-09-24 ENCOUNTER — Ambulatory Visit: Payer: BC Managed Care – PPO | Admitting: Occupational Therapy

## 2022-09-24 DIAGNOSIS — F4322 Adjustment disorder with anxiety: Secondary | ICD-10-CM | POA: Diagnosis not present

## 2022-09-24 DIAGNOSIS — M6281 Muscle weakness (generalized): Secondary | ICD-10-CM | POA: Diagnosis not present

## 2022-09-24 DIAGNOSIS — T07XXXA Unspecified multiple injuries, initial encounter: Secondary | ICD-10-CM

## 2022-09-24 DIAGNOSIS — R29898 Other symptoms and signs involving the musculoskeletal system: Secondary | ICD-10-CM

## 2022-09-24 DIAGNOSIS — R278 Other lack of coordination: Secondary | ICD-10-CM

## 2022-09-24 DIAGNOSIS — R2689 Other abnormalities of gait and mobility: Secondary | ICD-10-CM

## 2022-09-24 DIAGNOSIS — R2681 Unsteadiness on feet: Secondary | ICD-10-CM
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
## 2022-09-26 ENCOUNTER — Ambulatory Visit: Payer: BC Managed Care – PPO | Admitting: Physical Therapy

## 2022-09-28 ENCOUNTER — Encounter: Payer: Self-pay | Admitting: Occupational Therapy

## 2022-09-28 ENCOUNTER — Ambulatory Visit: Payer: BC Managed Care – PPO

## 2022-09-28 ENCOUNTER — Ambulatory Visit: Payer: BC Managed Care – PPO | Admitting: Occupational Therapy

## 2022-09-28 DIAGNOSIS — R29898 Other symptoms and signs involving the musculoskeletal system: Secondary | ICD-10-CM

## 2022-09-28 DIAGNOSIS — M6281 Muscle weakness (generalized): Secondary | ICD-10-CM

## 2022-09-28 DIAGNOSIS — R2681 Unsteadiness on feet: Secondary | ICD-10-CM

## 2022-09-28 DIAGNOSIS — R278 Other lack of coordination: Secondary | ICD-10-CM

## 2022-09-28 DIAGNOSIS — R2689 Other abnormalities of gait and mobility: Secondary | ICD-10-CM

## 2022-09-28 DIAGNOSIS — T07XXXA Unspecified multiple injuries, initial encounter: Secondary | ICD-10-CM
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
## 2022-10-01 ENCOUNTER — Ambulatory Visit: Payer: BC Managed Care – PPO | Admitting: Physical Therapy

## 2022-10-01 ENCOUNTER — Encounter: Payer: Self-pay | Admitting: Occupational Therapy

## 2022-10-01 ENCOUNTER — Ambulatory Visit: Payer: BC Managed Care – PPO | Admitting: Occupational Therapy

## 2022-10-01 DIAGNOSIS — R29898 Other symptoms and signs involving the musculoskeletal system: Secondary | ICD-10-CM

## 2022-10-01 DIAGNOSIS — R2689 Other abnormalities of gait and mobility: Secondary | ICD-10-CM

## 2022-10-01 DIAGNOSIS — R278 Other lack of coordination: Secondary | ICD-10-CM

## 2022-10-01 DIAGNOSIS — R208 Other disturbances of skin sensation: Secondary | ICD-10-CM

## 2022-10-01 DIAGNOSIS — M6281 Muscle weakness (generalized): Secondary | ICD-10-CM | POA: Diagnosis not present

## 2022-10-01 DIAGNOSIS — T07XXXA Unspecified multiple injuries, initial encounter: Secondary | ICD-10-CM
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
## 2022-10-03 ENCOUNTER — Ambulatory Visit: Payer: BC Managed Care – PPO | Admitting: Physical Therapy

## 2022-10-05 ENCOUNTER — Encounter: Payer: Self-pay | Admitting: Occupational Therapy

## 2022-10-05 ENCOUNTER — Ambulatory Visit: Payer: BC Managed Care – PPO | Admitting: Occupational Therapy

## 2022-10-05 ENCOUNTER — Ambulatory Visit: Payer: BC Managed Care – PPO

## 2022-10-05 DIAGNOSIS — R2689 Other abnormalities of gait and mobility: Secondary | ICD-10-CM

## 2022-10-05 DIAGNOSIS — R293 Abnormal posture: Secondary | ICD-10-CM

## 2022-10-05 DIAGNOSIS — R29898 Other symptoms and signs involving the musculoskeletal system: Secondary | ICD-10-CM

## 2022-10-05 DIAGNOSIS — R2681 Unsteadiness on feet: Secondary | ICD-10-CM

## 2022-10-05 DIAGNOSIS — T07XXXA Unspecified multiple injuries, initial encounter: Secondary | ICD-10-CM

## 2022-10-05 DIAGNOSIS — M6281 Muscle weakness (generalized): Secondary | ICD-10-CM | POA: Diagnosis not present

## 2022-10-05 DIAGNOSIS — R278 Other lack of coordination: Secondary | ICD-10-CM

## 2022-10-05 DIAGNOSIS — R208 Other disturbances of skin sensation: Secondary | ICD-10-CM
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
## 2022-10-08 ENCOUNTER — Ambulatory Visit: Payer: BC Managed Care – PPO

## 2022-10-08 DIAGNOSIS — R293 Abnormal posture: Secondary | ICD-10-CM

## 2022-10-08 DIAGNOSIS — R2689 Other abnormalities of gait and mobility: Secondary | ICD-10-CM

## 2022-10-08 DIAGNOSIS — R2681 Unsteadiness on feet: Secondary | ICD-10-CM

## 2022-10-08 DIAGNOSIS — R278 Other lack of coordination: Secondary | ICD-10-CM

## 2022-10-08 DIAGNOSIS — M6281 Muscle weakness (generalized): Secondary | ICD-10-CM | POA: Diagnosis not present
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
## 2022-10-11 ENCOUNTER — Telehealth: Payer: Self-pay | Admitting: *Deleted

## 2022-10-11 NOTE — Telephone Encounter (Signed)
Patient's mother left vm requesting update to FMLA previously filled out. Original return to work date is 10/16/22. Patient has a f/u here in office on 11/07/22 and scheduled to have surgery in June. Mother asking if date can be changed until after the surgery?

## 2022-10-12 ENCOUNTER — Ambulatory Visit: Payer: BC Managed Care – PPO | Admitting: Occupational Therapy

## 2022-10-12 ENCOUNTER — Ambulatory Visit: Payer: BC Managed Care – PPO

## 2022-10-12 DIAGNOSIS — R278 Other lack of coordination: Secondary | ICD-10-CM

## 2022-10-12 DIAGNOSIS — M6281 Muscle weakness (generalized): Secondary | ICD-10-CM

## 2022-10-12 DIAGNOSIS — R2681 Unsteadiness on feet: Secondary | ICD-10-CM

## 2022-10-12 DIAGNOSIS — R2689 Other abnormalities of gait and mobility: Secondary | ICD-10-CM

## 2022-10-12 DIAGNOSIS — R293 Abnormal posture: Secondary | ICD-10-CM

## 2022-10-12 NOTE — Telephone Encounter (Signed)
Left message for mother that letter is available and to let us know if she would like to pick up a stamped version or for him to actually sign the letter it will be available next Wednesday.

## 2022-10-12 NOTE — Telephone Encounter (Signed)
Per mother a letter with the addendum to the date will be fine. She will come by office and pick up once completed.
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
## 2022-10-12 NOTE — Telephone Encounter (Signed)
I wrote a letter. It can be stamped-signed or I can sign it next week in person. I changed her return to work date to 12/24/22

## 2022-10-15 ENCOUNTER — Ambulatory Visit: Payer: BC Managed Care – PPO

## 2022-10-15 DIAGNOSIS — R293 Abnormal posture: Secondary | ICD-10-CM

## 2022-10-15 DIAGNOSIS — R278 Other lack of coordination: Secondary | ICD-10-CM

## 2022-10-15 DIAGNOSIS — R2689 Other abnormalities of gait and mobility: Secondary | ICD-10-CM

## 2022-10-15 DIAGNOSIS — M6281 Muscle weakness (generalized): Secondary | ICD-10-CM

## 2022-10-15 DIAGNOSIS — R2681 Unsteadiness on feet: Secondary | ICD-10-CM
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
## 2022-10-19 ENCOUNTER — Ambulatory Visit: Payer: BC Managed Care – PPO | Attending: Physician Assistant

## 2022-10-19 DIAGNOSIS — R293 Abnormal posture: Secondary | ICD-10-CM | POA: Diagnosis present

## 2022-10-19 DIAGNOSIS — R2689 Other abnormalities of gait and mobility: Secondary | ICD-10-CM | POA: Insufficient documentation

## 2022-10-19 DIAGNOSIS — R278 Other lack of coordination: Secondary | ICD-10-CM | POA: Insufficient documentation

## 2022-10-19 DIAGNOSIS — M6281 Muscle weakness (generalized): Secondary | ICD-10-CM | POA: Diagnosis present

## 2022-10-19 DIAGNOSIS — R2681 Unsteadiness on feet: Secondary | ICD-10-CM | POA: Insufficient documentation
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
## 2022-10-22 ENCOUNTER — Ambulatory Visit: Payer: BC Managed Care – PPO

## 2022-10-22 ENCOUNTER — Ambulatory Visit: Payer: BC Managed Care – PPO | Admitting: Psychology

## 2022-10-22 DIAGNOSIS — R278 Other lack of coordination: Secondary | ICD-10-CM

## 2022-10-22 DIAGNOSIS — M6281 Muscle weakness (generalized): Secondary | ICD-10-CM

## 2022-10-22 DIAGNOSIS — R2681 Unsteadiness on feet: Secondary | ICD-10-CM

## 2022-10-22 DIAGNOSIS — R293 Abnormal posture: Secondary | ICD-10-CM

## 2022-10-22 DIAGNOSIS — R2689 Other abnormalities of gait and mobility: Secondary | ICD-10-CM
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
## 2022-10-26 ENCOUNTER — Ambulatory Visit: Payer: BC Managed Care – PPO

## 2022-10-26 DIAGNOSIS — M6281 Muscle weakness (generalized): Secondary | ICD-10-CM

## 2022-10-26 DIAGNOSIS — R293 Abnormal posture: Secondary | ICD-10-CM

## 2022-10-26 DIAGNOSIS — R278 Other lack of coordination: Secondary | ICD-10-CM

## 2022-10-26 DIAGNOSIS — R2681 Unsteadiness on feet: Secondary | ICD-10-CM

## 2022-10-26 DIAGNOSIS — R2689 Other abnormalities of gait and mobility: Secondary | ICD-10-CM
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
## 2022-10-29 ENCOUNTER — Ambulatory Visit: Payer: BC Managed Care – PPO

## 2022-10-29 DIAGNOSIS — R2689 Other abnormalities of gait and mobility: Secondary | ICD-10-CM

## 2022-10-29 DIAGNOSIS — R293 Abnormal posture: Secondary | ICD-10-CM

## 2022-10-29 DIAGNOSIS — R278 Other lack of coordination: Secondary | ICD-10-CM

## 2022-10-29 DIAGNOSIS — R2681 Unsteadiness on feet: Secondary | ICD-10-CM

## 2022-10-29 DIAGNOSIS — M6281 Muscle weakness (generalized): Secondary | ICD-10-CM | POA: Diagnosis not present
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
## 2022-11-02 ENCOUNTER — Ambulatory Visit: Payer: BC Managed Care – PPO

## 2022-11-02 DIAGNOSIS — M6281 Muscle weakness (generalized): Secondary | ICD-10-CM | POA: Diagnosis not present

## 2022-11-02 DIAGNOSIS — R2681 Unsteadiness on feet: Secondary | ICD-10-CM

## 2022-11-02 DIAGNOSIS — R2689 Other abnormalities of gait and mobility: Secondary | ICD-10-CM

## 2022-11-02 DIAGNOSIS — R278 Other lack of coordination: Secondary | ICD-10-CM

## 2022-11-02 DIAGNOSIS — R293 Abnormal posture: Secondary | ICD-10-CM
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
## 2022-11-05 ENCOUNTER — Ambulatory Visit: Payer: BC Managed Care – PPO | Admitting: Physical Therapy

## 2022-11-05 DIAGNOSIS — M6281 Muscle weakness (generalized): Secondary | ICD-10-CM | POA: Diagnosis not present

## 2022-11-05 DIAGNOSIS — R2689 Other abnormalities of gait and mobility: Secondary | ICD-10-CM

## 2022-11-05 DIAGNOSIS — R2681 Unsteadiness on feet: Secondary | ICD-10-CM
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
## 2022-11-07 ENCOUNTER — Encounter: Payer: Self-pay | Admitting: Physical Medicine & Rehabilitation

## 2022-11-07 ENCOUNTER — Encounter: Payer: BC Managed Care – PPO | Attending: Registered Nurse | Admitting: Physical Medicine & Rehabilitation

## 2022-11-07 DIAGNOSIS — S7400XS Injury of sciatic nerve at hip and thigh level, unspecified leg, sequela: Secondary | ICD-10-CM | POA: Insufficient documentation

## 2022-11-07 DIAGNOSIS — S7400XD Injury of sciatic nerve at hip and thigh level, unspecified leg, subsequent encounter: Secondary | ICD-10-CM | POA: Insufficient documentation

## 2022-11-07 MED ORDER — TRAZODONE HCL 100 MG PO TABS: 100.0000 mg | ORAL_TABLET | Freq: Every day | ORAL | 4 refills | Status: DC

## 2022-11-07 MED ORDER — PREGABALIN 50 MG PO CAPS: 50.0000 mg | ORAL_CAPSULE | Freq: Two times a day (BID) | ORAL | 3 refills | Status: DC
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
## 2022-11-07 NOTE — Patient Instructions (Signed)
LET'S WORK ON WORK ON LOWER EXTREMITY STRENGTHENING. TRY THE BIKE, POOL, ETC.    LYRICA 50MG  TWICE DAILY FOR 2 WEEKS THEN ONCE DAILY AT BEDTIME UNTIL BOTTLE'S EMPTY.    TRAZODONE 100MG  AT BEDTIME FOR SLEEP.

## 2022-11-19 ENCOUNTER — Ambulatory Visit (INDEPENDENT_AMBULATORY_CARE_PROVIDER_SITE_OTHER): Payer: BC Managed Care – PPO | Admitting: Psychology

## 2022-11-19 DIAGNOSIS — F4322 Adjustment disorder with anxiety: Secondary | ICD-10-CM | POA: Diagnosis not present
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
## 2022-11-26 ENCOUNTER — Other Ambulatory Visit: Payer: Self-pay | Admitting: Orthopedic Surgery

## 2022-11-26 DIAGNOSIS — S52022D Displaced fracture of olecranon process without intraarticular extension of left ulna, subsequent encounter for closed fracture with routine healing: Secondary | ICD-10-CM

## 2022-11-29 ENCOUNTER — Other Ambulatory Visit: Payer: Self-pay | Admitting: Physical Medicine & Rehabilitation

## 2022-11-29 DIAGNOSIS — S7400XS Injury of sciatic nerve at hip and thigh level, unspecified leg, sequela: Secondary | ICD-10-CM

## 2022-11-30 ENCOUNTER — Ambulatory Visit
Admission: RE | Admit: 2022-11-30 | Discharge: 2022-11-30 | Disposition: A | Discharge status: Home / Self Care | Payer: BC Managed Care – PPO | Source: Ambulatory Visit | Attending: Orthopedic Surgery | Admitting: Orthopedic Surgery

## 2022-11-30 DIAGNOSIS — S52022D Displaced fracture of olecranon process without intraarticular extension of left ulna, subsequent encounter for closed fracture with routine healing: Secondary | ICD-10-CM

## 2022-12-14 ENCOUNTER — Ambulatory Visit: Payer: BC Managed Care – PPO | Attending: Physician Assistant | Admitting: Physical Therapy

## 2022-12-14 DIAGNOSIS — M25561 Pain in right knee: Secondary | ICD-10-CM | POA: Diagnosis present

## 2022-12-14 DIAGNOSIS — R2681 Unsteadiness on feet: Secondary | ICD-10-CM

## 2022-12-14 DIAGNOSIS — R2689 Other abnormalities of gait and mobility: Secondary | ICD-10-CM

## 2022-12-14 DIAGNOSIS — M6281 Muscle weakness (generalized): Secondary | ICD-10-CM | POA: Diagnosis present

## 2022-12-14 DIAGNOSIS — G8929 Other chronic pain: Secondary | ICD-10-CM | POA: Diagnosis present
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
## 2022-12-19 ENCOUNTER — Ambulatory Visit: Payer: BC Managed Care – PPO | Admitting: Physical Therapy

## 2022-12-21 ENCOUNTER — Ambulatory Visit: Payer: BC Managed Care – PPO | Admitting: Physical Therapy

## 2022-12-21 ENCOUNTER — Encounter: Payer: Self-pay | Admitting: Physical Therapy

## 2022-12-21 ENCOUNTER — Ambulatory Visit: Payer: BC Managed Care – PPO | Attending: Physician Assistant | Admitting: Physical Therapy

## 2022-12-21 VITALS — BP 115/72 | HR 76

## 2022-12-21 DIAGNOSIS — G8929 Other chronic pain: Secondary | ICD-10-CM | POA: Diagnosis present

## 2022-12-21 DIAGNOSIS — M6281 Muscle weakness (generalized): Secondary | ICD-10-CM | POA: Diagnosis present

## 2022-12-21 DIAGNOSIS — R2681 Unsteadiness on feet: Secondary | ICD-10-CM | POA: Insufficient documentation

## 2022-12-21 DIAGNOSIS — M25561 Pain in right knee: Secondary | ICD-10-CM | POA: Diagnosis present

## 2022-12-21 DIAGNOSIS — R2689 Other abnormalities of gait and mobility: Secondary | ICD-10-CM | POA: Diagnosis present
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
## 2022-12-24 ENCOUNTER — Ambulatory Visit (INDEPENDENT_AMBULATORY_CARE_PROVIDER_SITE_OTHER): Payer: BC Managed Care – PPO | Admitting: Psychology

## 2022-12-24 DIAGNOSIS — F4322 Adjustment disorder with anxiety: Secondary | ICD-10-CM | POA: Diagnosis not present
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
## 2022-12-26 ENCOUNTER — Ambulatory Visit: Payer: BC Managed Care – PPO | Admitting: Physical Therapy

## 2022-12-26 DIAGNOSIS — R2689 Other abnormalities of gait and mobility: Secondary | ICD-10-CM

## 2022-12-26 DIAGNOSIS — M6281 Muscle weakness (generalized): Secondary | ICD-10-CM

## 2022-12-26 DIAGNOSIS — R2681 Unsteadiness on feet: Secondary | ICD-10-CM

## 2022-12-26 DIAGNOSIS — G8929 Other chronic pain: Secondary | ICD-10-CM
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
## 2022-12-31 ENCOUNTER — Encounter: Payer: Self-pay | Admitting: Physical Therapy

## 2022-12-31 ENCOUNTER — Ambulatory Visit: Payer: BC Managed Care – PPO | Admitting: Physical Therapy

## 2022-12-31 VITALS — BP 128/61 | HR 92

## 2022-12-31 DIAGNOSIS — R2689 Other abnormalities of gait and mobility: Secondary | ICD-10-CM | POA: Diagnosis not present

## 2022-12-31 DIAGNOSIS — R2681 Unsteadiness on feet: Secondary | ICD-10-CM

## 2022-12-31 DIAGNOSIS — M6281 Muscle weakness (generalized): Secondary | ICD-10-CM

## 2022-12-31 DIAGNOSIS — G8929 Other chronic pain: Secondary | ICD-10-CM
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
## 2023-01-07 ENCOUNTER — Ambulatory Visit: Payer: BC Managed Care – PPO | Admitting: Physical Therapy

## 2023-01-07 DIAGNOSIS — M6281 Muscle weakness (generalized): Secondary | ICD-10-CM

## 2023-01-07 DIAGNOSIS — R2681 Unsteadiness on feet: Secondary | ICD-10-CM

## 2023-01-07 DIAGNOSIS — R2689 Other abnormalities of gait and mobility: Secondary | ICD-10-CM
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
## 2023-01-14 ENCOUNTER — Ambulatory Visit: Payer: BC Managed Care – PPO | Admitting: Physical Therapy

## 2023-01-14 DIAGNOSIS — R2689 Other abnormalities of gait and mobility: Secondary | ICD-10-CM | POA: Diagnosis not present

## 2023-01-14 DIAGNOSIS — G8929 Other chronic pain: Secondary | ICD-10-CM

## 2023-01-14 DIAGNOSIS — M6281 Muscle weakness (generalized): Secondary | ICD-10-CM

## 2023-01-14 DIAGNOSIS — R2681 Unsteadiness on feet: Secondary | ICD-10-CM
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
## 2023-01-21 ENCOUNTER — Ambulatory Visit: Payer: BC Managed Care – PPO | Attending: Orthopedic Surgery | Admitting: Physical Therapy

## 2023-01-21 ENCOUNTER — Encounter (HOSPITAL_COMMUNITY): Payer: Self-pay | Admitting: Orthopedic Surgery

## 2023-01-21 ENCOUNTER — Other Ambulatory Visit: Payer: Self-pay

## 2023-01-21 DIAGNOSIS — R2681 Unsteadiness on feet: Secondary | ICD-10-CM

## 2023-01-21 DIAGNOSIS — G8929 Other chronic pain: Secondary | ICD-10-CM

## 2023-01-21 DIAGNOSIS — R2689 Other abnormalities of gait and mobility: Secondary | ICD-10-CM

## 2023-01-21 DIAGNOSIS — M6281 Muscle weakness (generalized): Secondary | ICD-10-CM

## 2023-01-21 DIAGNOSIS — M25561 Pain in right knee: Secondary | ICD-10-CM | POA: Diagnosis present
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
## 2023-01-21 NOTE — Progress Notes (Signed)
Spoke with mother Pernie Bagdasaryan for information and instructions for DOS.  PCP - Benny Lennert, PA Cardiologist - n/a Behavioral Health - Salomon Fick, LCSW  Chest x-ray - 06/11/22 (1V) EKG - n/a Stress Test - n/a ECHO - 06/03/22 Cardiac Cath - n/a  ICD Pacemaker/Loop - n/a  Sleep Study -  n/a CPAP - none  Diabetes - n/a  ERAS: Clear liquids til 5 AM DOS.  STOP now taking any Aspirin (unless otherwise instructed by your surgeon), Aleve, Naproxen, Ibuprofen, Motrin, Advil, Goody's, BC's, all herbal medications, fish oil, and all vitamins.   Coronavirus Screening Do you have any of the following symptoms:  Cough yes/no: No Fever (>100.87F)  yes/no: No Runny nose yes/no: No Sore throat yes/no: No Difficulty breathing/shortness of breath  yes/no: No  Have you traveled in the last 14 days and where? yes/no: No  Patient's Mother Maycel Vanscoyk verbalized understanding of instructions that were given via phone.
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
## 2023-01-22 ENCOUNTER — Ambulatory Visit (HOSPITAL_COMMUNITY): Payer: BC Managed Care – PPO

## 2023-01-22 ENCOUNTER — Other Ambulatory Visit: Payer: Self-pay

## 2023-01-22 ENCOUNTER — Ambulatory Visit (HOSPITAL_COMMUNITY)
Admission: RE | Admit: 2023-01-22 | Discharge: 2023-01-22 | Disposition: A | Discharge status: Home / Self Care | Payer: BC Managed Care – PPO | Attending: Orthopedic Surgery | Admitting: Orthopedic Surgery

## 2023-01-22 ENCOUNTER — Encounter (HOSPITAL_COMMUNITY)
Admission: RE | Disposition: A | Discharge status: Home / Self Care | Payer: Self-pay | Source: Home / Self Care | Attending: Orthopedic Surgery

## 2023-01-22 ENCOUNTER — Encounter (HOSPITAL_COMMUNITY): Payer: Self-pay | Admitting: Orthopedic Surgery

## 2023-01-22 ENCOUNTER — Ambulatory Visit (HOSPITAL_COMMUNITY): Payer: Self-pay

## 2023-01-22 DIAGNOSIS — S52022P Displaced fracture of olecranon process without intraarticular extension of left ulna, subsequent encounter for closed fracture with malunion: Secondary | ICD-10-CM | POA: Diagnosis present

## 2023-01-22 DIAGNOSIS — M24522 Contracture, left elbow: Secondary | ICD-10-CM | POA: Insufficient documentation

## 2023-01-22 DIAGNOSIS — X58XXXD Exposure to other specified factors, subsequent encounter: Secondary | ICD-10-CM | POA: Diagnosis not present

## 2023-01-22 DIAGNOSIS — T8484XA Pain due to internal orthopedic prosthetic devices, implants and grafts, initial encounter: Secondary | ICD-10-CM | POA: Diagnosis not present

## 2023-01-22 HISTORY — PX: HARDWARE REMOVAL: SHX979

## 2023-01-22 HISTORY — DX: Anxiety disorder, unspecified: F41.9

## 2023-01-22 LAB — CBC
HCT: 45.5 % (ref 36.0–46.0)
Hemoglobin: 14.5 g/dL (ref 12.0–15.0)
MCH: 27.8 pg (ref 26.0–34.0)
MCHC: 31.9 g/dL (ref 30.0–36.0)
MCV: 87.3 fL (ref 80.0–100.0)
Platelets: 311 10*3/uL (ref 150–400)
RBC: 5.21 MIL/uL — ABNORMAL HIGH (ref 3.87–5.11)
RDW: 13.2 % (ref 11.5–15.5)
WBC: 8.3 10*3/uL (ref 4.0–10.5)
nRBC: 0 % (ref 0.0–0.2)

## 2023-01-22 LAB — POCT PREGNANCY, URINE: Preg Test, Ur: NEGATIVE

## 2023-01-22 SURGERY — REMOVAL, HARDWARE
Anesthesia: General | Site: Elbow | Laterality: Left

## 2023-01-22 MED ORDER — PHENYLEPHRINE 80 MCG/ML (10ML) SYRINGE FOR IV PUSH (FOR BLOOD PRESSURE SUPPORT)
PREFILLED_SYRINGE | INTRAVENOUS | Status: AC
  Filled 2023-01-22: qty 10

## 2023-01-22 MED ORDER — DEXAMETHASONE SODIUM PHOSPHATE 10 MG/ML IJ SOLN
INTRAMUSCULAR | Status: AC
  Filled 2023-01-22: qty 1

## 2023-01-22 MED ORDER — ROCURONIUM BROMIDE 100 MG/10ML IV SOLN
INTRAVENOUS | Status: DC | PRN
  Administered 2023-01-22: 10 mg via INTRAVENOUS
  Administered 2023-01-22: 60 mg via INTRAVENOUS

## 2023-01-22 MED ORDER — CHLORHEXIDINE GLUCONATE 0.12 % MT SOLN
15.0000 mL | Freq: Once | OROMUCOSAL | Status: AC
  Administered 2023-01-22: 15 mL via OROMUCOSAL
  Filled 2023-01-22: qty 15

## 2023-01-22 MED ORDER — FENTANYL CITRATE (PF) 100 MCG/2ML IJ SOLN
INTRAMUSCULAR | Status: AC
  Filled 2023-01-22: qty 2

## 2023-01-22 MED ORDER — PROPOFOL 10 MG/ML IV BOLUS
INTRAVENOUS | Status: DC | PRN
  Administered 2023-01-22: 160 mg via INTRAVENOUS

## 2023-01-22 MED ORDER — ACETAMINOPHEN 500 MG PO TABS: 1000.0000 mg | ORAL_TABLET | Freq: Once | ORAL | Status: DC | PRN

## 2023-01-22 MED ORDER — ONDANSETRON 4 MG PO TBDP: 4.0000 mg | ORAL_TABLET | Freq: Three times a day (TID) | ORAL | 0 refills | Status: DC | PRN

## 2023-01-22 MED ORDER — METHOCARBAMOL 500 MG PO TABS: 500.0000 mg | ORAL_TABLET | Freq: Three times a day (TID) | ORAL | 1 refills | Status: DC | PRN

## 2023-01-22 MED ORDER — LACTATED RINGERS IV SOLN: INTRAVENOUS | Status: DC

## 2023-01-22 MED ORDER — ACETAMINOPHEN 10 MG/ML IV SOLN: 1000.0000 mg | Freq: Once | INTRAVENOUS | Status: DC | PRN

## 2023-01-22 MED ORDER — ACETAMINOPHEN 160 MG/5ML PO SOLN: 1000.0000 mg | Freq: Once | ORAL | Status: DC | PRN

## 2023-01-22 MED ORDER — ONDANSETRON HCL 4 MG/2ML IJ SOLN
INTRAMUSCULAR | Status: AC
  Filled 2023-01-22: qty 2

## 2023-01-22 MED ORDER — KETOROLAC TROMETHAMINE 30 MG/ML IJ SOLN
INTRAMUSCULAR | Status: AC
  Filled 2023-01-22: qty 1

## 2023-01-22 MED ORDER — SUGAMMADEX SODIUM 200 MG/2ML IV SOLN
INTRAVENOUS | Status: DC | PRN
  Administered 2023-01-22: 200 mg via INTRAVENOUS

## 2023-01-22 MED ORDER — PROPOFOL 10 MG/ML IV BOLUS
INTRAVENOUS | Status: AC
  Filled 2023-01-22: qty 20

## 2023-01-22 MED ORDER — 0.9 % SODIUM CHLORIDE (POUR BTL) OPTIME
TOPICAL | Status: DC | PRN
  Administered 2023-01-22: 1000 mL

## 2023-01-22 MED ORDER — KETOROLAC TROMETHAMINE 10 MG PO TABS: 10.0000 mg | ORAL_TABLET | Freq: Four times a day (QID) | ORAL | 0 refills | Status: DC | PRN

## 2023-01-22 MED ORDER — ONDANSETRON HCL 4 MG/2ML IJ SOLN
INTRAMUSCULAR | Status: DC | PRN
Start: 2023-01-22 — End: 2023-01-22
  Administered 2023-01-22 (×2): 4 mg via INTRAVENOUS

## 2023-01-22 MED ORDER — BUPIVACAINE-MELOXICAM ER 400-12 MG/14ML IJ SOLN
INTRAMUSCULAR | Status: DC | PRN
  Administered 2023-01-22: 400 mg

## 2023-01-22 MED ORDER — MIDAZOLAM HCL 5 MG/5ML IJ SOLN
INTRAMUSCULAR | Status: DC | PRN
  Administered 2023-01-22: 2 mg via INTRAVENOUS

## 2023-01-22 MED ORDER — FENTANYL CITRATE (PF) 250 MCG/5ML IJ SOLN
INTRAMUSCULAR | Status: DC | PRN
  Administered 2023-01-22: 100 ug via INTRAVENOUS
  Administered 2023-01-22: 50 ug via INTRAVENOUS

## 2023-01-22 MED ORDER — DEXAMETHASONE SODIUM PHOSPHATE 10 MG/ML IJ SOLN
INTRAMUSCULAR | Status: DC | PRN
  Administered 2023-01-22: 10 mg via INTRAVENOUS

## 2023-01-22 MED ORDER — KETOROLAC TROMETHAMINE 15 MG/ML IJ SOLN
INTRAMUSCULAR | Status: DC | PRN
  Administered 2023-01-22: 15 mg via INTRAVENOUS

## 2023-01-22 MED ORDER — CEFAZOLIN SODIUM-DEXTROSE 2-4 GM/100ML-% IV SOLN
2.0000 g | INTRAVENOUS | Status: AC
  Administered 2023-01-22: 2 g via INTRAVENOUS
  Filled 2023-01-22: qty 100

## 2023-01-22 MED ORDER — OXYCODONE-ACETAMINOPHEN 5-325 MG PO TABS: 1.0000 | ORAL_TABLET | Freq: Three times a day (TID) | ORAL | 0 refills | Status: DC | PRN

## 2023-01-22 MED ORDER — ACETAMINOPHEN 10 MG/ML IV SOLN
INTRAVENOUS | Status: AC
  Filled 2023-01-22: qty 100

## 2023-01-22 MED ORDER — FENTANYL CITRATE (PF) 250 MCG/5ML IJ SOLN
INTRAMUSCULAR | Status: AC
  Filled 2023-01-22: qty 5

## 2023-01-22 MED ORDER — OXYCODONE HCL 5 MG PO TABS: 5.0000 mg | ORAL_TABLET | Freq: Once | ORAL | Status: AC | PRN

## 2023-01-22 MED ORDER — FENTANYL CITRATE (PF) 100 MCG/2ML IJ SOLN
25.0000 ug | INTRAMUSCULAR | Status: AC | PRN
  Administered 2023-01-22: 50 ug via INTRAVENOUS
  Administered 2023-01-22 (×5): 25 ug via INTRAVENOUS

## 2023-01-22 MED ORDER — OXYCODONE HCL 5 MG/5ML PO SOLN: 5.0000 mg | Freq: Once | ORAL | Status: AC | PRN

## 2023-01-22 MED ORDER — OXYCODONE HCL 5 MG PO TABS
ORAL_TABLET | ORAL | Status: AC
  Administered 2023-01-22: 5 mg via ORAL
  Filled 2023-01-22: qty 1

## 2023-01-22 MED ORDER — BUPIVACAINE-MELOXICAM ER 400-12 MG/14ML IJ SOLN
INTRAMUSCULAR | Status: AC
  Filled 2023-01-22: qty 1

## 2023-01-22 MED ORDER — ACETAMINOPHEN 10 MG/ML IV SOLN
INTRAVENOUS | Status: DC | PRN
Start: 2023-01-22 — End: 2023-01-22
  Administered 2023-01-22: 1000 mg via INTRAVENOUS

## 2023-01-22 MED ORDER — LIDOCAINE HCL (CARDIAC) PF 100 MG/5ML IV SOSY
PREFILLED_SYRINGE | INTRAVENOUS | Status: DC | PRN
  Administered 2023-01-22: 60 mg via INTRATRACHEAL

## 2023-01-22 MED ORDER — KETAMINE HCL 50 MG/5ML IJ SOSY
PREFILLED_SYRINGE | INTRAMUSCULAR | Status: AC
  Filled 2023-01-22: qty 5

## 2023-01-22 MED ORDER — MIDAZOLAM HCL 2 MG/2ML IJ SOLN
INTRAMUSCULAR | Status: AC
  Filled 2023-01-22: qty 2

## 2023-01-22 MED ORDER — KETAMINE HCL 10 MG/ML IJ SOLN
INTRAMUSCULAR | Status: DC | PRN
  Administered 2023-01-22: 30 mg via INTRAVENOUS

## 2023-01-22 MED ORDER — ORAL CARE MOUTH RINSE: 15.0000 mL | Freq: Once | OROMUCOSAL | Status: AC

## 2023-01-22 MED ORDER — PHENYLEPHRINE HCL (PRESSORS) 10 MG/ML IV SOLN
INTRAVENOUS | Status: DC | PRN
  Administered 2023-01-22: 80 ug via INTRAVENOUS
  Administered 2023-01-22 (×2): 160 ug via INTRAVENOUS
  Administered 2023-01-22: 80 ug via INTRAVENOUS

## 2023-01-22 SURGICAL SUPPLY — 76 items
BAG COUNTER SPONGE SURGICOUNT (BAG) ×2 IMPLANT
BAG SPNG CNTER NS LX DISP (BAG) ×1
BANDAGE ESMARK 6X9 LF (GAUZE/BANDAGES/DRESSINGS) ×2 IMPLANT
BNDG CMPR 5X3 KNIT ELC UNQ LF (GAUZE/BANDAGES/DRESSINGS) ×1
BNDG CMPR 5X4 KNIT ELC UNQ LF (GAUZE/BANDAGES/DRESSINGS) ×1
BNDG CMPR 5X6 CHSV STRCH STRL (GAUZE/BANDAGES/DRESSINGS)
BNDG CMPR 9X6 STRL LF SNTH (GAUZE/BANDAGES/DRESSINGS)
BNDG COHESIVE 6X5 TAN ST LF (GAUZE/BANDAGES/DRESSINGS) ×2 IMPLANT
BNDG ELASTIC 3INX 5YD STR LF (GAUZE/BANDAGES/DRESSINGS) IMPLANT
BNDG ELASTIC 4INX 5YD STR LF (GAUZE/BANDAGES/DRESSINGS) IMPLANT
BNDG ELASTIC 4X5.8 VLCR STR LF (GAUZE/BANDAGES/DRESSINGS) ×2 IMPLANT
BNDG ELASTIC 6X5.8 VLCR STR LF (GAUZE/BANDAGES/DRESSINGS) ×2 IMPLANT
BNDG ESMARK 6X9 LF (GAUZE/BANDAGES/DRESSINGS)
BNDG GAUZE DERMACEA FLUFF 4 (GAUZE/BANDAGES/DRESSINGS) ×4 IMPLANT
BNDG GZE DERMACEA 4 6PLY (GAUZE/BANDAGES/DRESSINGS) ×1
BRUSH SCRUB EZ PLAIN DRY (MISCELLANEOUS) ×4 IMPLANT
COVER SURGICAL LIGHT HANDLE (MISCELLANEOUS) ×4 IMPLANT
CUFF TOURN SGL QUICK 18X4 (TOURNIQUET CUFF) IMPLANT
CUFF TOURN SGL QUICK 24 (TOURNIQUET CUFF)
CUFF TOURN SGL QUICK 34 (TOURNIQUET CUFF)
CUFF TRNQT CYL 24X4X16.5-23 (TOURNIQUET CUFF) IMPLANT
CUFF TRNQT CYL 34X4.125X (TOURNIQUET CUFF) IMPLANT
DRAPE C-ARM 42X72 X-RAY (DRAPES) IMPLANT
DRAPE C-ARMOR (DRAPES) ×2 IMPLANT
DRAPE U-SHAPE 47X51 STRL (DRAPES) ×2 IMPLANT
DRSG ADAPTIC 3X8 NADH LF (GAUZE/BANDAGES/DRESSINGS) ×2 IMPLANT
DRSG MEPITEL 4X7.2 (GAUZE/BANDAGES/DRESSINGS) IMPLANT
ELECT REM PT RETURN 9FT ADLT (ELECTROSURGICAL) ×1
ELECTRODE REM PT RTRN 9FT ADLT (ELECTROSURGICAL) ×2 IMPLANT
GAUZE SPONGE 4X4 12PLY STRL (GAUZE/BANDAGES/DRESSINGS) ×2 IMPLANT
GLOVE BIO SURGEON STRL SZ7.5 (GLOVE) ×2 IMPLANT
GLOVE BIO SURGEON STRL SZ8 (GLOVE) ×2 IMPLANT
GLOVE BIOGEL PI IND STRL 7.5 (GLOVE) ×2 IMPLANT
GLOVE BIOGEL PI IND STRL 8 (GLOVE) ×2 IMPLANT
GLOVE SURG ORTHO LTX SZ7.5 (GLOVE) ×4 IMPLANT
GOWN STRL REUS W/ TWL LRG LVL3 (GOWN DISPOSABLE) ×4 IMPLANT
GOWN STRL REUS W/ TWL XL LVL3 (GOWN DISPOSABLE) ×2 IMPLANT
GOWN STRL REUS W/TWL LRG LVL3 (GOWN DISPOSABLE) ×2
GOWN STRL REUS W/TWL XL LVL3 (GOWN DISPOSABLE) ×1
KIT BASIN OR (CUSTOM PROCEDURE TRAY) ×2 IMPLANT
KIT TURNOVER KIT B (KITS) ×2 IMPLANT
MANIFOLD NEPTUNE II (INSTRUMENTS) ×2 IMPLANT
NDL 22X1.5 STRL (OR ONLY) (MISCELLANEOUS) IMPLANT
NEEDLE 22X1.5 STRL (OR ONLY) (MISCELLANEOUS) IMPLANT
NS IRRIG 1000ML POUR BTL (IV SOLUTION) ×2 IMPLANT
PACK ORTHO EXTREMITY (CUSTOM PROCEDURE TRAY) ×2 IMPLANT
PAD ARMBOARD 7.5X6 YLW CONV (MISCELLANEOUS) ×4 IMPLANT
PAD CAST 3X4 CTTN HI CHSV (CAST SUPPLIES) IMPLANT
PAD CAST 4YDX4 CTTN HI CHSV (CAST SUPPLIES) IMPLANT
PADDING CAST COTTON 3X4 STRL (CAST SUPPLIES) ×1
PADDING CAST COTTON 4X4 STRL (CAST SUPPLIES) ×1
PADDING CAST COTTON 6X4 STRL (CAST SUPPLIES) ×6 IMPLANT
SLING ARM FOAM STRAP LRG (SOFTGOODS) IMPLANT
SPONGE T-LAP 18X18 ~~LOC~~+RFID (SPONGE) ×2 IMPLANT
STAPLER VISISTAT 35W (STAPLE) IMPLANT
STOCKINETTE IMPERVIOUS LG (DRAPES) ×2 IMPLANT
STRIP CLOSURE SKIN 1/2X4 (GAUZE/BANDAGES/DRESSINGS) IMPLANT
SUCTION TUBE FRAZIER 10FR DISP (SUCTIONS) IMPLANT
SUCTION TUBE FRAZIER 8FR DISP (SUCTIONS) IMPLANT
SUT ETHILON 2 0 FS 18 (SUTURE) IMPLANT
SUT PDS AB 2-0 CT1 27 (SUTURE) IMPLANT
SUT VIC AB 0 CT1 27 (SUTURE) ×1
SUT VIC AB 0 CT1 27XBRD ANBCTR (SUTURE) IMPLANT
SUT VIC AB 1 CT1 27 (SUTURE) ×1
SUT VIC AB 1 CT1 27XBRD ANBCTR (SUTURE) IMPLANT
SUT VIC AB 1 CTX 27 (SUTURE) IMPLANT
SUT VIC AB 2-0 CT1 27 (SUTURE) ×1
SUT VIC AB 2-0 CT1 TAPERPNT 27 (SUTURE) IMPLANT
SYR CONTROL 10ML LL (SYRINGE) IMPLANT
TOWEL GREEN STERILE (TOWEL DISPOSABLE) ×4 IMPLANT
TOWEL GREEN STERILE FF (TOWEL DISPOSABLE) ×4 IMPLANT
TUBE CONNECTING 12X1/4 (SUCTIONS) ×2 IMPLANT
UNDERPAD 30X36 HEAVY ABSORB (UNDERPADS AND DIAPERS) ×2 IMPLANT
WARMER LAPAROSCOPE (MISCELLANEOUS) IMPLANT
WATER STERILE IRR 1000ML POUR (IV SOLUTION) ×4 IMPLANT
YANKAUER SUCT BULB TIP NO VENT (SUCTIONS) ×2 IMPLANT

## 2023-01-22 NOTE — Transfer of Care (Signed)
Immediate Anesthesia Transfer of Care Note  Patient: Alyssa Lopez  Procedure(s) Performed: HARDWARE REMOVAL AND EXCISION OF BONE LEFT ELBOW (Left: Elbow)  Patient Location: PACU  Anesthesia Type:General  Level of Consciousness: awake, alert , and oriented  Airway & Oxygen Therapy: Patient Spontanous Breathing and Patient connected to face mask  Post-op Assessment: Report given to RN and Post -op Vital signs reviewed and stable  Post vital signs: Reviewed and stable  Last Vitals:  Vitals Value Taken Time  BP 130/70 01/22/23 1101  Temp    Pulse 96 01/22/23 1106  Resp 21 01/22/23 1106  SpO2 100 % 01/22/23 1106  Vitals shown include unfiled device data.  Last Pain:  Vitals:   01/22/23 1191  TempSrc:   PainSc: 0-No pain         Complications: No notable events documented.

## 2023-01-22 NOTE — Anesthesia Postprocedure Evaluation (Signed)
Anesthesia Post Note  Patient: Alyssa Lopez  Procedure(s) Performed: HARDWARE REMOVAL AND EXCISION OF BONE LEFT ELBOW (Left: Elbow)     Patient location during evaluation: PACU Anesthesia Type: General Level of consciousness: awake and alert Pain management: pain level controlled Vital Signs Assessment: post-procedure vital signs reviewed and stable Respiratory status: spontaneous breathing, nonlabored ventilation and respiratory function stable Cardiovascular status: blood pressure returned to baseline and stable Postop Assessment: no apparent nausea or vomiting Anesthetic complications: no  No notable events documented.  Last Vitals:  Vitals:   01/22/23 1145 01/22/23 1200  BP: 113/71 115/65  Pulse: 91 85  Resp: 12 12  Temp:  36.9 C  SpO2: 100% 97%    Last Pain:  Vitals:   01/22/23 1200  TempSrc:   PainSc: 3                  ,W. EDMOND

## 2023-01-22 NOTE — Anesthesia Procedure Notes (Signed)
Procedure Name: Intubation Date/Time: 01/22/2023 8:39 AM  Performed by: Camillia Herter, CRNAPre-anesthesia Checklist: Patient identified, Emergency Drugs available, Suction available and Patient being monitored Patient Re-evaluated:Patient Re-evaluated prior to induction Oxygen Delivery Method: Circle System Utilized Preoxygenation: Pre-oxygenation with 100% oxygen Induction Type: IV induction Ventilation: Mask ventilation without difficulty Laryngoscope Size: Miller Grade View: Grade I Tube type: Oral Tube size: 7.0 mm Number of attempts: 1 Airway Equipment and Method: Stylet and Oral airway Placement Confirmation: ETT inserted through vocal cords under direct vision, positive ETCO2 and breath sounds checked- equal and bilateral Tube secured with: Tape Dental Injury: Teeth and Oropharynx as per pre-operative assessment

## 2023-01-22 NOTE — Anesthesia Preprocedure Evaluation (Signed)
Anesthesia Evaluation  Patient identified by MRN, date of birth, ID band Patient awake    Reviewed: Allergy & Precautions, NPO status , Patient's Chart, lab work & pertinent test results  History of Anesthesia Complications Negative for: history of anesthetic complications  Airway Mallampati: I  TM Distance: >3 FB Neck ROM: Full    Dental  (+) Teeth Intact, Dental Advisory Given   Pulmonary neg pulmonary ROS   breath sounds clear to auscultation       Cardiovascular negative cardio ROS  Rhythm:Regular     Neuro/Psych   Anxiety     negative neurological ROS  negative psych ROS   GI/Hepatic negative GI ROS, Neg liver ROS,,,  Endo/Other  negative endocrine ROS    Renal/GU negative Renal ROS     Musculoskeletal SYMPTOMATIC HARDWARE LEFT OLECRANON   Abdominal   Peds  Hematology negative hematology ROS (+)   Anesthesia Other Findings   Reproductive/Obstetrics Lab Results      Component                Value               Date                      PREGTESTUR               NEGATIVE            01/15/2022                HCG                      <5.0                05/31/2022                                        Anesthesia Physical Anesthesia Plan  ASA: 2  Anesthesia Plan: General   Post-op Pain Management: Ofirmev IV (intra-op)*, Toradol IV (intra-op)* and Ketamine IV*   Induction:   PONV Risk Score and Plan: 3 and Ondansetron, Dexamethasone, Propofol infusion, TIVA and Midazolam  Airway Management Planned: Oral ETT  Additional Equipment: None  Intra-op Plan:   Post-operative Plan:   Informed Consent: I have reviewed the patients History and Physical, chart, labs and discussed the procedure including the risks, benefits and alternatives for the proposed anesthesia with the patient or authorized representative who has indicated his/her understanding and acceptance.     Dental  advisory given  Plan Discussed with: CRNA  Anesthesia Plan Comments:        Anesthesia Quick Evaluation

## 2023-01-22 NOTE — Discharge Instructions (Signed)
[facility] Discharge Instructions   General Discharge Instructions   WEIGHT BEARING STATUS: Weightbearing as tolerated Left arm   RANGE OF MOTION/ACTIVITY: unrestricted motion of left elbow    Wound Care: dressing changes starting on 01/24/2023. See below  Discharge Wound Care Instructions  Do NOT apply any ointments, solutions or lotions to pin sites or surgical wounds.  These prevent needed drainage and even though solutions like hydrogen peroxide kill bacteria, they also damage cells lining the pin sites that help fight infection.  Applying lotions or ointments can keep the wounds moist and can cause them to breakdown and open up as well. This can increase the risk for infection. When in doubt call the office.  Surgical incisions should be dressed daily.  If any drainage is noted, use one layer of adaptic or Mepitel, then gauze, Kerlix, and an ace wrap.  [email or web address]  [email or web address]  These dressing supplies should be available at local medical supply stores ([facility], [facility], etc). They are not usually carried at places like [facility], [facility], [facility], etc  Once the incision is completely dry and without drainage, it may be left open to air out.  Showering may begin 36-48 hours later.  Cleaning gently with soap and water.   Diet: as you were eating previously.  Can use over the counter stool softeners and bowel preparations, such as Miralax, to help with bowel movements.  Narcotics can be constipating.  Be sure to drink plenty of fluids  PAIN MEDICATION USE AND EXPECTATIONS  You have likely been given narcotic medications to help control your pain.  After a traumatic event that results in an fracture (broken  bone) with or without surgery, it is ok to use narcotic pain medications to help control one's pain.  We understand that everyone responds to pain differently and each individual patient will be evaluated on a regular basis for the continued need for narcotic medications. Ideally, narcotic medication use should last no more than 6-8 weeks (coinciding with fracture healing).   As a patient it is your responsibility as well to monitor narcotic medication use and report the amount and frequency you use these medications when you come to your office visit.   We would also advise that if you are using narcotic medications, you should take a dose prior to therapy to maximize you participation.  IF YOU ARE ON NARCOTIC MEDICATIONS IT IS NOT PERMISSIBLE TO OPERATE A MOTOR VEHICLE (MOTORCYCLE/CAR/TRUCK/MOPED) OR HEAVY MACHINERY DO NOT MIX NARCOTICS WITH OTHER CNS (CENTRAL NERVOUS SYSTEM) DEPRESSANTS SUCH AS ALCOHOL   POST-OPERATIVE OPIOID TAPER INSTRUCTIONS: It is important to wean off of your opioid medication as soon as possible. If you do not need pain medication after your surgery it is ok to stop day one. Opioids include: Codeine, Hydrocodone(Norco, Vicodin), Oxycodone(Percocet, oxycontin) and hydromorphone amongst others.  Long term and even short term use of opiods can cause: Increased pain response Dependence Constipation Depression Respiratory depression And more.  Withdrawal symptoms can include Flu like symptoms Nausea, vomiting And more Techniques to manage these symptoms Hydrate well Eat regular healthy meals Stay active Use relaxation techniques(deep breathing, meditating, yoga) Do Not substitute Alcohol to help with tapering If you have been on opioids for less than two weeks and do not have pain than it is ok to stop all together.  Plan to wean off of opioids This plan should start within one week post op of your fracture surgery  Maintain the same interval or time between  taking each dose and first decrease the dose.  Cut the total daily intake of opioids by one tablet each day Next start to increase the time between doses. The last dose that should be eliminated is the evening dose.    STOP SMOKING OR USING NICOTINE PRODUCTS!!!!  As discussed nicotine severely impairs your body's ability to heal surgical and traumatic wounds but also impairs bone healing.  Wounds and bone heal by forming microscopic blood vessels (angiogenesis) and nicotine is a vasoconstrictor (essentially, shrinks blood vessels).  Therefore, if vasoconstriction occurs to these microscopic blood vessels they essentially disappear and are unable to deliver necessary nutrients to the healing tissue.  This is one modifiable factor that you can do to dramatically increase your chances of healing your injury.    (This means no smoking, no nicotine gum, patches, etc)  DO NOT USE NONSTEROIDAL ANTI-INFLAMMATORY DRUGS (NSAID'S)  Using products such as Advil (ibuprofen), Aleve (naproxen), Motrin (ibuprofen) for additional pain control during fracture healing can delay and/or prevent the healing response.  If you would like to take over the counter (OTC) medication, Tylenol (acetaminophen) is ok.  However, some narcotic medications that are given for pain control contain acetaminophen as well. Therefore, you should not exceed more than 4000 mg of tylenol in a day if you do not have liver disease.  Also note that there are may OTC medicines, such as cold medicines and allergy medicines that my contain tylenol as well.  If you have any questions about medications and/or interactions please ask your doctor/PA or your pharmacist.      ICE AND ELEVATE INJURED/OPERATIVE EXTREMITY  Using ice and elevating the injured extremity above your heart can help with swelling and pain control.  Icing in a pulsatile fashion, such as 20 minutes on and 20 minutes off, can be followed.    Do not place ice directly on skin. Make  sure there is a barrier between to skin and the ice pack.    Using frozen items such as frozen peas works well as the conform nicely to the are that needs to be iced.  USE AN ACE WRAP OR TED HOSE FOR SWELLING CONTROL  In addition to icing and elevation, Ace wraps or TED hose are used to help limit and resolve swelling.  It is recommended to use Ace wraps or TED hose until you are informed to stop.    When using Ace Wraps start the wrapping distally (farthest away from the body) and wrap proximally (closer to the body)   Example: If you had surgery on your leg or thing and you do not have a splint on, start the ace wrap at the toes and work your way up to the thigh        If you had surgery on your upper extremity and do not have a splint on, start the ace wrap at your fingers and work your way up to the upper arm  IF YOU ARE IN A SPLINT OR CAST DO NOT REMOVE IT FOR ANY REASON   If your splint gets wet for any reason please contact the office immediately. You may shower in your splint or cast as long as you keep it dry.  This can be done by wrapping in a cast cover or garbage back (or similar)  Do Not stick any thing down your splint or cast such as pencils, money, or hangers to try and scratch yourself with.  If you feel itchy take benadryl as prescribed  on the bottle for itching  IF YOU ARE IN A CAM BOOT (BLACK BOOT)  You may remove boot periodically. Perform daily dressing changes as noted below.  Wash the liner of the boot regularly and wear a sock when wearing the boot. It is recommended that you sleep in the boot until told otherwise    Call office for the following: Temperature greater than 101F Persistent nausea and vomiting Severe uncontrolled pain Redness, tenderness, or signs of infection (pain, swelling, redness, odor or green/yellow discharge around the site) Difficulty breathing, headache or visual disturbances Hives Persistent dizziness or light-headedness Extreme fatigue Any  other questions or concerns you may have after discharge  In an emergency, call 911 or go to an Emergency Department at a nearby hospital  HELPFUL INFORMATION  If you had a block, it will wear off between 8-24 hrs postop typically.  This is period when your pain may go from nearly zero to the pain you would have had postop without the block.  This is an abrupt transition but nothing dangerous is happening.  You may take an extra dose of narcotic when this happens.  You should wean off your narcotic medicines as soon as you are able.  Most patients will be off or using minimal narcotics before their first postop appointment.   We suggest you use the pain medication the first night prior to going to bed, in order to ease any pain when the anesthesia wears off. You should avoid taking pain medications on an empty stomach as it will make you nauseous.  Do not drink alcoholic beverages or take illicit drugs when taking pain medications.  In most states it is against the law to drive while you are in a splint or sling.  And certainly against the law to drive while taking narcotics.  You may return to work/school in the next couple of days when you feel up to it.   Pain medication may make you constipated.  Below are a few solutions to try in this order: Decrease the amount of pain medication if you aren't having pain. Drink lots of decaffeinated fluids. Drink prune juice and/or each dried prunes  If the first 3 don't work start with additional solutions Take Colace - an over-the-counter stool softener Take Senokot - an over-the-counter laxative Take Miralax - a stronger over-the-counter laxative     CALL THE OFFICE WITH ANY QUESTIONS OR CONCERNS: [phone]   VISIT OUR WEBSITE FOR ADDITIONAL INFORMATION: [email or web address]

## 2023-01-23 ENCOUNTER — Encounter (HOSPITAL_COMMUNITY): Payer: Self-pay | Admitting: Orthopedic Surgery
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
## 2023-01-28 ENCOUNTER — Ambulatory Visit: Payer: BC Managed Care – PPO | Admitting: Physical Therapy

## 2023-01-28 ENCOUNTER — Ambulatory Visit (INDEPENDENT_AMBULATORY_CARE_PROVIDER_SITE_OTHER): Payer: BC Managed Care – PPO | Admitting: Psychology

## 2023-01-28 DIAGNOSIS — F4322 Adjustment disorder with anxiety: Secondary | ICD-10-CM | POA: Diagnosis not present

## 2023-01-28 DIAGNOSIS — R2681 Unsteadiness on feet: Secondary | ICD-10-CM

## 2023-01-28 DIAGNOSIS — R2689 Other abnormalities of gait and mobility: Secondary | ICD-10-CM | POA: Diagnosis not present
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
## 2023-02-04 ENCOUNTER — Ambulatory Visit: Payer: BC Managed Care – PPO | Admitting: Physical Therapy

## 2023-02-06 ENCOUNTER — Encounter
Payer: BC Managed Care – PPO | Attending: Physical Medicine & Rehabilitation | Admitting: Physical Medicine & Rehabilitation

## 2023-02-06 ENCOUNTER — Encounter: Payer: Self-pay | Admitting: Physical Medicine & Rehabilitation

## 2023-02-06 VITALS — BP 122/75 | HR 74 | Ht 62.0 in | Wt 198.0 lb

## 2023-02-06 DIAGNOSIS — S7400XS Injury of sciatic nerve at hip and thigh level, unspecified leg, sequela: Secondary | ICD-10-CM | POA: Insufficient documentation

## 2023-02-06 DIAGNOSIS — T07XXXD Unspecified multiple injuries, subsequent encounter: Secondary | ICD-10-CM | POA: Diagnosis not present

## 2023-02-06 DIAGNOSIS — M25572 Pain in left ankle and joints of left foot: Secondary | ICD-10-CM

## 2023-02-06 DIAGNOSIS — T07XXXA Unspecified multiple injuries, initial encounter: Secondary | ICD-10-CM | POA: Diagnosis not present

## 2023-02-06 DIAGNOSIS — S7400XD Injury of sciatic nerve at hip and thigh level, unspecified leg, subsequent encounter: Secondary | ICD-10-CM
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
## 2023-02-06 NOTE — Patient Instructions (Signed)
ALWAYS FEEL FREE TO CALL OUR OFFICE WITH ANY PROBLEMS OR QUESTIONS ([phone])  **PLEASE NOTE** ALL MEDICATION REFILL REQUESTS (INCLUDING CONTROLLED SUBSTANCES) NEED TO BE MADE AT LEAST 7 DAYS PRIOR TO REFILL BEING DUE. ANY REFILL REQUESTS INSIDE THAT TIME FRAME MAY RESULT IN DELAYS IN RECEIVING YOUR PRESCRIPTION.                    

## 2023-02-11 ENCOUNTER — Ambulatory Visit: Payer: BC Managed Care – PPO | Admitting: Physical Therapy

## 2023-02-12 ENCOUNTER — Ambulatory Visit: Payer: BC Managed Care – PPO | Attending: Orthopedic Surgery | Admitting: Occupational Therapy

## 2023-02-12 DIAGNOSIS — M24522 Contracture, left elbow: Secondary | ICD-10-CM

## 2023-02-12 DIAGNOSIS — M6281 Muscle weakness (generalized): Secondary | ICD-10-CM | POA: Insufficient documentation

## 2023-02-12 DIAGNOSIS — M25522 Pain in left elbow: Secondary | ICD-10-CM | POA: Diagnosis present

## 2023-02-12 DIAGNOSIS — L905 Scar conditions and fibrosis of skin: Secondary | ICD-10-CM | POA: Diagnosis present

## 2023-02-12 DIAGNOSIS — M25622 Stiffness of left elbow, not elsewhere classified: Secondary | ICD-10-CM | POA: Insufficient documentation

## 2023-02-13 ENCOUNTER — Ambulatory Visit: Payer: BC Managed Care – PPO | Admitting: Occupational Therapy

## 2023-02-14 ENCOUNTER — Ambulatory Visit: Payer: BC Managed Care – PPO | Admitting: Occupational Therapy

## 2023-02-14 DIAGNOSIS — M25522 Pain in left elbow: Secondary | ICD-10-CM

## 2023-02-14 DIAGNOSIS — L905 Scar conditions and fibrosis of skin: Secondary | ICD-10-CM

## 2023-02-14 DIAGNOSIS — M24522 Contracture, left elbow: Secondary | ICD-10-CM

## 2023-02-14 DIAGNOSIS — M25622 Stiffness of left elbow, not elsewhere classified: Secondary | ICD-10-CM

## 2023-02-14 DIAGNOSIS — M6281 Muscle weakness (generalized): Secondary | ICD-10-CM | POA: Diagnosis not present

## 2023-02-16 ENCOUNTER — Encounter: Payer: Self-pay | Admitting: Occupational Therapy
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
## 2023-02-19 ENCOUNTER — Ambulatory Visit: Payer: BC Managed Care – PPO | Admitting: Physical Therapy

## 2023-02-22 ENCOUNTER — Ambulatory Visit: Payer: BC Managed Care – PPO | Admitting: Occupational Therapy

## 2023-02-25 ENCOUNTER — Ambulatory Visit: Payer: BC Managed Care – PPO | Attending: Orthopedic Surgery | Admitting: Occupational Therapy

## 2023-02-25 ENCOUNTER — Ambulatory Visit: Payer: BC Managed Care – PPO

## 2023-02-25 DIAGNOSIS — L905 Scar conditions and fibrosis of skin: Secondary | ICD-10-CM | POA: Diagnosis present

## 2023-02-25 DIAGNOSIS — M25622 Stiffness of left elbow, not elsewhere classified: Secondary | ICD-10-CM | POA: Diagnosis present

## 2023-02-25 DIAGNOSIS — M25522 Pain in left elbow: Secondary | ICD-10-CM | POA: Insufficient documentation

## 2023-02-25 DIAGNOSIS — M24522 Contracture, left elbow: Secondary | ICD-10-CM | POA: Insufficient documentation

## 2023-02-25 DIAGNOSIS — M6281 Muscle weakness (generalized): Secondary | ICD-10-CM | POA: Insufficient documentation
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
## 2023-02-26 ENCOUNTER — Ambulatory Visit: Payer: BC Managed Care – PPO | Admitting: Occupational Therapy

## 2023-02-26 DIAGNOSIS — M25522 Pain in left elbow: Secondary | ICD-10-CM

## 2023-02-26 DIAGNOSIS — M6281 Muscle weakness (generalized): Secondary | ICD-10-CM | POA: Diagnosis not present

## 2023-02-26 DIAGNOSIS — L905 Scar conditions and fibrosis of skin: Secondary | ICD-10-CM

## 2023-02-26 DIAGNOSIS — M25622 Stiffness of left elbow, not elsewhere classified: Secondary | ICD-10-CM
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
## 2023-02-28 ENCOUNTER — Ambulatory Visit: Payer: BC Managed Care – PPO | Admitting: Occupational Therapy

## 2023-02-28 DIAGNOSIS — M25522 Pain in left elbow: Secondary | ICD-10-CM

## 2023-02-28 DIAGNOSIS — L905 Scar conditions and fibrosis of skin: Secondary | ICD-10-CM

## 2023-02-28 DIAGNOSIS — M6281 Muscle weakness (generalized): Secondary | ICD-10-CM | POA: Diagnosis not present

## 2023-02-28 DIAGNOSIS — M25622 Stiffness of left elbow, not elsewhere classified: Secondary | ICD-10-CM
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
## 2023-03-04 ENCOUNTER — Ambulatory Visit: Payer: BC Managed Care – PPO

## 2023-03-04 ENCOUNTER — Ambulatory Visit: Payer: BC Managed Care – PPO | Admitting: Occupational Therapy

## 2023-03-04 DIAGNOSIS — M25622 Stiffness of left elbow, not elsewhere classified: Secondary | ICD-10-CM

## 2023-03-04 DIAGNOSIS — M25522 Pain in left elbow: Secondary | ICD-10-CM

## 2023-03-04 DIAGNOSIS — M6281 Muscle weakness (generalized): Secondary | ICD-10-CM | POA: Diagnosis not present

## 2023-03-04 DIAGNOSIS — M24522 Contracture, left elbow: Secondary | ICD-10-CM

## 2023-03-04 DIAGNOSIS — L905 Scar conditions and fibrosis of skin: Secondary | ICD-10-CM
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
## 2023-03-05 ENCOUNTER — Ambulatory Visit: Payer: BC Managed Care – PPO | Admitting: Occupational Therapy

## 2023-03-05 DIAGNOSIS — L905 Scar conditions and fibrosis of skin: Secondary | ICD-10-CM

## 2023-03-05 DIAGNOSIS — M25522 Pain in left elbow: Secondary | ICD-10-CM

## 2023-03-05 DIAGNOSIS — M24522 Contracture, left elbow: Secondary | ICD-10-CM

## 2023-03-05 DIAGNOSIS — M25622 Stiffness of left elbow, not elsewhere classified: Secondary | ICD-10-CM

## 2023-03-05 DIAGNOSIS — M6281 Muscle weakness (generalized): Secondary | ICD-10-CM | POA: Diagnosis not present
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
## 2023-03-07 ENCOUNTER — Ambulatory Visit: Payer: BC Managed Care – PPO | Admitting: Occupational Therapy

## 2023-03-07 DIAGNOSIS — M6281 Muscle weakness (generalized): Secondary | ICD-10-CM | POA: Diagnosis not present

## 2023-03-07 DIAGNOSIS — M24522 Contracture, left elbow: Secondary | ICD-10-CM

## 2023-03-07 DIAGNOSIS — L905 Scar conditions and fibrosis of skin: Secondary | ICD-10-CM

## 2023-03-07 DIAGNOSIS — M25622 Stiffness of left elbow, not elsewhere classified: Secondary | ICD-10-CM

## 2023-03-07 DIAGNOSIS — M25522 Pain in left elbow: Secondary | ICD-10-CM
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
## 2023-03-11 ENCOUNTER — Ambulatory Visit: Payer: BC Managed Care – PPO | Admitting: Occupational Therapy

## 2023-03-11 DIAGNOSIS — M25622 Stiffness of left elbow, not elsewhere classified: Secondary | ICD-10-CM

## 2023-03-11 DIAGNOSIS — M6281 Muscle weakness (generalized): Secondary | ICD-10-CM | POA: Diagnosis not present

## 2023-03-11 DIAGNOSIS — M24522 Contracture, left elbow: Secondary | ICD-10-CM

## 2023-03-11 DIAGNOSIS — M25522 Pain in left elbow: Secondary | ICD-10-CM

## 2023-03-11 DIAGNOSIS — L905 Scar conditions and fibrosis of skin: Secondary | ICD-10-CM
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
## 2023-03-14 ENCOUNTER — Ambulatory Visit: Payer: BC Managed Care – PPO | Admitting: Occupational Therapy

## 2023-03-14 DIAGNOSIS — M6281 Muscle weakness (generalized): Secondary | ICD-10-CM | POA: Diagnosis not present

## 2023-03-14 DIAGNOSIS — L905 Scar conditions and fibrosis of skin: Secondary | ICD-10-CM

## 2023-03-14 DIAGNOSIS — M25522 Pain in left elbow: Secondary | ICD-10-CM

## 2023-03-14 DIAGNOSIS — M25622 Stiffness of left elbow, not elsewhere classified: Secondary | ICD-10-CM
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
## 2023-03-15 ENCOUNTER — Ambulatory Visit: Payer: BC Managed Care – PPO | Admitting: Occupational Therapy

## 2023-03-15 DIAGNOSIS — M6281 Muscle weakness (generalized): Secondary | ICD-10-CM

## 2023-03-15 DIAGNOSIS — M25522 Pain in left elbow: Secondary | ICD-10-CM

## 2023-03-15 DIAGNOSIS — M24522 Contracture, left elbow: Secondary | ICD-10-CM

## 2023-03-15 DIAGNOSIS — L905 Scar conditions and fibrosis of skin: Secondary | ICD-10-CM

## 2023-03-15 DIAGNOSIS — M25622 Stiffness of left elbow, not elsewhere classified: Secondary | ICD-10-CM
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
## 2023-03-18 ENCOUNTER — Ambulatory Visit (INDEPENDENT_AMBULATORY_CARE_PROVIDER_SITE_OTHER): Payer: BC Managed Care – PPO | Admitting: Psychology

## 2023-03-18 ENCOUNTER — Ambulatory Visit: Payer: BC Managed Care – PPO | Admitting: Occupational Therapy

## 2023-03-18 DIAGNOSIS — M25622 Stiffness of left elbow, not elsewhere classified: Secondary | ICD-10-CM

## 2023-03-18 DIAGNOSIS — M24522 Contracture, left elbow: Secondary | ICD-10-CM

## 2023-03-18 DIAGNOSIS — M25522 Pain in left elbow: Secondary | ICD-10-CM

## 2023-03-18 DIAGNOSIS — M6281 Muscle weakness (generalized): Secondary | ICD-10-CM | POA: Diagnosis not present

## 2023-03-18 DIAGNOSIS — F4322 Adjustment disorder with anxiety: Secondary | ICD-10-CM | POA: Diagnosis not present

## 2023-03-18 DIAGNOSIS — L905 Scar conditions and fibrosis of skin: Secondary | ICD-10-CM
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
## 2023-03-21 ENCOUNTER — Ambulatory Visit: Payer: BC Managed Care – PPO | Attending: Orthopedic Surgery | Admitting: Occupational Therapy

## 2023-03-21 DIAGNOSIS — M25522 Pain in left elbow: Secondary | ICD-10-CM

## 2023-03-21 DIAGNOSIS — L905 Scar conditions and fibrosis of skin: Secondary | ICD-10-CM | POA: Diagnosis present

## 2023-03-21 DIAGNOSIS — M24522 Contracture, left elbow: Secondary | ICD-10-CM | POA: Diagnosis present

## 2023-03-21 DIAGNOSIS — M6281 Muscle weakness (generalized): Secondary | ICD-10-CM | POA: Diagnosis present

## 2023-03-21 DIAGNOSIS — M25622 Stiffness of left elbow, not elsewhere classified: Secondary | ICD-10-CM | POA: Diagnosis present
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
## 2023-03-25 ENCOUNTER — Ambulatory Visit: Payer: BC Managed Care – PPO | Admitting: Occupational Therapy

## 2023-03-25 DIAGNOSIS — M6281 Muscle weakness (generalized): Secondary | ICD-10-CM | POA: Diagnosis not present

## 2023-03-25 DIAGNOSIS — L905 Scar conditions and fibrosis of skin: Secondary | ICD-10-CM

## 2023-03-25 DIAGNOSIS — M25522 Pain in left elbow: Secondary | ICD-10-CM

## 2023-03-25 DIAGNOSIS — M25622 Stiffness of left elbow, not elsewhere classified: Secondary | ICD-10-CM

## 2023-03-25 DIAGNOSIS — M24522 Contracture, left elbow: Secondary | ICD-10-CM
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
## 2023-03-28 ENCOUNTER — Ambulatory Visit: Payer: BC Managed Care – PPO | Admitting: Occupational Therapy

## 2023-03-28 DIAGNOSIS — M6281 Muscle weakness (generalized): Secondary | ICD-10-CM

## 2023-03-28 DIAGNOSIS — M24522 Contracture, left elbow: Secondary | ICD-10-CM

## 2023-03-28 DIAGNOSIS — M25522 Pain in left elbow: Secondary | ICD-10-CM

## 2023-03-28 DIAGNOSIS — L905 Scar conditions and fibrosis of skin: Secondary | ICD-10-CM

## 2023-03-28 DIAGNOSIS — M25622 Stiffness of left elbow, not elsewhere classified: Secondary | ICD-10-CM
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
## 2023-04-01 ENCOUNTER — Ambulatory Visit: Payer: BC Managed Care – PPO | Admitting: Occupational Therapy

## 2023-04-01 DIAGNOSIS — L905 Scar conditions and fibrosis of skin: Secondary | ICD-10-CM

## 2023-04-01 DIAGNOSIS — M6281 Muscle weakness (generalized): Secondary | ICD-10-CM

## 2023-04-01 DIAGNOSIS — M25622 Stiffness of left elbow, not elsewhere classified: Secondary | ICD-10-CM

## 2023-04-01 DIAGNOSIS — M25522 Pain in left elbow: Secondary | ICD-10-CM
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
## 2023-04-04 ENCOUNTER — Ambulatory Visit: Payer: BC Managed Care – PPO | Admitting: Occupational Therapy

## 2023-04-04 DIAGNOSIS — L905 Scar conditions and fibrosis of skin: Secondary | ICD-10-CM

## 2023-04-04 DIAGNOSIS — M6281 Muscle weakness (generalized): Secondary | ICD-10-CM

## 2023-04-04 DIAGNOSIS — M25522 Pain in left elbow: Secondary | ICD-10-CM

## 2023-04-04 DIAGNOSIS — M24522 Contracture, left elbow: Secondary | ICD-10-CM

## 2023-04-04 DIAGNOSIS — M25622 Stiffness of left elbow, not elsewhere classified: Secondary | ICD-10-CM
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
## 2023-04-08 ENCOUNTER — Ambulatory Visit: Payer: BC Managed Care – PPO | Admitting: Occupational Therapy

## 2023-04-11 ENCOUNTER — Ambulatory Visit: Payer: BC Managed Care – PPO | Admitting: Occupational Therapy

## 2023-04-11 DIAGNOSIS — M25622 Stiffness of left elbow, not elsewhere classified: Secondary | ICD-10-CM

## 2023-04-11 DIAGNOSIS — M6281 Muscle weakness (generalized): Secondary | ICD-10-CM | POA: Diagnosis not present

## 2023-04-11 DIAGNOSIS — M25522 Pain in left elbow: Secondary | ICD-10-CM

## 2023-04-11 DIAGNOSIS — M24522 Contracture, left elbow: Secondary | ICD-10-CM

## 2023-04-11 DIAGNOSIS — L905 Scar conditions and fibrosis of skin: Secondary | ICD-10-CM
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
## 2023-04-15 ENCOUNTER — Ambulatory Visit (INDEPENDENT_AMBULATORY_CARE_PROVIDER_SITE_OTHER): Payer: BC Managed Care – PPO | Admitting: Psychology

## 2023-04-15 DIAGNOSIS — F4322 Adjustment disorder with anxiety: Secondary | ICD-10-CM
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
## 2023-04-18 ENCOUNTER — Ambulatory Visit: Payer: BC Managed Care – PPO | Admitting: Occupational Therapy

## 2023-04-18 DIAGNOSIS — M25622 Stiffness of left elbow, not elsewhere classified: Secondary | ICD-10-CM

## 2023-04-18 DIAGNOSIS — L905 Scar conditions and fibrosis of skin: Secondary | ICD-10-CM

## 2023-04-18 DIAGNOSIS — M25522 Pain in left elbow: Secondary | ICD-10-CM

## 2023-04-18 DIAGNOSIS — M6281 Muscle weakness (generalized): Secondary | ICD-10-CM | POA: Diagnosis not present
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
## 2023-05-03 ENCOUNTER — Ambulatory Visit: Payer: BC Managed Care – PPO | Attending: Orthopedic Surgery | Admitting: Occupational Therapy

## 2023-05-03 DIAGNOSIS — M6281 Muscle weakness (generalized): Secondary | ICD-10-CM | POA: Diagnosis present

## 2023-05-03 DIAGNOSIS — M25522 Pain in left elbow: Secondary | ICD-10-CM | POA: Insufficient documentation

## 2023-05-03 DIAGNOSIS — M25622 Stiffness of left elbow, not elsewhere classified: Secondary | ICD-10-CM | POA: Diagnosis present

## 2023-05-03 DIAGNOSIS — L905 Scar conditions and fibrosis of skin: Secondary | ICD-10-CM | POA: Diagnosis present

## 2023-05-03 DIAGNOSIS — M24522 Contracture, left elbow: Secondary | ICD-10-CM | POA: Diagnosis present
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
## 2023-05-13 ENCOUNTER — Ambulatory Visit: Payer: BC Managed Care – PPO | Admitting: Psychology

## 2023-05-13 DIAGNOSIS — F4322 Adjustment disorder with anxiety: Secondary | ICD-10-CM | POA: Diagnosis not present
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
## 2023-05-24 ENCOUNTER — Ambulatory Visit: Payer: BC Managed Care – PPO | Attending: Orthopedic Surgery | Admitting: Occupational Therapy

## 2023-05-24 DIAGNOSIS — M6281 Muscle weakness (generalized): Secondary | ICD-10-CM | POA: Diagnosis present

## 2023-05-24 DIAGNOSIS — M25622 Stiffness of left elbow, not elsewhere classified: Secondary | ICD-10-CM | POA: Diagnosis present

## 2023-05-24 DIAGNOSIS — M25522 Pain in left elbow: Secondary | ICD-10-CM | POA: Insufficient documentation
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
## 2023-07-01 ENCOUNTER — Ambulatory Visit: Payer: BC Managed Care – PPO | Admitting: Psychology

## 2023-07-01 DIAGNOSIS — F4322 Adjustment disorder with anxiety: Secondary | ICD-10-CM
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
## 2023-07-05 ENCOUNTER — Ambulatory Visit: Payer: BC Managed Care – PPO | Attending: Orthopedic Surgery | Admitting: Occupational Therapy

## 2023-07-05 DIAGNOSIS — M25622 Stiffness of left elbow, not elsewhere classified: Secondary | ICD-10-CM | POA: Diagnosis present

## 2023-07-05 DIAGNOSIS — M25522 Pain in left elbow: Secondary | ICD-10-CM | POA: Diagnosis present

## 2023-07-05 DIAGNOSIS — M6281 Muscle weakness (generalized): Secondary | ICD-10-CM | POA: Diagnosis present
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
## 2023-07-29 ENCOUNTER — Ambulatory Visit: Payer: BC Managed Care – PPO | Admitting: Psychology

## 2023-07-29 DIAGNOSIS — F4322 Adjustment disorder with anxiety: Secondary | ICD-10-CM

## 2023-07-29 NOTE — Progress Notes (Signed)
 [facility] Counselor/Therapist Progress Note  Patient ID: EMARA SCHROM, MRN: [id number],    Date: 07/29/2023  Time Spent: 5:00pm-5:45pm   45 minutes   Treatment Type: Individual Therapy  Reported Symptoms: stress  Mental Status Exam: Appearance:  Casual     Behavior: Appropriate  Motor: Normal  Speech/Language:  Normal Rate  Affect: Appropriate  Mood: normal  Thought process: normal  Thought content:   WNL  Sensory/Perceptual disturbances:   WNL  Orientation: oriented to person, place, time/date, and situation  Attention: Good  Concentration: Good  Memory: WNL  Fund of knowledge:  Good  Insight:   Good  Judgment:  Good  Impulse Control: Good   Risk Assessment: Danger to Self:  No Self-injurious Behavior: No Danger to Others: No Duty to Warn:no Physical Aggression / Violence:No  Access to Firearms a concern: No  Gang Involvement:No   Subjective: Pt present for face-to-face individual therapy via video.  Pt consents to telehealth video session and is aware of limitations and benefits of virtual sessions.   Location of pt: home Location of therapist: home office.   Pt talked about work and school.   Pt was anxious about in person classes but she is doing ok in them.  Pt has been going to the gym consistently.   Pt feels like she is "going through the motions" bc she is so busy.   She does get to hang out with her friends on weekends.    Worked on [employer]. Pt talked about her anxiety.  Pt has been trying to do more new things despite feeling anxious and this has helped her.   Pt talked about her dating life.   She does not like men her age bc they are immature but is not sure about dating older men.  She is not happy with today's culture of men.  Addressed how pt is focusing on herself and being ok with being single.  She is making healthy choices and being selective.  Provided supportive therapy.  Interventions: Cognitive Behavioral Therapy and  Insight-Oriented  Diagnosis:  F43.22  Plan of Care: Recommended ongoing therapy.  Pt participated in setting treatment goals.   Plan to meet monthly.  Pt agrees with treatment plan.    Treatment Plan (Treatment Plan target date:  03/17/2024) Client Abilities/Strengths  Pt is bright, engaging and motivated for therapy.  Client Treatment Preferences  Individual therapy.  Client Statement of Needs  Improve coping skills.  Symptoms  Autonomic hyperactivity (e.g., palpitations, shortness of breath, dry mouth, trouble swallowing, nausea, diarrhea). Excessive and/or unrealistic worry that is difficult to control occurring more days than not for at least 6 months about a number of events or activities. Hypervigilance (e.g., feeling constantly on edge, experiencing concentration difficulties, having trouble falling or staying asleep, exhibiting a general state of irritability). Motor tension (e.g., restlessness, tiredness, shakiness, muscle tension). Problems Addressed  Anxiety Goals 1. Enhance ability to effectively cope with the full variety of life's worries and anxieties. 2. Learn and implement coping skills that result in a reduction of anxiety and worry, and improved daily functioning. Objective Learn to accept limitations in life and commit to tolerating, rather than avoiding, unpleasant emotions while accomplishing meaningful goals. Target Date: 2024-03-17  Frequency: Monthly Progress: 60 Modality: individual Related Interventions 1. Use techniques from Acceptance and Commitment Therapy to help client accept uncomfortable realities such as lack of complete control, imperfections, and uncertainty and tolerate unpleasant emotions and thoughts in order to accomplish value-consistent goals.  Objective Learn and implement problem-solving strategies for realistically addressing worries. Target Date: 2024-03-17  Frequency: Monthly Progress: 60 Modality: individual Related Interventions 1. Assign  the client a homework exercise in which he/she problem-solves a current problem.  review, reinforce success, and provide corrective feedback toward improvement. 2. Teach the client problem-solving strategies involving specifically defining a problem, generating options for addressing it, evaluating the pros and cons of each option, selecting and implementing an optional action, and reevaluating and refining the action. Objective Learn and implement calming skills to reduce overall anxiety and manage anxiety symptoms. Target Date: 2024-03-17  Frequency: Monthly Progress: 60 Modality: individual Related Interventions 1. Assign the client to read about progressive muscle relaxation and other calming strategies in relevant books or treatment manuals (e.g., Progressive Relaxation Training by Rodolfo Clan and Arvil Birks; Mastery of Your Anxiety and Worry: Workbook by Rodney Clamp). 2. Assign the client homework each session in which he/she practices relaxation exercises daily, gradually applying them progressively from non-anxiety-provoking to anxiety-provoking situations; review and reinforce success while providing corrective feedback toward improvement. 3. Teach the client calming/relaxation skills (e.g., applied relaxation, progressive muscle relaxation, cue controlled relaxation; mindful breathing; biofeedback) and how to discriminate better between relaxation and tension; teach the client how to apply these skills to his/her daily life. 3. Reduce overall frequency, intensity, and duration of the anxiety so that daily functioning is not impaired. 4. Resolve the core conflict that is the source of anxiety. 5. Stabilize anxiety level while increasing ability to function on a daily basis. Diagnosis :    F43.22  Conditions For Discharge Achievement of treatment goals and objectives.  Anees Vanecek, LCSW

## 2023-08-27 ENCOUNTER — Ambulatory Visit: Payer: BC Managed Care – PPO | Admitting: Psychology

## 2023-08-27 DIAGNOSIS — F4322 Adjustment disorder with anxiety: Secondary | ICD-10-CM
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
## 2023-09-01 ENCOUNTER — Other Ambulatory Visit: Payer: Self-pay | Admitting: Physical Medicine & Rehabilitation

## 2023-09-01 DIAGNOSIS — S7400XS Injury of sciatic nerve at hip and thigh level, unspecified leg, sequela: Secondary | ICD-10-CM

## 2023-09-02 ENCOUNTER — Other Ambulatory Visit: Payer: Self-pay | Admitting: Physical Medicine & Rehabilitation

## 2023-09-02 DIAGNOSIS — S7400XS Injury of sciatic nerve at hip and thigh level, unspecified leg, sequela: Secondary | ICD-10-CM

## 2023-09-02 NOTE — Telephone Encounter (Signed)
 Please disregard rx for 50mg  trazodone.  thanks

## 2023-09-16 ENCOUNTER — Other Ambulatory Visit: Payer: Self-pay | Admitting: Physical Medicine & Rehabilitation

## 2023-09-16 DIAGNOSIS — S7400XS Injury of sciatic nerve at hip and thigh level, unspecified leg, sequela: Secondary | ICD-10-CM

## 2023-09-24 ENCOUNTER — Ambulatory Visit (INDEPENDENT_AMBULATORY_CARE_PROVIDER_SITE_OTHER): Payer: BC Managed Care – PPO | Admitting: Psychology

## 2023-09-24 DIAGNOSIS — F4322 Adjustment disorder with anxiety: Secondary | ICD-10-CM | POA: Diagnosis not present
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
## 2023-10-02 ENCOUNTER — Telehealth (HOSPITAL_BASED_OUTPATIENT_CLINIC_OR_DEPARTMENT_OTHER): Payer: Self-pay | Admitting: Physical Therapy

## 2023-10-02 NOTE — Telephone Encounter (Signed)
 Called and LVM to remind patient of upcoming physical therapy evaluation appointment. Requested call back to confirm appointment attendance.

## 2023-10-05 ENCOUNTER — Ambulatory Visit (HOSPITAL_BASED_OUTPATIENT_CLINIC_OR_DEPARTMENT_OTHER): Attending: Orthopedic Surgery | Admitting: Physical Therapy

## 2023-10-05 ENCOUNTER — Encounter (HOSPITAL_BASED_OUTPATIENT_CLINIC_OR_DEPARTMENT_OTHER): Payer: Self-pay | Admitting: Physical Therapy

## 2023-10-05 ENCOUNTER — Other Ambulatory Visit: Payer: Self-pay

## 2023-10-05 DIAGNOSIS — R2689 Other abnormalities of gait and mobility: Secondary | ICD-10-CM | POA: Diagnosis present

## 2023-10-05 DIAGNOSIS — M25561 Pain in right knee: Secondary | ICD-10-CM | POA: Diagnosis present

## 2023-10-05 DIAGNOSIS — M25661 Stiffness of right knee, not elsewhere classified: Secondary | ICD-10-CM | POA: Diagnosis present

## 2023-10-05 DIAGNOSIS — G8929 Other chronic pain: Secondary | ICD-10-CM | POA: Insufficient documentation
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
## 2023-10-07 ENCOUNTER — Encounter (HOSPITAL_BASED_OUTPATIENT_CLINIC_OR_DEPARTMENT_OTHER): Payer: Self-pay | Admitting: Physical Therapy

## 2023-10-10 ENCOUNTER — Encounter (HOSPITAL_BASED_OUTPATIENT_CLINIC_OR_DEPARTMENT_OTHER): Payer: Self-pay

## 2023-10-10 ENCOUNTER — Ambulatory Visit (HOSPITAL_BASED_OUTPATIENT_CLINIC_OR_DEPARTMENT_OTHER)

## 2023-10-10 DIAGNOSIS — M25561 Pain in right knee: Secondary | ICD-10-CM | POA: Diagnosis not present

## 2023-10-10 DIAGNOSIS — M25661 Stiffness of right knee, not elsewhere classified: Secondary | ICD-10-CM

## 2023-10-10 DIAGNOSIS — G8929 Other chronic pain: Secondary | ICD-10-CM

## 2023-10-10 DIAGNOSIS — R2689 Other abnormalities of gait and mobility: Secondary | ICD-10-CM
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
## 2023-10-17 ENCOUNTER — Ambulatory Visit (HOSPITAL_BASED_OUTPATIENT_CLINIC_OR_DEPARTMENT_OTHER): Attending: Orthopedic Surgery

## 2023-10-17 ENCOUNTER — Encounter (HOSPITAL_BASED_OUTPATIENT_CLINIC_OR_DEPARTMENT_OTHER): Payer: Self-pay

## 2023-10-17 DIAGNOSIS — R2689 Other abnormalities of gait and mobility: Secondary | ICD-10-CM | POA: Diagnosis present

## 2023-10-17 DIAGNOSIS — M25661 Stiffness of right knee, not elsewhere classified: Secondary | ICD-10-CM | POA: Diagnosis present

## 2023-10-17 DIAGNOSIS — G8929 Other chronic pain: Secondary | ICD-10-CM | POA: Insufficient documentation

## 2023-10-17 DIAGNOSIS — M6281 Muscle weakness (generalized): Secondary | ICD-10-CM | POA: Insufficient documentation

## 2023-10-17 DIAGNOSIS — M25561 Pain in right knee: Secondary | ICD-10-CM | POA: Insufficient documentation
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
## 2023-10-22 ENCOUNTER — Ambulatory Visit (INDEPENDENT_AMBULATORY_CARE_PROVIDER_SITE_OTHER): Admitting: Psychology

## 2023-10-22 DIAGNOSIS — F4322 Adjustment disorder with anxiety: Secondary | ICD-10-CM | POA: Diagnosis not present
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
## 2023-10-23 ENCOUNTER — Encounter (HOSPITAL_BASED_OUTPATIENT_CLINIC_OR_DEPARTMENT_OTHER): Payer: Self-pay | Admitting: Physical Therapy

## 2023-10-23 ENCOUNTER — Ambulatory Visit (HOSPITAL_BASED_OUTPATIENT_CLINIC_OR_DEPARTMENT_OTHER): Admitting: Physical Therapy

## 2023-10-23 DIAGNOSIS — M25661 Stiffness of right knee, not elsewhere classified: Secondary | ICD-10-CM

## 2023-10-23 DIAGNOSIS — M6281 Muscle weakness (generalized): Secondary | ICD-10-CM

## 2023-10-23 DIAGNOSIS — G8929 Other chronic pain: Secondary | ICD-10-CM

## 2023-10-23 DIAGNOSIS — R2689 Other abnormalities of gait and mobility: Secondary | ICD-10-CM
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
## 2023-11-18 ENCOUNTER — Ambulatory Visit (HOSPITAL_BASED_OUTPATIENT_CLINIC_OR_DEPARTMENT_OTHER): Attending: Orthopedic Surgery

## 2023-11-18 ENCOUNTER — Encounter (HOSPITAL_BASED_OUTPATIENT_CLINIC_OR_DEPARTMENT_OTHER): Payer: Self-pay

## 2023-11-18 DIAGNOSIS — M25661 Stiffness of right knee, not elsewhere classified: Secondary | ICD-10-CM | POA: Diagnosis present

## 2023-11-18 DIAGNOSIS — M25561 Pain in right knee: Secondary | ICD-10-CM | POA: Insufficient documentation

## 2023-11-18 DIAGNOSIS — R2689 Other abnormalities of gait and mobility: Secondary | ICD-10-CM | POA: Insufficient documentation

## 2023-11-18 DIAGNOSIS — G8929 Other chronic pain: Secondary | ICD-10-CM | POA: Diagnosis present
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
## 2023-11-19 ENCOUNTER — Ambulatory Visit (INDEPENDENT_AMBULATORY_CARE_PROVIDER_SITE_OTHER): Admitting: Psychology

## 2023-11-19 DIAGNOSIS — F4322 Adjustment disorder with anxiety: Secondary | ICD-10-CM
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
## 2023-11-26 ENCOUNTER — Encounter (HOSPITAL_BASED_OUTPATIENT_CLINIC_OR_DEPARTMENT_OTHER): Admitting: Physical Therapy

## 2023-12-16 ENCOUNTER — Ambulatory Visit (INDEPENDENT_AMBULATORY_CARE_PROVIDER_SITE_OTHER): Admitting: Psychology

## 2023-12-16 DIAGNOSIS — F4322 Adjustment disorder with anxiety: Secondary | ICD-10-CM

## 2023-12-16 NOTE — Progress Notes (Signed)
 [facility] Counselor/Therapist Progress Note  Patient ID: Alyssa Lopez, MRN: [id number],    Date: 12/16/2023  Time Spent: 5:00pm-5:45pm   45 minutes   Treatment Type: Individual Therapy  Reported Symptoms: stress, anxiety  Mental Status Exam: Appearance:  Casual     Behavior: Appropriate  Motor: Normal  Speech/Language:  Normal Rate  Affect: Appropriate  Mood: normal  Thought process: normal  Thought content:   WNL  Sensory/Perceptual disturbances:   WNL  Orientation: oriented to person, place, time/date, and situation  Attention: Good  Concentration: Good  Memory: WNL  Fund of knowledge:  Good  Insight:   Good  Judgment:  Good  Impulse Control: Good   Risk Assessment: Danger to Self:  No Self-injurious Behavior: No Danger to Others: No Duty to Warn:no Physical Aggression / Violence:No  Access to Firearms a concern: No  Gang Involvement:No   Subjective: Pt present for face-to-face individual therapy via video.  Pt consents to telehealth video session and is aware of limitations and benefits of virtual sessions.   Location of pt: home Location of therapist: home office.   Pt talked about work.  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   Pt's anxiety has been getting better bc she is getting more use to working with pts.   Worked on calming strategies.  Pt has used the self affirming mantras we identified in therapy.   She has told herself  I can do hard things.   Pt did get anxious when she worked her first weekend but it went better than she thought it would.   Pt has been working on not letting her anxiety control what she chooses to do.  She nudges her self to do things even if anxious so she won't miss out.     Also encouraged pt to inhale grace and exhale gratitude since she can be hard on herself.   Pt and family are trying to plan a weekend to the beach this summer but it is hard juggling everyone's work schedules.  Pt talked  about her friends.  She went out with her sister and her friends but pt does not want to go out with them much bc they drink a lot and pt does not.  Pt spends a lot of time with her little sister bc they have gotten closer.  Pt talked about getting anxious at times when she drives.  Worked on calming strategies.   Provided supportive therapy.  Interventions: Cognitive Behavioral Therapy and Insight-Oriented  Diagnosis:  F43.22  Plan of Care: Recommended ongoing therapy.  Pt participated in setting treatment goals.   Plan to meet monthly.  Pt agrees with treatment plan.    Treatment Plan (Treatment Plan target date:  03/17/2024) Client Abilities/Strengths  Pt is bright, engaging and motivated for therapy.  Client Treatment Preferences  Individual therapy.  Client Statement of Needs  Improve coping skills.  Symptoms  Autonomic hyperactivity (e.g., palpitations, shortness of breath, dry mouth, trouble swallowing, nausea, diarrhea). Excessive and/or unrealistic worry that is difficult to control occurring more days than not for at least 6 months about a number of events or activities. Hypervigilance (e.g., feeling constantly on edge, experiencing concentration difficulties, having trouble falling or staying asleep, exhibiting a general state of irritability). Motor tension (e.g., restlessness, tiredness, shakiness, muscle tension). Problems Addressed  Anxiety Goals 1. Enhance ability to effectively cope with the full variety of life's worries and anxieties. 2. Learn and implement coping skills  that result in a reduction of anxiety and worry, and improved daily functioning. Objective Learn to accept limitations in life and commit to tolerating, rather than avoiding, unpleasant emotions while accomplishing meaningful goals. Target Date: 2024-03-17  Frequency: Monthly Progress: 60 Modality: individual Related Interventions 1. Use techniques from Acceptance and Commitment Therapy to help client  accept uncomfortable realities such as lack of complete control, imperfections, and uncertainty and tolerate unpleasant emotions and thoughts in order to accomplish value-consistent goals. Objective Learn and implement problem-solving strategies for realistically addressing worries. Target Date: 2024-03-17  Frequency: Monthly Progress: 60 Modality: individual Related Interventions 1. Assign the client a homework exercise in which he/she problem-solves a current problem.  review, reinforce success, and provide corrective feedback toward improvement. 2. Teach the client problem-solving strategies involving specifically defining a problem, generating options for addressing it, evaluating the pros and cons of each option, selecting and implementing an optional action, and reevaluating and refining the action. Objective Learn and implement calming skills to reduce overall anxiety and manage anxiety symptoms. Target Date: 2024-03-17  Frequency: Monthly Progress: 60 Modality: individual Related Interventions 1. Assign the client to read about progressive muscle relaxation and other calming strategies in relevant books or treatment manuals (e.g., Progressive Relaxation Training by Thornell and Elmer; Mastery of Your Anxiety and Worry: Workbook by Richarda armin Given). 2. Assign the client homework each session in which he/she practices relaxation exercises daily, gradually applying them progressively from non-anxiety-provoking to anxiety-provoking situations; review and reinforce success while providing corrective feedback toward improvement. 3. Teach the client calming/relaxation skills (e.g., applied relaxation, progressive muscle relaxation, cue controlled relaxation; mindful breathing; biofeedback) and how to discriminate better between relaxation and tension; teach the client how to apply these skills to his/her daily life. 3. Reduce overall frequency, intensity, and duration of the anxiety so that  daily functioning is not impaired. 4. Resolve the core conflict that is the source of anxiety. 5. Stabilize anxiety level while increasing ability to function on a daily basis. Diagnosis :    F43.22  Conditions For Discharge Achievement of treatment goals and objectives.  Tereka Thorley, LCSW

## 2023-12-18 ENCOUNTER — Encounter (HOSPITAL_BASED_OUTPATIENT_CLINIC_OR_DEPARTMENT_OTHER): Payer: Self-pay | Admitting: Physical Therapy

## 2023-12-18 ENCOUNTER — Ambulatory Visit (HOSPITAL_BASED_OUTPATIENT_CLINIC_OR_DEPARTMENT_OTHER): Attending: Orthopedic Surgery | Admitting: Physical Therapy

## 2023-12-18 DIAGNOSIS — M25661 Stiffness of right knee, not elsewhere classified: Secondary | ICD-10-CM | POA: Insufficient documentation

## 2023-12-18 DIAGNOSIS — M25561 Pain in right knee: Secondary | ICD-10-CM | POA: Diagnosis present

## 2023-12-18 DIAGNOSIS — R2689 Other abnormalities of gait and mobility: Secondary | ICD-10-CM | POA: Diagnosis present

## 2023-12-18 DIAGNOSIS — M6281 Muscle weakness (generalized): Secondary | ICD-10-CM | POA: Insufficient documentation

## 2023-12-18 DIAGNOSIS — G8929 Other chronic pain: Secondary | ICD-10-CM | POA: Diagnosis present
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
## 2023-12-24 ENCOUNTER — Encounter (HOSPITAL_BASED_OUTPATIENT_CLINIC_OR_DEPARTMENT_OTHER)

## 2024-01-01 ENCOUNTER — Ambulatory Visit (HOSPITAL_BASED_OUTPATIENT_CLINIC_OR_DEPARTMENT_OTHER): Admitting: Physical Therapy

## 2024-01-01 ENCOUNTER — Encounter (HOSPITAL_BASED_OUTPATIENT_CLINIC_OR_DEPARTMENT_OTHER): Payer: Self-pay | Admitting: Physical Therapy

## 2024-01-01 DIAGNOSIS — R2689 Other abnormalities of gait and mobility: Secondary | ICD-10-CM

## 2024-01-01 DIAGNOSIS — G8929 Other chronic pain: Secondary | ICD-10-CM

## 2024-01-01 DIAGNOSIS — M25661 Stiffness of right knee, not elsewhere classified: Secondary | ICD-10-CM

## 2024-01-01 DIAGNOSIS — M6281 Muscle weakness (generalized): Secondary | ICD-10-CM
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
## 2024-01-07 ENCOUNTER — Encounter (HOSPITAL_BASED_OUTPATIENT_CLINIC_OR_DEPARTMENT_OTHER): Admitting: Physical Therapy

## 2024-01-15 ENCOUNTER — Ambulatory Visit (HOSPITAL_BASED_OUTPATIENT_CLINIC_OR_DEPARTMENT_OTHER): Admitting: Physical Therapy

## 2024-01-15 ENCOUNTER — Encounter (HOSPITAL_BASED_OUTPATIENT_CLINIC_OR_DEPARTMENT_OTHER): Payer: Self-pay | Admitting: Physical Therapy

## 2024-01-15 DIAGNOSIS — G8929 Other chronic pain: Secondary | ICD-10-CM

## 2024-01-15 DIAGNOSIS — M25661 Stiffness of right knee, not elsewhere classified: Secondary | ICD-10-CM

## 2024-01-15 DIAGNOSIS — R2689 Other abnormalities of gait and mobility: Secondary | ICD-10-CM | POA: Diagnosis not present

## 2024-01-15 DIAGNOSIS — M6281 Muscle weakness (generalized): Secondary | ICD-10-CM
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
## 2024-01-21 ENCOUNTER — Ambulatory Visit (INDEPENDENT_AMBULATORY_CARE_PROVIDER_SITE_OTHER): Admitting: Psychology

## 2024-01-21 ENCOUNTER — Ambulatory Visit (HOSPITAL_BASED_OUTPATIENT_CLINIC_OR_DEPARTMENT_OTHER): Attending: Orthopedic Surgery | Admitting: Physical Therapy

## 2024-01-21 ENCOUNTER — Encounter (HOSPITAL_BASED_OUTPATIENT_CLINIC_OR_DEPARTMENT_OTHER): Payer: Self-pay | Admitting: Physical Therapy

## 2024-01-21 DIAGNOSIS — R2689 Other abnormalities of gait and mobility: Secondary | ICD-10-CM | POA: Diagnosis present

## 2024-01-21 DIAGNOSIS — M25661 Stiffness of right knee, not elsewhere classified: Secondary | ICD-10-CM | POA: Diagnosis present

## 2024-01-21 DIAGNOSIS — F4322 Adjustment disorder with anxiety: Secondary | ICD-10-CM | POA: Diagnosis not present

## 2024-01-21 DIAGNOSIS — M6281 Muscle weakness (generalized): Secondary | ICD-10-CM | POA: Insufficient documentation

## 2024-01-21 DIAGNOSIS — G8929 Other chronic pain: Secondary | ICD-10-CM | POA: Diagnosis present

## 2024-01-21 DIAGNOSIS — M25561 Pain in right knee: Secondary | ICD-10-CM | POA: Diagnosis present
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
## 2024-01-21 NOTE — Progress Notes (Signed)
 [facility] Counselor/Therapist Progress Note  Patient ID: ADDALEE KAVANAGH, MRN: [id number],    Date: 01/21/2024  Time Spent: 5:00pm-5:45pm   45 minutes   Treatment Type: Individual Therapy  Reported Symptoms: stress, anxiety  Mental Status Exam: Appearance:  Casual     Behavior: Appropriate  Motor: Normal  Speech/Language:  Normal Rate  Affect: Appropriate  Mood: normal  Thought process: normal  Thought content:   WNL  Sensory/Perceptual disturbances:   WNL  Orientation: oriented to person, place, time/date, and situation  Attention: Good  Concentration: Good  Memory: WNL  Fund of knowledge:  Good  Insight:   Good  Judgment:  Good  Impulse Control: Good   Risk Assessment: Danger to Self:  No Self-injurious Behavior: No Danger to Others: No Duty to Warn:no Physical Aggression / Violence:No  Access to Firearms a concern: No  Gang Involvement:No   Subjective: Pt present for face-to-face individual therapy via video.  Pt consents to telehealth video session and is aware of limitations and benefits of virtual sessions.   Location of pt: home Location of therapist: home office.   Pt talked about work.  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   Worked on calming strategies.  Pt has used the self affirming mantras we identified in therapy.   She has told herself  I can do hard things.   Pt has been working on not letting her anxiety control what she chooses to do.  She nudges her self to do things even if anxious so she won't miss out.     Pt talked about getting anxious at times when she drives.  Worked on calming strategies.  Worked on self care strategies.   Provided supportive therapy.  Interventions: Cognitive Behavioral Therapy and Insight-Oriented  Diagnosis:  F43.22  Plan of Care: Recommended ongoing therapy.  Pt participated in setting treatment goals.   Plan to meet monthly.  Pt agrees with treatment plan.    Treatment  Plan (Treatment Plan target date:  03/17/2024) Client Abilities/Strengths  Pt is bright, engaging and motivated for therapy.  Client Treatment Preferences  Individual therapy.  Client Statement of Needs  Improve coping skills.  Symptoms  Autonomic hyperactivity (e.g., palpitations, shortness of breath, dry mouth, trouble swallowing, nausea, diarrhea). Excessive and/or unrealistic worry that is difficult to control occurring more days than not for at least 6 months about a number of events or activities. Hypervigilance (e.g., feeling constantly on edge, experiencing concentration difficulties, having trouble falling or staying asleep, exhibiting a general state of irritability). Motor tension (e.g., restlessness, tiredness, shakiness, muscle tension). Problems Addressed  Anxiety Goals 1. Enhance ability to effectively cope with the full variety of life's worries and anxieties. 2. Learn and implement coping skills that result in a reduction of anxiety and worry, and improved daily functioning. Objective Learn to accept limitations in life and commit to tolerating, rather than avoiding, unpleasant emotions while accomplishing meaningful goals. Target Date: 2024-03-17  Frequency: Monthly Progress: 60 Modality: individual Related Interventions 1. Use techniques from Acceptance and Commitment Therapy to help client accept uncomfortable realities such as lack of complete control, imperfections, and uncertainty and tolerate unpleasant emotions and thoughts in order to accomplish value-consistent goals. Objective Learn and implement problem-solving strategies for realistically addressing worries. Target Date: 2024-03-17  Frequency: Monthly Progress: 60 Modality: individual Related Interventions 1. Assign the client a homework exercise in which he/she problem-solves a current problem.  review, reinforce success, and provide corrective feedback toward  improvement. 2. Teach the client problem-solving  strategies involving specifically defining a problem, generating options for addressing it, evaluating the pros and cons of each option, selecting and implementing an optional action, and reevaluating and refining the action. Objective Learn and implement calming skills to reduce overall anxiety and manage anxiety symptoms. Target Date: 2024-03-17  Frequency: Monthly Progress: 60 Modality: individual Related Interventions 1. Assign the client to read about progressive muscle relaxation and other calming strategies in relevant books or treatment manuals (e.g., Progressive Relaxation Training by Thornell and Elmer; Mastery of Your Anxiety and Worry: Workbook by Richarda armin Given). 2. Assign the client homework each session in which he/she practices relaxation exercises daily, gradually applying them progressively from non-anxiety-provoking to anxiety-provoking situations; review and reinforce success while providing corrective feedback toward improvement. 3. Teach the client calming/relaxation skills (e.g., applied relaxation, progressive muscle relaxation, cue controlled relaxation; mindful breathing; biofeedback) and how to discriminate better between relaxation and tension; teach the client how to apply these skills to his/her daily life. 3. Reduce overall frequency, intensity, and duration of the anxiety so that daily functioning is not impaired. 4. Resolve the core conflict that is the source of anxiety. 5. Stabilize anxiety level while increasing ability to function on a daily basis. Diagnosis :    F43.22  Conditions For Discharge Achievement of treatment goals and objectives.  Jax Kentner, LCSW

## 2024-01-28 ENCOUNTER — Ambulatory Visit (HOSPITAL_BASED_OUTPATIENT_CLINIC_OR_DEPARTMENT_OTHER): Admitting: Physical Therapy

## 2024-01-28 ENCOUNTER — Encounter (HOSPITAL_BASED_OUTPATIENT_CLINIC_OR_DEPARTMENT_OTHER): Payer: Self-pay | Admitting: Physical Therapy

## 2024-01-28 DIAGNOSIS — R2689 Other abnormalities of gait and mobility: Secondary | ICD-10-CM

## 2024-01-28 DIAGNOSIS — M6281 Muscle weakness (generalized): Secondary | ICD-10-CM

## 2024-01-28 DIAGNOSIS — M25661 Stiffness of right knee, not elsewhere classified: Secondary | ICD-10-CM

## 2024-01-28 DIAGNOSIS — G8929 Other chronic pain: Secondary | ICD-10-CM
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
## 2024-01-29 ENCOUNTER — Encounter (HOSPITAL_BASED_OUTPATIENT_CLINIC_OR_DEPARTMENT_OTHER): Payer: Self-pay | Admitting: Physical Therapy

## 2024-02-04 ENCOUNTER — Encounter (HOSPITAL_BASED_OUTPATIENT_CLINIC_OR_DEPARTMENT_OTHER): Payer: Self-pay | Admitting: Physical Therapy

## 2024-02-04 ENCOUNTER — Ambulatory Visit (HOSPITAL_BASED_OUTPATIENT_CLINIC_OR_DEPARTMENT_OTHER): Admitting: Physical Therapy

## 2024-02-04 DIAGNOSIS — M6281 Muscle weakness (generalized): Secondary | ICD-10-CM

## 2024-02-04 DIAGNOSIS — G8929 Other chronic pain: Secondary | ICD-10-CM

## 2024-02-04 DIAGNOSIS — M25661 Stiffness of right knee, not elsewhere classified: Secondary | ICD-10-CM

## 2024-02-04 DIAGNOSIS — R2689 Other abnormalities of gait and mobility: Secondary | ICD-10-CM | POA: Diagnosis not present
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
## 2024-02-05 ENCOUNTER — Encounter (HOSPITAL_BASED_OUTPATIENT_CLINIC_OR_DEPARTMENT_OTHER): Payer: Self-pay | Admitting: Physical Therapy

## 2024-02-12 ENCOUNTER — Encounter (HOSPITAL_BASED_OUTPATIENT_CLINIC_OR_DEPARTMENT_OTHER): Payer: Self-pay | Admitting: Physical Therapy

## 2024-02-12 ENCOUNTER — Ambulatory Visit (HOSPITAL_BASED_OUTPATIENT_CLINIC_OR_DEPARTMENT_OTHER): Admitting: Physical Therapy

## 2024-02-12 DIAGNOSIS — R2689 Other abnormalities of gait and mobility: Secondary | ICD-10-CM | POA: Diagnosis not present

## 2024-02-12 DIAGNOSIS — M6281 Muscle weakness (generalized): Secondary | ICD-10-CM

## 2024-02-12 DIAGNOSIS — M25661 Stiffness of right knee, not elsewhere classified: Secondary | ICD-10-CM

## 2024-02-12 DIAGNOSIS — G8929 Other chronic pain: Secondary | ICD-10-CM
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
## 2024-02-13 ENCOUNTER — Encounter (HOSPITAL_BASED_OUTPATIENT_CLINIC_OR_DEPARTMENT_OTHER): Payer: Self-pay | Admitting: Physical Therapy

## 2024-02-18 ENCOUNTER — Ambulatory Visit (INDEPENDENT_AMBULATORY_CARE_PROVIDER_SITE_OTHER): Admitting: Psychology

## 2024-02-18 DIAGNOSIS — F4322 Adjustment disorder with anxiety: Secondary | ICD-10-CM

## 2024-02-18 NOTE — Progress Notes (Unsigned)
 [facility] Counselor/Therapist Progress Note  Patient ID: Alyssa Lopez, MRN: [id number],    Date: 02/18/2024  Time Spent: 5:00pm-5:45pm   45 minutes   Treatment Type: Individual Therapy  Reported Symptoms: stress, anxiety  Mental Status Exam: Appearance:  Casual     Behavior: Appropriate  Motor: Normal  Speech/Language:  Normal Rate  Affect: Appropriate  Mood: normal  Thought process: normal  Thought content:   WNL  Sensory/Perceptual disturbances:   WNL  Orientation: oriented to person, place, time/date, and situation  Attention: Good  Concentration: Good  Memory: WNL  Fund of knowledge:  Good  Insight:   Good  Judgment:  Good  Impulse Control: Good   Risk Assessment: Danger to Self:  No Self-injurious Behavior: No Danger to Others: No Duty to Warn:no Physical Aggression / Violence:No  Access to Firearms a concern: No  Gang Involvement:No   Subjective: Pt present for face-to-face individual therapy via video.  Pt consents to telehealth video session and is aware of limitations and benefits of virtual sessions.   Location of pt: home Location of therapist: home office.   Pt talked about feeling really exhausted lately.   She has been on edge and anxious bc of having a lot going on at work and home.  She has felt her stress levels rise.  Worked on Optician, dispensing.   Pt has not been going to the gym bc she has had knee pain.  Pt wants to get back into exercise but it is difficult.   She knows she needs the exercise to help with stress management and to do her PT.  Pt may need to have surgery again on her knee.  She is dreading that.  She does  Pt talked about work.  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   Worked on calming strategies.  Pt has used the self affirming mantras we identified in therapy.   She has told herself  I can do hard things.   Pt has been working on not letting her anxiety control what she chooses to do.   She nudges her self to do things even if anxious so she won't miss out.     Pt talked about getting anxious at times when she drives.  Worked on calming strategies.  Worked on self care strategies.   Provided supportive therapy.  Interventions: Cognitive Behavioral Therapy and Insight-Oriented  Diagnosis:  F43.22  Plan of Care: Recommended ongoing therapy.  Pt participated in setting treatment goals.   Plan to meet monthly.  Pt agrees with treatment plan.    Treatment Plan (Treatment Plan target date:  03/17/2024) Client Abilities/Strengths  Pt is bright, engaging and motivated for therapy.  Client Treatment Preferences  Individual therapy.  Client Statement of Needs  Improve coping skills.  Symptoms  Autonomic hyperactivity (e.g., palpitations, shortness of breath, dry mouth, trouble swallowing, nausea, diarrhea). Excessive and/or unrealistic worry that is difficult to control occurring more days than not for at least 6 months about a number of events or activities. Hypervigilance (e.g., feeling constantly on edge, experiencing concentration difficulties, having trouble falling or staying asleep, exhibiting a general state of irritability). Motor tension (e.g., restlessness, tiredness, shakiness, muscle tension). Problems Addressed  Anxiety Goals 1. Enhance ability to effectively cope with the full variety of life's worries and anxieties. 2. Learn and implement coping skills that result in a reduction of anxiety and worry, and improved daily functioning. Objective Learn to accept limitations in  life and commit to tolerating, rather than avoiding, unpleasant emotions while accomplishing meaningful goals. Target Date: 2024-03-17  Frequency: Monthly Progress: 60 Modality: individual Related Interventions 1. Use techniques from Acceptance and Commitment Therapy to help client accept uncomfortable realities such as lack of complete control, imperfections, and uncertainty and tolerate  unpleasant emotions and thoughts in order to accomplish value-consistent goals. Objective Learn and implement problem-solving strategies for realistically addressing worries. Target Date: 2024-03-17  Frequency: Monthly Progress: 60 Modality: individual Related Interventions 1. Assign the client a homework exercise in which he/she problem-solves a current problem.  review, reinforce success, and provide corrective feedback toward improvement. 2. Teach the client problem-solving strategies involving specifically defining a problem, generating options for addressing it, evaluating the pros and cons of each option, selecting and implementing an optional action, and reevaluating and refining the action. Objective Learn and implement calming skills to reduce overall anxiety and manage anxiety symptoms. Target Date: 2024-03-17  Frequency: Monthly Progress: 60 Modality: individual Related Interventions 1. Assign the client to read about progressive muscle relaxation and other calming strategies in relevant books or treatment manuals (e.g., Progressive Relaxation Training by Thornell and Elmer; Mastery of Your Anxiety and Worry: Workbook by Richarda armin Given). 2. Assign the client homework each session in which he/she practices relaxation exercises daily, gradually applying them progressively from non-anxiety-provoking to anxiety-provoking situations; review and reinforce success while providing corrective feedback toward improvement. 3. Teach the client calming/relaxation skills (e.g., applied relaxation, progressive muscle relaxation, cue controlled relaxation; mindful breathing; biofeedback) and how to discriminate better between relaxation and tension; teach the client how to apply these skills to his/her daily life. 3. Reduce overall frequency, intensity, and duration of the anxiety so that daily functioning is not impaired. 4. Resolve the core conflict that is the source of anxiety. 5. Stabilize  anxiety level while increasing ability to function on a daily basis. Diagnosis :    F43.22  Conditions For Discharge Achievement of treatment goals and objectives.  Shaguana Love, LCSW

## 2024-03-03 ENCOUNTER — Ambulatory Visit (HOSPITAL_BASED_OUTPATIENT_CLINIC_OR_DEPARTMENT_OTHER): Attending: Orthopedic Surgery | Admitting: Physical Therapy

## 2024-03-03 ENCOUNTER — Encounter (HOSPITAL_BASED_OUTPATIENT_CLINIC_OR_DEPARTMENT_OTHER): Payer: Self-pay | Admitting: Physical Therapy

## 2024-03-03 DIAGNOSIS — G8929 Other chronic pain: Secondary | ICD-10-CM | POA: Insufficient documentation

## 2024-03-03 DIAGNOSIS — M25661 Stiffness of right knee, not elsewhere classified: Secondary | ICD-10-CM | POA: Diagnosis present

## 2024-03-03 DIAGNOSIS — R2689 Other abnormalities of gait and mobility: Secondary | ICD-10-CM | POA: Diagnosis present

## 2024-03-03 DIAGNOSIS — M25561 Pain in right knee: Secondary | ICD-10-CM | POA: Insufficient documentation

## 2024-03-03 DIAGNOSIS — M6281 Muscle weakness (generalized): Secondary | ICD-10-CM | POA: Diagnosis present
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
## 2024-03-04 ENCOUNTER — Encounter (HOSPITAL_BASED_OUTPATIENT_CLINIC_OR_DEPARTMENT_OTHER): Payer: Self-pay | Admitting: Physical Therapy

## 2024-03-19 ENCOUNTER — Encounter (HOSPITAL_BASED_OUTPATIENT_CLINIC_OR_DEPARTMENT_OTHER): Payer: Self-pay | Admitting: Physical Therapy

## 2024-03-19 ENCOUNTER — Ambulatory Visit (HOSPITAL_BASED_OUTPATIENT_CLINIC_OR_DEPARTMENT_OTHER): Attending: Orthopedic Surgery | Admitting: Physical Therapy

## 2024-03-19 DIAGNOSIS — R2689 Other abnormalities of gait and mobility: Secondary | ICD-10-CM | POA: Diagnosis present

## 2024-03-19 DIAGNOSIS — M6281 Muscle weakness (generalized): Secondary | ICD-10-CM | POA: Insufficient documentation

## 2024-03-19 DIAGNOSIS — G8929 Other chronic pain: Secondary | ICD-10-CM | POA: Diagnosis present

## 2024-03-19 DIAGNOSIS — M25661 Stiffness of right knee, not elsewhere classified: Secondary | ICD-10-CM | POA: Insufficient documentation

## 2024-03-19 DIAGNOSIS — M25561 Pain in right knee: Secondary | ICD-10-CM | POA: Insufficient documentation
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
## 2024-03-24 ENCOUNTER — Ambulatory Visit: Admitting: Psychology

## 2024-03-24 DIAGNOSIS — F4322 Adjustment disorder with anxiety: Secondary | ICD-10-CM
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
## 2024-04-20 ENCOUNTER — Encounter: Payer: Self-pay | Admitting: Radiology

## 2024-04-20 ENCOUNTER — Ambulatory Visit: Admitting: Psychology

## 2024-04-20 DIAGNOSIS — F4322 Adjustment disorder with anxiety: Secondary | ICD-10-CM
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
## 2024-04-22 ENCOUNTER — Other Ambulatory Visit (INDEPENDENT_AMBULATORY_CARE_PROVIDER_SITE_OTHER): Payer: Self-pay

## 2024-04-22 ENCOUNTER — Ambulatory Visit: Admitting: Orthopedic Surgery

## 2024-04-22 DIAGNOSIS — M25561 Pain in right knee: Secondary | ICD-10-CM

## 2024-04-22 DIAGNOSIS — M1711 Unilateral primary osteoarthritis, right knee: Secondary | ICD-10-CM
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
## 2024-04-24 ENCOUNTER — Encounter: Payer: Self-pay | Admitting: Orthopedic Surgery

## 2024-04-26 ENCOUNTER — Encounter: Payer: Self-pay | Admitting: Orthopedic Surgery

## 2024-04-26 MED ORDER — TRIAMCINOLONE ACETONIDE 40 MG/ML IJ SUSP
40.0000 mg | INTRAMUSCULAR | Status: AC | PRN
Start: 2024-04-22 — End: 2024-04-22
  Administered 2024-04-22: 40 mg via INTRA_ARTICULAR

## 2024-04-26 MED ORDER — LIDOCAINE HCL 1 % IJ SOLN
5.0000 mL | INTRAMUSCULAR | Status: AC | PRN
Start: 2024-04-22 — End: 2024-04-22
  Administered 2024-04-22: 5 mL

## 2024-04-26 MED ORDER — BUPIVACAINE HCL 0.25 % IJ SOLN
4.0000 mL | INTRAMUSCULAR | Status: AC | PRN
Start: 2024-04-22 — End: 2024-04-22
  Administered 2024-04-22: 4 mL via INTRA_ARTICULAR

## 2024-04-29 ENCOUNTER — Ambulatory Visit
Admission: RE | Admit: 2024-04-29 | Discharge: 2024-04-29 | Disposition: A | Discharge status: Home / Self Care | Source: Ambulatory Visit | Attending: Orthopedic Surgery | Admitting: Orthopedic Surgery

## 2024-04-29 DIAGNOSIS — M25561 Pain in right knee: Secondary | ICD-10-CM

## 2024-04-30 ENCOUNTER — Ambulatory Visit: Payer: Self-pay | Admitting: Orthopedic Surgery

## 2024-05-04 NOTE — Progress Notes (Signed)
 I called her.  Left message on machine.  In general fracture looks healed.  She may do well with arthroscopy with lateral patellofemoral ligament release and hardware removal.  Will talk about that in December.

## 2024-05-19 ENCOUNTER — Ambulatory Visit: Admitting: Psychology

## 2024-05-19 DIAGNOSIS — F4322 Adjustment disorder with anxiety: Secondary | ICD-10-CM | POA: Diagnosis not present
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
## 2024-06-03 ENCOUNTER — Encounter: Payer: Self-pay | Admitting: Orthopedic Surgery

## 2024-06-03 ENCOUNTER — Ambulatory Visit: Admitting: Orthopedic Surgery

## 2024-06-03 DIAGNOSIS — M1711 Unilateral primary osteoarthritis, right knee: Secondary | ICD-10-CM | POA: Diagnosis not present
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
## 2024-06-25 ENCOUNTER — Ambulatory Visit: Admitting: Psychology

## 2024-06-25 DIAGNOSIS — F4322 Adjustment disorder with anxiety: Secondary | ICD-10-CM
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
## 2024-07-23 ENCOUNTER — Ambulatory Visit: Admitting: Psychology

## 2024-07-23 DIAGNOSIS — F4322 Adjustment disorder with anxiety: Secondary | ICD-10-CM
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
## 2024-07-24 ENCOUNTER — Encounter: Admitting: Orthopedic Surgery

## 2024-08-10 ENCOUNTER — Encounter: Admitting: Orthopedic Surgery

## 2024-08-21 ENCOUNTER — Encounter: Admitting: Orthopedic Surgery

## 2024-08-24 ENCOUNTER — Ambulatory Visit: Admitting: Psychology
